# Patient Record
Sex: Female | Born: 1945 | Race: White | Hispanic: No | Marital: Married | Smoking: Former smoker
Health system: Southern US, Community
[De-identification: ages and names within clinical notes are randomized; demographics above are authoritative.]

## PROBLEM LIST (undated history)

## (undated) DIAGNOSIS — R7309 Other abnormal glucose: Secondary | ICD-10-CM

## (undated) DIAGNOSIS — R7303 Prediabetes: Secondary | ICD-10-CM

## (undated) DIAGNOSIS — E785 Hyperlipidemia, unspecified: Secondary | ICD-10-CM

## (undated) DIAGNOSIS — K219 Gastro-esophageal reflux disease without esophagitis: Secondary | ICD-10-CM

## (undated) DIAGNOSIS — I10 Essential (primary) hypertension: Secondary | ICD-10-CM

## (undated) DIAGNOSIS — E559 Vitamin D deficiency, unspecified: Secondary | ICD-10-CM

## (undated) DIAGNOSIS — N289 Disorder of kidney and ureter, unspecified: Secondary | ICD-10-CM

## (undated) DIAGNOSIS — E039 Hypothyroidism, unspecified: Secondary | ICD-10-CM

## (undated) HISTORY — PX: EYE SURGERY: SHX253

## (undated) HISTORY — DX: Hypothyroidism, unspecified: E03.9

## (undated) HISTORY — DX: Disorder of kidney and ureter, unspecified: N28.9

## (undated) HISTORY — DX: Gastro-esophageal reflux disease without esophagitis: K21.9

## (undated) HISTORY — PX: ABDOMINAL HYSTERECTOMY: SHX81

## (undated) HISTORY — DX: Hyperlipidemia, unspecified: E78.5

## (undated) HISTORY — DX: Essential (primary) hypertension: I10

## (undated) HISTORY — DX: Prediabetes: R73.03

## (undated) HISTORY — PX: NASAL SEPTUM SURGERY: SHX37

## (undated) HISTORY — DX: Other abnormal glucose: R73.09

## (undated) HISTORY — PX: TUBAL LIGATION: SHX77

## (undated) HISTORY — DX: Vitamin D deficiency, unspecified: E55.9

---

## 1997-03-26 ENCOUNTER — Ambulatory Visit (HOSPITAL_COMMUNITY): Admission: RE | Admit: 1997-03-26 | Discharge: 1997-03-26 | Payer: Self-pay | Admitting: Internal Medicine

## 1998-02-18 ENCOUNTER — Other Ambulatory Visit: Admission: RE | Admit: 1998-02-18 | Discharge: 1998-02-18 | Payer: Self-pay | Admitting: Obstetrics & Gynecology

## 1998-07-16 ENCOUNTER — Ambulatory Visit (HOSPITAL_COMMUNITY): Admission: RE | Admit: 1998-07-16 | Discharge: 1998-07-16 | Payer: Self-pay | Admitting: Internal Medicine

## 1998-07-24 ENCOUNTER — Encounter: Payer: Self-pay | Admitting: Internal Medicine

## 1998-07-24 ENCOUNTER — Ambulatory Visit (HOSPITAL_COMMUNITY): Admission: RE | Admit: 1998-07-24 | Discharge: 1998-07-24 | Payer: Self-pay | Admitting: Internal Medicine

## 1999-01-06 HISTORY — PX: CARPAL TUNNEL RELEASE: SHX101

## 1999-01-10 ENCOUNTER — Ambulatory Visit (HOSPITAL_BASED_OUTPATIENT_CLINIC_OR_DEPARTMENT_OTHER): Admission: RE | Admit: 1999-01-10 | Discharge: 1999-01-10 | Payer: Self-pay | Admitting: Orthopedic Surgery

## 2000-06-28 ENCOUNTER — Encounter: Payer: Self-pay | Admitting: Internal Medicine

## 2000-06-28 ENCOUNTER — Ambulatory Visit (HOSPITAL_COMMUNITY): Admission: RE | Admit: 2000-06-28 | Discharge: 2000-06-28 | Payer: Self-pay | Admitting: Internal Medicine

## 2003-10-25 ENCOUNTER — Ambulatory Visit (HOSPITAL_COMMUNITY): Admission: RE | Admit: 2003-10-25 | Discharge: 2003-10-25 | Payer: Self-pay | Admitting: Internal Medicine

## 2005-03-18 ENCOUNTER — Ambulatory Visit (HOSPITAL_COMMUNITY): Admission: RE | Admit: 2005-03-18 | Discharge: 2005-03-18 | Payer: Self-pay | Admitting: Internal Medicine

## 2006-06-10 ENCOUNTER — Ambulatory Visit: Payer: Self-pay | Admitting: Internal Medicine

## 2006-06-24 ENCOUNTER — Ambulatory Visit: Payer: Self-pay | Admitting: Internal Medicine

## 2006-06-24 LAB — HM COLONOSCOPY

## 2008-03-08 ENCOUNTER — Ambulatory Visit (HOSPITAL_COMMUNITY): Admission: RE | Admit: 2008-03-08 | Discharge: 2008-03-08 | Payer: Self-pay | Admitting: Internal Medicine

## 2008-03-20 ENCOUNTER — Ambulatory Visit (HOSPITAL_COMMUNITY): Admission: RE | Admit: 2008-03-20 | Discharge: 2008-03-20 | Payer: Self-pay | Admitting: Internal Medicine

## 2010-05-23 NOTE — Op Note (Signed)
. [facility]  Patient:    Stacy Wallace                        MRN: [id number] Proc. Date: 01/10/99 Adm. Date:  [id number] Attending:  Ronne Binning CC:         Nicki Reaper, M.D. (2)                           Operative Report  PREOPERATIVE DIAGNOSIS:  Carpal tunnel syndrome, right hand.  POSTOPERATIVE DIAGNOSIS:  Carpal tunnel syndrome, right hand.  OPERATION:  Decompression right median nerve.  SURGEON:  Nicki Reaper, M.D.  ASSISTANT:  RN  ANESTHESIA:  Forearm based IV regional.  ANESTHESIOLOGIST:  Katrinka Blazing.  HISTORY:  The patient is a 65 year old female with a history of a carpal tunnel  syndrome.   EMG nerve conduction is positive which have not responded to conservative treatment.  PROCEDURE:  The patient was brought to the operating room where a forearm based IV regional anesthetic was carried out without difficulty.  She was prepped and draped using Betadine scrub and solution with the right arm free.  A longitudinal incision was made in the palm and carried down through subcutaneous tissue.  Bleeders were electrocauterized.  Palmar fascia was split.  Superficial palmar arch identified. The flexor tendon of the ring and little finger identified.  To the ulnar side of the median nerve the carpal retinaculum was incised with sharp dissection.  A right angle and Sellor retractor were placed between skin and forearm fascia.  The fascia was released for approximately 3 cm proximal to the  wrist crease under direct vision.  No further lesions were identified.  The wound was irrigated and closed with interrupted 5-0 nylon sutures.  A sterile compressive dressing and splint was applied.  The patient tolerated the procedure well and as taken to the recovery room for observation in satisfactory condition.  She is discharged home to return to the hand center in [place] in 1 week on Vicodin and Keflex. DD:  01/10/99 TD:   01/10/99 Job: [id number]

## 2011-03-25 ENCOUNTER — Other Ambulatory Visit (HOSPITAL_COMMUNITY): Payer: Self-pay | Admitting: Internal Medicine

## 2011-03-25 DIAGNOSIS — Z780 Asymptomatic menopausal state: Secondary | ICD-10-CM

## 2011-03-25 DIAGNOSIS — Z1231 Encounter for screening mammogram for malignant neoplasm of breast: Secondary | ICD-10-CM

## 2011-04-06 LAB — HM DEXA SCAN

## 2011-04-20 ENCOUNTER — Ambulatory Visit (HOSPITAL_COMMUNITY)
Admission: RE | Admit: 2011-04-20 | Discharge: 2011-04-20 | Disposition: A | Discharge status: Home / Self Care | Payer: Medicare Other | Source: Ambulatory Visit | Attending: Internal Medicine | Admitting: Internal Medicine

## 2011-04-20 ENCOUNTER — Other Ambulatory Visit (HOSPITAL_COMMUNITY): Payer: Self-pay | Admitting: Internal Medicine

## 2011-04-20 DIAGNOSIS — Z1231 Encounter for screening mammogram for malignant neoplasm of breast: Secondary | ICD-10-CM

## 2011-04-20 DIAGNOSIS — Z780 Asymptomatic menopausal state: Secondary | ICD-10-CM

## 2011-04-20 DIAGNOSIS — I10 Essential (primary) hypertension: Secondary | ICD-10-CM

## 2011-04-20 DIAGNOSIS — R062 Wheezing: Secondary | ICD-10-CM | POA: Insufficient documentation

## 2012-05-02 LAB — HM DIABETES FOOT EXAM

## 2012-06-02 ENCOUNTER — Other Ambulatory Visit (HOSPITAL_COMMUNITY): Payer: Self-pay | Admitting: Internal Medicine

## 2012-06-02 DIAGNOSIS — Z1231 Encounter for screening mammogram for malignant neoplasm of breast: Secondary | ICD-10-CM

## 2012-06-03 ENCOUNTER — Ambulatory Visit (HOSPITAL_COMMUNITY)
Admission: RE | Admit: 2012-06-03 | Discharge: 2012-06-03 | Disposition: A | Discharge status: Home / Self Care | Payer: Medicare Other | Source: Ambulatory Visit | Attending: Internal Medicine | Admitting: Internal Medicine

## 2012-06-03 DIAGNOSIS — Z1231 Encounter for screening mammogram for malignant neoplasm of breast: Secondary | ICD-10-CM | POA: Insufficient documentation

## 2012-06-06 ENCOUNTER — Other Ambulatory Visit: Payer: Self-pay | Admitting: Internal Medicine

## 2012-06-06 DIAGNOSIS — R928 Other abnormal and inconclusive findings on diagnostic imaging of breast: Secondary | ICD-10-CM

## 2012-06-17 ENCOUNTER — Ambulatory Visit
Admission: RE | Admit: 2012-06-17 | Discharge: 2012-06-17 | Disposition: A | Discharge status: Home / Self Care | Payer: Medicare Other | Source: Ambulatory Visit | Attending: Internal Medicine | Admitting: Internal Medicine

## 2012-06-17 DIAGNOSIS — R928 Other abnormal and inconclusive findings on diagnostic imaging of breast: Secondary | ICD-10-CM

## 2012-06-17 LAB — HM MAMMOGRAPHY

## 2013-03-19 ENCOUNTER — Encounter: Payer: Self-pay | Admitting: Emergency Medicine

## 2013-03-20 ENCOUNTER — Encounter: Payer: Self-pay | Admitting: Emergency Medicine

## 2013-03-20 ENCOUNTER — Ambulatory Visit (INDEPENDENT_AMBULATORY_CARE_PROVIDER_SITE_OTHER): Payer: Commercial Managed Care - HMO | Admitting: Emergency Medicine

## 2013-03-20 ENCOUNTER — Ambulatory Visit
Admission: RE | Admit: 2013-03-20 | Discharge: 2013-03-20 | Disposition: A | Discharge status: Home / Self Care | Payer: Commercial Managed Care - HMO | Source: Ambulatory Visit | Attending: Emergency Medicine | Admitting: Emergency Medicine

## 2013-03-20 VITALS — BP 136/84 | HR 62 | Temp 98.0°F | Resp 18 | Ht 61.0 in | Wt 127.0 lb

## 2013-03-20 DIAGNOSIS — R7309 Other abnormal glucose: Secondary | ICD-10-CM

## 2013-03-20 DIAGNOSIS — I10 Essential (primary) hypertension: Secondary | ICD-10-CM

## 2013-03-20 DIAGNOSIS — K219 Gastro-esophageal reflux disease without esophagitis: Secondary | ICD-10-CM

## 2013-03-20 DIAGNOSIS — M25569 Pain in unspecified knee: Secondary | ICD-10-CM

## 2013-03-20 DIAGNOSIS — E785 Hyperlipidemia, unspecified: Secondary | ICD-10-CM

## 2013-03-20 DIAGNOSIS — E039 Hypothyroidism, unspecified: Secondary | ICD-10-CM

## 2013-03-20 DIAGNOSIS — E559 Vitamin D deficiency, unspecified: Secondary | ICD-10-CM

## 2013-03-20 DIAGNOSIS — E782 Mixed hyperlipidemia: Secondary | ICD-10-CM

## 2013-03-20 LAB — CBC WITH DIFFERENTIAL/PLATELET
BASOS ABS: 0 10*3/uL (ref 0.0–0.1)
BASOS PCT: 0 % (ref 0–1)
EOS ABS: 0.2 10*3/uL (ref 0.0–0.7)
Eosinophils Relative: 3 % (ref 0–5)
HCT: 40.2 % (ref 36.0–46.0)
Hemoglobin: 13.6 g/dL (ref 12.0–15.0)
Lymphocytes Relative: 46 % (ref 12–46)
Lymphs Abs: 2.4 10*3/uL (ref 0.7–4.0)
MCH: 30.1 pg (ref 26.0–34.0)
MCHC: 33.8 g/dL (ref 30.0–36.0)
MCV: 88.9 fL (ref 78.0–100.0)
Monocytes Absolute: 0.3 10*3/uL (ref 0.1–1.0)
Monocytes Relative: 6 % (ref 3–12)
NEUTROS ABS: 2.3 10*3/uL (ref 1.7–7.7)
NEUTROS PCT: 45 % (ref 43–77)
PLATELETS: 193 10*3/uL (ref 150–400)
RBC: 4.52 MIL/uL (ref 3.87–5.11)
RDW: 13.4 % (ref 11.5–15.5)
WBC: 5.2 10*3/uL (ref 4.0–10.5)

## 2013-03-20 LAB — HEMOGLOBIN A1C
HEMOGLOBIN A1C: 6 % — AB (ref <5.7)
MEAN PLASMA GLUCOSE: 126 mg/dL — AB (ref <117)

## 2013-03-20 NOTE — Progress Notes (Signed)
Subjective:    Patient ID: Stacy Wallace, female    DOB: Feb 16, 1945, 68 y.o.   MRN: [id number]  HPI Comments: 68 yo female presents for overdue 3 month F/U for HTN, Cholesterol, Pre-Dm, D. Deficient. She has not had f/u AD. She has been off samples of Crestor x 3 months. She was taking 20 mg qod. She has been eating healthy. She has been exercising more. She has been checking BP and it has been good. LAST LABS BS 115 T 281 TG 324 L 159 A1C 5.6  D 89  She has been off thyroid Rx X 6 month. She notes she has been more tired. She said she did not think about getting it refill. She denies any symptoms off meds except may fatigue or clouded thinking.  She has Hx of fall on to left side bout 15 years ago and has noted recently had more pain/ swelling/ burning x 6- 8 months in left leg. She notes the hip has felt out of place x 15 years. She notes pain is relieved with rest and lying down.    Current Outpatient Prescriptions on File Prior to Visit  Medication Sig Dispense Refill  . aspirin 81 MG tablet Take 81 mg by mouth daily.      Marland Kitchen atenolol (TENORMIN) 100 MG tablet Take 100 mg by mouth daily.      . benazepril (LOTENSIN) 20 MG tablet Take 20 mg by mouth daily.      . Cholecalciferol (D 5000) 5000 UNITS capsule Take 5,000 Units by mouth daily.      . furosemide (LASIX) 80 MG tablet Take 80 mg by mouth.      Marland Kitchen omeprazole (PRILOSEC) 20 MG capsule Take 20 mg by mouth daily.      Marland Kitchen levothyroxine (SYNTHROID, LEVOTHROID) 50 MCG tablet Take 50 mcg by mouth daily before breakfast.      . rosuvastatin (CRESTOR) 10 MG tablet Take 10 mg by mouth daily.       No current facility-administered medications on file prior to visit.   Allergies  Allergen Reactions  . Latex   . Lopid [Gemfibrozil]     headache  . Morphine And Related   . Nickel    Past Medical History  Diagnosis Date  . Hyperlipidemia   . Hypertension   . Unspecified hypothyroidism   . Other abnormal glucose   . Unspecified vitamin  D deficiency   . GERD (gastroesophageal reflux disease)      Review of Systems  Constitutional: Positive for fatigue.  Musculoskeletal: Positive for arthralgias, gait problem, joint swelling and myalgias.  Psychiatric/Behavioral: Positive for confusion.  All other systems reviewed and are negative.   BP 136/84  Pulse 62  Temp(Src) 98 F (36.7 C) (Temporal)  Resp 18  Ht 5\' 1"  (1.549 m)  Wt 127 lb (57.607 kg)  BMI 24.01 kg/m2     Objective:   Physical Exam  Nursing note and vitals reviewed. Constitutional: She is oriented to person, place, and time. She appears well-developed and well-nourished. No distress.  HENT:  Head: Normocephalic and atraumatic.  Right Ear: External ear normal.  Left Ear: External ear normal.  Nose: Nose normal.  Mouth/Throat: Oropharynx is clear and moist.  Eyes: Conjunctivae and EOM are normal.  Neck: Normal range of motion. Neck supple. No JVD present. No thyromegaly present.  Cardiovascular: Normal rate, regular rhythm, normal heart sounds and intact distal pulses.   Pulmonary/Chest: Effort normal and breath sounds normal.  Abdominal: Soft.  Bowel sounds are normal. She exhibits no distension and no mass. There is no tenderness. There is no rebound and no guarding.  Musculoskeletal: Normal range of motion. She exhibits no edema and no tenderness.  Mild discomfort with ROM of left hip, more present when rises from seated position and initiates gait  Lymphadenopathy:    She has no cervical adenopathy.  Neurological: She is alert and oriented to person, place, and time. No cranial nerve deficit.  Skin: Skin is warm and dry. No rash noted. No erythema. No pallor.  Psychiatric: She has a normal mood and affect. Her behavior is normal. Judgment normal.  She seems mildly confused with her story telling of leg HX.           Assessment & Plan:  1.  3 month F/U for HTN, Cholesterol, Pre-Dm, D. Deficient. Needs healthy diet, cardio QD and obtain  healthy weight. Check Labs, Check BP if >130/80 call office. NONcompliance due to ? Confusion vs scheduling. SX Crestor 20 mg #42 given Take AD  2. ? Left leg pain chronic with ? Edema HX not present today- check labs/ xrays, very difficult to follow discussion with patient.  3. Hypothyroid off RX-? Confusion vs early mental status changes vs IQ level vs mood changes- Continue to monitor may need further testing, no obvious decline but noticeable difficulty with history and excessive talking

## 2013-03-20 NOTE — Patient Instructions (Signed)
Hypertension Hypertension is another name for high blood pressure. High blood pressure may mean that your heart needs to work harder to pump blood. Blood pressure consists of two numbers, which includes a higher number over a lower number (example: 110/72). HOME CARE   Make lifestyle changes as told by your doctor. This may include weight loss and exercise.  Take your blood pressure medicine every day.  Limit how much salt you use.  Stop smoking if you smoke.  Do not use drugs.  Talk to your doctor if you are using decongestants or birth control pills. These medicines might make blood pressure higher.  Females should not drink more than 1 alcoholic drink per day. Males should not drink more than 2 alcoholic drinks per day.  See your doctor as told. GET HELP RIGHT AWAY IF:   You have a blood pressure reading with a top number of 180 or higher.  You get a very bad headache.  You get blurred or changing vision.  You feel confused.  You feel weak, numb, or faint.  You get chest or belly (abdominal) pain.  You throw up (vomit).  You cannot breathe very well. MAKE SURE YOU:   Understand these instructions.  Will watch your condition.  Will get help right away if you are not doing well or get worse. Document Released: 06/10/2007 Document Revised: 03/16/2011 Document Reviewed: 06/10/2007 [facility] Patient Information 2014 [place], [place]. Sciatica with Rehab The sciatic nerve runs from the back down the leg and is responsible for sensation and control of the muscles in the back (posterior) side of the thigh, lower leg, and foot. Sciatica is a condition that is characterized by inflammation of this nerve.  SYMPTOMS   Signs of nerve damage, including numbness and/or weakness along the posterior side of the lower extremity.  Pain in the back of the thigh that may also travel down the leg.  Pain that worsens when sitting for long periods of time.  Occasionally, pain in the  back or buttock. CAUSES  Inflammation of the sciatic nerve is the cause of sciatica. The inflammation is due to something irritating the nerve. Common sources of irritation include:  Sitting for long periods of time.  Direct trauma to the nerve.  Arthritis of the spine.  Herniated or ruptured disk.  Slipping of the vertebrae (spondylolithesis)  Pressure from soft tissues, such as muscles or ligament-like tissue (fascia). RISK INCREASES WITH:  Sports that place pressure or stress on the spine (football or weightlifting).  Poor strength and flexibility.  Failure to warm-up properly before activity.  Family history of low back pain or disk disorders.  Previous back injury or surgery.  Poor body mechanics, especially when lifting, or poor posture. PREVENTION   Warm up and stretch properly before activity.  Maintain physical fitness:  Strength, flexibility, and endurance.  Cardiovascular fitness.  Learn and use proper technique, especially with posture and lifting. When possible, have coach correct improper technique.  Avoid activities that place stress on the spine. PROGNOSIS If treated properly, then sciatica usually resolves within 6 weeks. However, occasionally surgery is necessary.  RELATED COMPLICATIONS   Permanent nerve damage, including pain, numbness, tingle, or weakness.  Chronic back pain.  Risks of surgery: infection, bleeding, nerve damage, or damage to surrounding tissues. TREATMENT Treatment initially involves resting from any activities that aggravate your symptoms. The use of ice and medication may help reduce pain and inflammation. The use of strengthening and stretching exercises may help reduce pain with activity.  These exercises may be performed at home or with referral to a therapist. A therapist may recommend further treatments, such as transcutaneous electronic nerve stimulation (TENS) or ultrasound. Your caregiver may recommend corticosteroid  injections to help reduce inflammation of the sciatic nerve. If symptoms persist despite non-surgical (conservative) treatment, then surgery may be recommended. MEDICATION  If pain medication is necessary, then nonsteroidal anti-inflammatory medications, such as aspirin and ibuprofen, or other minor pain relievers, such as acetaminophen, are often recommended.  Do not take pain medication for 7 days before surgery.  Prescription pain relievers may be given if deemed necessary by your caregiver. Use only as directed and only as much as you need.  Ointments applied to the skin may be helpful.  Corticosteroid injections may be given by your caregiver. These injections should be reserved for the most serious cases, because they may only be given a certain number of times. HEAT AND COLD  Cold treatment (icing) relieves pain and reduces inflammation. Cold treatment should be applied for 10 to 15 minutes every 2 to 3 hours for inflammation and pain and immediately after any activity that aggravates your symptoms. Use ice packs or massage the area with a piece of ice (ice massage).  Heat treatment may be used prior to performing the stretching and strengthening activities prescribed by your caregiver, physical therapist, or athletic trainer. Use a heat pack or soak the injury in warm water. SEEK MEDICAL CARE IF:  Treatment seems to offer no benefit, or the condition worsens.  Any medications produce adverse side effects. EXERCISES  RANGE OF MOTION (ROM) AND STRETCHING EXERCISES - Sciatica Most people with sciatic will find that their symptoms worsen with either excessive bending forward (flexion) or arching at the low back (extension). The exercises which will help resolve your symptoms will focus on the opposite motion. Your physician, physical therapist or athletic trainer will help you determine which exercises will be most helpful to resolve your low back pain. Do not complete any exercises  without first consulting with your clinician. Discontinue any exercises which worsen your symptoms until you speak to your clinician. If you have pain, numbness or tingling which travels down into your buttocks, leg or foot, the goal of the therapy is for these symptoms to move closer to your back and eventually resolve. Occasionally, these leg symptoms will get better, but your low back pain may worsen; this is typically an indication of progress in your rehabilitation. Be certain to be very alert to any changes in your symptoms and the activities in which you participated in the 24 hours prior to the change. Sharing this information with your clinician will allow him/her to most efficiently treat your condition. These exercises may help you when beginning to rehabilitate your injury. Your symptoms may resolve with or without further involvement from your physician, physical therapist or athletic trainer. While completing these exercises, remember:   Restoring tissue flexibility helps normal motion to return to the joints. This allows healthier, less painful movement and activity.  An effective stretch should be held for at least 30 seconds.  A stretch should never be painful. You should only feel a gentle lengthening or release in the stretched tissue. FLEXION RANGE OF MOTION AND STRETCHING EXERCISES: STRETCH  Flexion, Single Knee to Chest   Lie on a firm bed or floor with both legs extended in front of you.  Keeping one leg in contact with the floor, bring your opposite knee to your chest. Hold your leg in place  by either grabbing behind your thigh or at your knee.  Pull until you feel a gentle stretch in your low back. Hold __________ seconds.  Slowly release your grasp and repeat the exercise with the opposite side. Repeat __________ times. Complete this exercise __________ times per day.  STRETCH  Flexion, Double Knee to Chest  Lie on a firm bed or floor with both legs extended in front of  you.  Keeping one leg in contact with the floor, bring your opposite knee to your chest.  Tense your stomach muscles to support your back and then lift your other knee to your chest. Hold your legs in place by either grabbing behind your thighs or at your knees.  Pull both knees toward your chest until you feel a gentle stretch in your low back. Hold __________ seconds.  Tense your stomach muscles and slowly return one leg at a time to the floor. Repeat __________ times. Complete this exercise __________ times per day.  STRETCH  Low Trunk Rotation   Lie on a firm bed or floor. Keeping your legs in front of you, bend your knees so they are both pointed toward the ceiling and your feet are flat on the floor.  Extend your arms out to the side. This will stabilize your upper body by keeping your shoulders in contact with the floor.  Gently and slowly drop both knees together to one side until you feel a gentle stretch in your low back. Hold for __________ seconds.  Tense your stomach muscles to support your low back as you bring your knees back to the starting position. Repeat the exercise to the other side. Repeat __________ times. Complete this exercise __________ times per day  EXTENSION RANGE OF MOTION AND FLEXIBILITY EXERCISES: STRETCH  Extension, Prone on Elbows  Lie on your stomach on the floor, a bed will be too soft. Place your palms about shoulder width apart and at the height of your head.  Place your elbows under your shoulders. If this is too painful, stack pillows under your chest.  Allow your body to relax so that your hips drop lower and make contact more completely with the floor.  Hold this position for __________ seconds.  Slowly return to lying flat on the floor. Repeat __________ times. Complete this exercise __________ times per day.  RANGE OF MOTION  Extension, Prone Press Ups  Lie on your stomach on the floor, a bed will be too soft. Place your palms about  shoulder width apart and at the height of your head.  Keeping your back as relaxed as possible, slowly straighten your elbows while keeping your hips on the floor. You may adjust the placement of your hands to maximize your comfort. As you gain motion, your hands will come more underneath your shoulders.  Hold this position __________ seconds.  Slowly return to lying flat on the floor. Repeat __________ times. Complete this exercise __________ times per day.  STRENGTHENING EXERCISES - Sciatica  These exercises may help you when beginning to rehabilitate your injury. These exercises should be done near your "sweet spot." This is the neutral, low-back arch, somewhere between fully rounded and fully arched, that is your least painful position. When performed in this safe range of motion, these exercises can be used for people who have either a flexion or extension based injury. These exercises may resolve your symptoms with or without further involvement from your physician, physical therapist or athletic trainer. While completing these exercises, remember:  Muscles can gain both the endurance and the strength needed for everyday activities through controlled exercises.  Complete these exercises as instructed by your physician, physical therapist or athletic trainer. Progress with the resistance and repetition exercises only as your caregiver advises.  You may experience muscle soreness or fatigue, but the pain or discomfort you are trying to eliminate should never worsen during these exercises. If this pain does worsen, stop and make certain you are following the directions exactly. If the pain is still present after adjustments, discontinue the exercise until you can discuss the trouble with your clinician. STRENGTHENING Deep Abdominals, Pelvic Tilt   Lie on a firm bed or floor. Keeping your legs in front of you, bend your knees so they are both pointed toward the ceiling and your feet are flat on  the floor.  Tense your lower abdominal muscles to press your low back into the floor. This motion will rotate your pelvis so that your tail bone is scooping upwards rather than pointing at your feet or into the floor.  With a gentle tension and even breathing, hold this position for __________ seconds. Repeat __________ times. Complete this exercise __________ times per day.  STRENGTHENING  Abdominals, Crunches   Lie on a firm bed or floor. Keeping your legs in front of you, bend your knees so they are both pointed toward the ceiling and your feet are flat on the floor. Cross your arms over your chest.  Slightly tip your chin down without bending your neck.  Tense your abdominals and slowly lift your trunk high enough to just clear your shoulder blades. Lifting higher can put excessive stress on the low back and does not further strengthen your abdominal muscles.  Control your return to the starting position. Repeat __________ times. Complete this exercise __________ times per day.  STRENGTHENING  Quadruped, Opposite UE/LE Lift  Assume a hands and knees position on a firm surface. Keep your hands under your shoulders and your knees under your hips. You may place padding under your knees for comfort.  Find your neutral spine and gently tense your abdominal muscles so that you can maintain this position. Your shoulders and hips should form a rectangle that is parallel with the floor and is not twisted.  Keeping your trunk steady, lift your right hand no higher than your shoulder and then your left leg no higher than your hip. Make sure you are not holding your breath. Hold this position __________ seconds.  Continuing to keep your abdominal muscles tense and your back steady, slowly return to your starting position. Repeat with the opposite arm and leg. Repeat __________ times. Complete this exercise __________ times per day.  STRENGTHENING  Abdominals and Quadriceps, Straight Leg Raise    Lie on a firm bed or floor with both legs extended in front of you.  Keeping one leg in contact with the floor, bend the other knee so that your foot can rest flat on the floor.  Find your neutral spine, and tense your abdominal muscles to maintain your spinal position throughout the exercise.  Slowly lift your straight leg off the floor about 6 inches for a count of 15, making sure to not hold your breath.  Still keeping your neutral spine, slowly lower your leg all the way to the floor. Repeat this exercise with each leg __________ times. Complete this exercise __________ times per day. POSTURE AND BODY MECHANICS CONSIDERATIONS - Sciatica Keeping correct posture when sitting, standing or completing your activities  will reduce the stress put on different body tissues, allowing injured tissues a chance to heal and limiting painful experiences. The following are general guidelines for improved posture. Your physician or physical therapist will provide you with any instructions specific to your needs. While reading these guidelines, remember:  The exercises prescribed by your provider will help you have the flexibility and strength to maintain correct postures.  The correct posture provides the optimal environment for your joints to work. All of your joints have less wear and tear when properly supported by a spine with good posture. This means you will experience a healthier, less painful body.  Correct posture must be practiced with all of your activities, especially prolonged sitting and standing. Correct posture is as important when doing repetitive low-stress activities (typing) as it is when doing a single heavy-load activity (lifting). RESTING POSITIONS Consider which positions are most painful for you when choosing a resting position. If you have pain with flexion-based activities (sitting, bending, stooping, squatting), choose a position that allows you to rest in a less flexed posture.  You would want to avoid curling into a fetal position on your side. If your pain worsens with extension-based activities (prolonged standing, working overhead), avoid resting in an extended position such as sleeping on your stomach. Most people will find more comfort when they rest with their spine in a more neutral position, neither too rounded nor too arched. Lying on a non-sagging bed on your side with a pillow between your knees, or on your back with a pillow under your knees will often provide some relief. Keep in mind, being in any one position for a prolonged period of time, no matter how correct your posture, can still lead to stiffness. PROPER SITTING POSTURE In order to minimize stress and discomfort on your spine, you must sit with correct posture Sitting with good posture should be effortless for a healthy body. Returning to good posture is a gradual process. Many people can work toward this most comfortably by using various supports until they have the flexibility and strength to maintain this posture on their own. When sitting with proper posture, your ears will fall over your shoulders and your shoulders will fall over your hips. You should use the back of the chair to support your upper back. Your low back will be in a neutral position, just slightly arched. You may place a small pillow or folded towel at the base of your low back for support.  When working at a desk, create an environment that supports good, upright posture. Without extra support, muscles fatigue and lead to excessive strain on joints and other tissues. Keep these recommendations in mind: CHAIR:   A chair should be able to slide under your desk when your back makes contact with the back of the chair. This allows you to work closely.  The chair's height should allow your eyes to be level with the upper part of your monitor and your hands to be slightly lower than your elbows. BODY POSITION  Your feet should make contact  with the floor. If this is not possible, use a foot rest.  Keep your ears over your shoulders. This will reduce stress on your neck and low back. INCORRECT SITTING POSTURES   If you are feeling tired and unable to assume a healthy sitting posture, do not slouch or slump. This puts excessive strain on your back tissues, causing more damage and pain. Healthier options include:  Using more support, like a  lumbar pillow.  Switching tasks to something that requires you to be upright or walking.  Talking a brief walk.  Lying down to rest in a neutral-spine position. PROLONGED STANDING WHILE SLIGHTLY LEANING FORWARD  When completing a task that requires you to lean forward while standing in one place for a long time, place either foot up on a stationary 2-4 inch high object to help maintain the best posture. When both feet are on the ground, the low back tends to lose its slight inward curve. If this curve flattens (or becomes too large), then the back and your other joints will experience too much stress, fatigue more quickly and can cause pain.  CORRECT STANDING POSTURES Proper standing posture should be assumed with all daily activities, even if they only take a few moments, like when brushing your teeth. As in sitting, your ears should fall over your shoulders and your shoulders should fall over your hips. You should keep a slight tension in your abdominal muscles to brace your spine. Your tailbone should point down to the ground, not behind your body, resulting in an over-extended swayback posture.  INCORRECT STANDING POSTURES  Common incorrect standing postures include a forward head, locked knees and/or an excessive swayback. WALKING Walk with an upright posture. Your ears, shoulders and hips should all line-up. PROLONGED ACTIVITY IN A FLEXED POSITION When completing a task that requires you to bend forward at your waist or lean over a low surface, try to find a way to stabilize 3 of 4 of your  limbs. You can place a hand or elbow on your thigh or rest a knee on the surface you are reaching across. This will provide you more stability so that your muscles do not fatigue as quickly. By keeping your knees relaxed, or slightly bent, you will also reduce stress across your low back. CORRECT LIFTING TECHNIQUES DO :   Assume a wide stance. This will provide you more stability and the opportunity to get as close as possible to the object which you are lifting.  Tense your abdominals to brace your spine; then bend at the knees and hips. Keeping your back locked in a neutral-spine position, lift using your leg muscles. Lift with your legs, keeping your back straight.  Test the weight of unknown objects before attempting to lift them.  Try to keep your elbows locked down at your sides in order get the best strength from your shoulders when carrying an object.  Always ask for help when lifting heavy or awkward objects. INCORRECT LIFTING TECHNIQUES DO NOT:   Lock your knees when lifting, even if it is a small object.  Bend and twist. Pivot at your feet or move your feet when needing to change directions.  Assume that you cannot safely pick up a paperclip without proper posture. Document Released: 12/22/2004 Document Revised: 03/16/2011 Document Reviewed: 04/05/2008 [facility] Patient Information 2014 [place], [place].

## 2013-03-21 ENCOUNTER — Other Ambulatory Visit: Payer: Self-pay | Admitting: Emergency Medicine

## 2013-03-21 ENCOUNTER — Other Ambulatory Visit: Payer: Self-pay | Admitting: *Deleted

## 2013-03-21 DIAGNOSIS — R7309 Other abnormal glucose: Secondary | ICD-10-CM | POA: Insufficient documentation

## 2013-03-21 DIAGNOSIS — E559 Vitamin D deficiency, unspecified: Secondary | ICD-10-CM | POA: Insufficient documentation

## 2013-03-21 DIAGNOSIS — I10 Essential (primary) hypertension: Secondary | ICD-10-CM | POA: Insufficient documentation

## 2013-03-21 DIAGNOSIS — E782 Mixed hyperlipidemia: Secondary | ICD-10-CM | POA: Insufficient documentation

## 2013-03-21 DIAGNOSIS — K219 Gastro-esophageal reflux disease without esophagitis: Secondary | ICD-10-CM | POA: Insufficient documentation

## 2013-03-21 DIAGNOSIS — E785 Hyperlipidemia, unspecified: Secondary | ICD-10-CM

## 2013-03-21 DIAGNOSIS — E039 Hypothyroidism, unspecified: Secondary | ICD-10-CM | POA: Insufficient documentation

## 2013-03-21 LAB — BASIC METABOLIC PANEL WITH GFR
BUN: 19 mg/dL (ref 6–23)
CO2: 32 mEq/L (ref 19–32)
Calcium: 10.1 mg/dL (ref 8.4–10.5)
Chloride: 100 mEq/L (ref 96–112)
Creat: 0.96 mg/dL (ref 0.50–1.10)
GFR, EST AFRICAN AMERICAN: 69 mL/min
GFR, EST NON AFRICAN AMERICAN: 60 mL/min
GLUCOSE: 102 mg/dL — AB (ref 70–99)
POTASSIUM: 4.4 meq/L (ref 3.5–5.3)
SODIUM: 140 meq/L (ref 135–145)

## 2013-03-21 LAB — HEPATIC FUNCTION PANEL
ALBUMIN: 4.3 g/dL (ref 3.5–5.2)
ALK PHOS: 55 U/L (ref 39–117)
ALT: 15 U/L (ref 0–35)
AST: 24 U/L (ref 0–37)
BILIRUBIN INDIRECT: 0.5 mg/dL (ref 0.2–1.2)
BILIRUBIN TOTAL: 0.6 mg/dL (ref 0.2–1.2)
Bilirubin, Direct: 0.1 mg/dL (ref 0.0–0.3)
Total Protein: 6.8 g/dL (ref 6.0–8.3)

## 2013-03-21 LAB — LIPID PANEL
CHOL/HDL RATIO: 4 ratio
Cholesterol: 249 mg/dL — ABNORMAL HIGH (ref 0–200)
HDL: 62 mg/dL (ref >39)
LDL CALC: 157 mg/dL — AB (ref 0–99)
Triglycerides: 152 mg/dL — ABNORMAL HIGH (ref <150)
VLDL: 30 mg/dL (ref 0–40)

## 2013-03-21 LAB — INSULIN, FASTING: INSULIN FASTING, SERUM: 12 u[IU]/mL (ref 3–28)

## 2013-03-21 LAB — TSH: TSH: 3.876 u[IU]/mL (ref 0.350–4.500)

## 2013-03-21 MED ORDER — OMEPRAZOLE 20 MG PO CPDR: 20.0000 mg | DELAYED_RELEASE_CAPSULE | Freq: Every day | ORAL | Status: DC

## 2013-03-21 MED ORDER — LEVOTHYROXINE SODIUM 50 MCG PO TABS: 50.0000 ug | ORAL_TABLET | Freq: Every day | ORAL | Status: DC

## 2013-03-21 MED ORDER — ATENOLOL 100 MG PO TABS: 100.0000 mg | ORAL_TABLET | Freq: Every day | ORAL | Status: DC

## 2013-03-21 MED ORDER — ROSUVASTATIN CALCIUM 10 MG PO TABS: 10.0000 mg | ORAL_TABLET | Freq: Every day | ORAL | Status: DC

## 2013-03-21 MED ORDER — FUROSEMIDE 80 MG PO TABS: 80.0000 mg | ORAL_TABLET | Freq: Every day | ORAL | Status: DC

## 2013-03-21 MED ORDER — BENAZEPRIL HCL 20 MG PO TABS: 20.0000 mg | ORAL_TABLET | Freq: Every day | ORAL | Status: DC

## 2013-03-21 MED ORDER — ALPRAZOLAM 1 MG PO TABS: 1.0000 mg | ORAL_TABLET | Freq: Every day | ORAL | Status: DC

## 2013-03-21 MED ORDER — MELOXICAM 15 MG PO TABS: ORAL_TABLET | ORAL | Status: DC

## 2013-05-08 ENCOUNTER — Encounter: Payer: Self-pay | Admitting: Internal Medicine

## 2013-05-08 ENCOUNTER — Ambulatory Visit (INDEPENDENT_AMBULATORY_CARE_PROVIDER_SITE_OTHER): Payer: Commercial Managed Care - HMO | Admitting: Internal Medicine

## 2013-05-08 VITALS — BP 134/78 | HR 68 | Temp 97.7°F | Resp 16 | Ht 60.75 in | Wt 123.2 lb

## 2013-05-08 DIAGNOSIS — E559 Vitamin D deficiency, unspecified: Secondary | ICD-10-CM

## 2013-05-08 DIAGNOSIS — I10 Essential (primary) hypertension: Secondary | ICD-10-CM

## 2013-05-08 DIAGNOSIS — Z0000 Encounter for general adult medical examination without abnormal findings: Secondary | ICD-10-CM

## 2013-05-08 DIAGNOSIS — Z1212 Encounter for screening for malignant neoplasm of rectum: Secondary | ICD-10-CM

## 2013-05-08 DIAGNOSIS — E785 Hyperlipidemia, unspecified: Secondary | ICD-10-CM

## 2013-05-08 DIAGNOSIS — Z79899 Other long term (current) drug therapy: Secondary | ICD-10-CM | POA: Insufficient documentation

## 2013-05-08 DIAGNOSIS — Z789 Other specified health status: Secondary | ICD-10-CM

## 2013-05-08 DIAGNOSIS — M109 Gout, unspecified: Secondary | ICD-10-CM

## 2013-05-08 DIAGNOSIS — G4700 Insomnia, unspecified: Secondary | ICD-10-CM

## 2013-05-08 DIAGNOSIS — Z1331 Encounter for screening for depression: Secondary | ICD-10-CM

## 2013-05-08 DIAGNOSIS — R7309 Other abnormal glucose: Secondary | ICD-10-CM

## 2013-05-08 LAB — HEMOGLOBIN A1C
HEMOGLOBIN A1C: 5.6 % (ref <5.7)
MEAN PLASMA GLUCOSE: 114 mg/dL (ref <117)

## 2013-05-08 LAB — CBC WITH DIFFERENTIAL/PLATELET
BASOS ABS: 0 10*3/uL (ref 0.0–0.1)
Basophils Relative: 0 % (ref 0–1)
EOS ABS: 0.1 10*3/uL (ref 0.0–0.7)
Eosinophils Relative: 2 % (ref 0–5)
HCT: 37.3 % (ref 36.0–46.0)
Hemoglobin: 13.1 g/dL (ref 12.0–15.0)
Lymphocytes Relative: 43 % (ref 12–46)
Lymphs Abs: 2.5 10*3/uL (ref 0.7–4.0)
MCH: 31.2 pg (ref 26.0–34.0)
MCHC: 35.1 g/dL (ref 30.0–36.0)
MCV: 88.8 fL (ref 78.0–100.0)
MONOS PCT: 7 % (ref 3–12)
Monocytes Absolute: 0.4 10*3/uL (ref 0.1–1.0)
NEUTROS PCT: 48 % (ref 43–77)
Neutro Abs: 2.8 10*3/uL (ref 1.7–7.7)
Platelets: 176 10*3/uL (ref 150–400)
RBC: 4.2 MIL/uL (ref 3.87–5.11)
RDW: 13.6 % (ref 11.5–15.5)
WBC: 5.9 10*3/uL (ref 4.0–10.5)

## 2013-05-08 MED ORDER — TRAZODONE HCL 150 MG PO TABS: ORAL_TABLET | ORAL | Status: DC

## 2013-05-08 MED ORDER — ALPRAZOLAM 1 MG PO TABS: 1.0000 mg | ORAL_TABLET | Freq: Every day | ORAL | Status: DC

## 2013-05-08 MED ORDER — ROSUVASTATIN CALCIUM 40 MG PO TABS: ORAL_TABLET | ORAL | Status: DC

## 2013-05-08 NOTE — Progress Notes (Signed)
Patient ID: Stacy Wallace, female   DOB: [birth year], 68 y.o.   MRN: [id number]   Annual Screening Comprehensive Examination  This very nice 68 y.o. MWF presents for complete physical.  Patient has been followed for HTN, Prediabetes, Hyperlipidemia, and Vitamin D Deficiency.    HTN predates since 1999. Patient's BP has been controlled and today's BP: 134/78 mmHg. Patient denies any cardiac symptoms as chest pain, palpitations, shortness of breath, dizziness or ankle swelling.   Patient's hyperlipidemia is not controlled with diet and medications. Patient denies myalgias or other medication SE's. Last Lipid panel is as below.  Lab Results  Component Value Date   CHOL 249* 03/20/2013   HDL 62 03/20/2013   LDLCALC 157* 03/20/2013   TRIG 152* 03/20/2013   CHOLHDL 4.0 03/20/2013    Patient has prediabetes with A1c 5.8% in 2011 and last A1c was 6.0% in Mar 2015. Patient denies reactive hypoglycemic symptoms, visual blurring, diabetic polys, or paresthesias.    Finally, patient has history of Vitamin D Deficiency with last vitamin D 89 in Oct 2014.  Allergies  Allergen Reactions  . Latex   . Lopid [Gemfibrozil]     headache  . Morphine And Related   . Nickel    Past Medical History  Diagnosis Date  . Hyperlipidemia   . Hypertension   . Unspecified hypothyroidism   . Other abnormal glucose   . Unspecified vitamin D deficiency   . GERD (gastroesophageal reflux disease)    Past Surgical History  Procedure Laterality Date  . Tubal ligation    . Eye surgery    . Abdominal hysterectomy    . Carpal tunnel release Bilateral 2001  . Nasal septum surgery     Family History  Problem Relation Age of Onset  . Hypertension Mother   . Hyperlipidemia Mother   . Heart disease Father   . Alcohol abuse Father   . Hyperlipidemia Father   . Hypertension Father   . Heart disease Sister   . Stroke Sister   . Depression Sister   . Heart disease Brother    History  Substance Use Topics  .  Smoking status: Former Smoker    Quit date: 03/20/1988  . Smokeless tobacco: Not on file  . Alcohol Use: Yes     Comment: rare glass of wine    ROS Constitutional: Denies fever, chills, weight loss/gain, headaches, insomnia, fatigue, night sweats, and change in appetite. Eyes: Denies redness, blurred vision, diplopia, discharge, itchy, watery eyes.  ENT: Denies discharge, congestion, post nasal drip, epistaxis, sore throat, earache, hearing loss, dental pain, Tinnitus, Vertigo, Sinus pain, snoring.  Cardio: Denies chest pain, palpitations, irregular heartbeat, syncope, dyspnea, diaphoresis, orthopnea, PND, claudication, edema Respiratory: denies cough, dyspnea, DOE, pleurisy, hoarseness, laryngitis, wheezing.  Gastrointestinal: Denies dysphagia, heartburn, reflux, water brash, pain, cramps, nausea, vomiting, bloating, diarrhea, constipation, hematemesis, melena, hematochezia, jaundice, hemorrhoids Genitourinary: Denies dysuria, frequency, urgency, nocturia, hesitancy, discharge, hematuria, flank pain Breast:Breast lumps, nipple discharge, bleeding.  Musculoskeletal: Denies arthralgia, myalgia, stiffness, Jt. Swelling, pain, limp, and strain/sprain. Skin: Denies puritis, rash, hives, warts, acne, eczema, changing in skin lesion Neuro: No weakness, tremor, incoordination, spasms, paresthesia, pain Psychiatric: Denies confusion, memory loss, sensory loss. c/o more difficulty with insomnia an desires to increase Xanax dose. Endocrine: Denies change in weight, skin, hair change, nocturia, and paresthesia, diabetic polys, visual blurring, hyper / hypo glycemic episodes.  Heme/Lymph: No excessive bleeding, bruising, enlarged lymph nodes.  Physical Exam  BP 134/78  Pulse 68  Temp(Src) 97.7  F (36.5 C) (Temporal)  Resp 16  Ht 5' 0.75" (1.543 m)  Wt 123 lb 3.2 oz (55.883 kg)  BMI 23.47 kg/m2  General Appearance: Well nourished, in no apparent distress. Eyes: PERRLA, EOMs, conjunctiva no  swelling or erythema, normal fundi and vessels. Sinuses: No frontal/maxillary tenderness ENT/Mouth: EACs patent / TMs  nl. Nares clear without erythema, swelling, mucoid exudates. Oral hygiene is good. No erythema, swelling, or exudate. Tongue normal, non-obstructing. Tonsils not swollen or erythematous. Hearing normal.  Neck: Supple, thyroid normal. No bruits, nodes or JVD. Respiratory: Respiratory effort normal.  BS equal and clear bilateral without rales, rhonci, wheezing or stridor. Cardio: Heart sounds are normal with regular rate and rhythm and no murmurs, rubs or gallops. Peripheral pulses are normal and equal bilaterally without edema. No aortic or femoral bruits. Chest: symmetric with normal excursions and percussion. Breasts: Symmetric, without lumps, nipple discharge, retractions, or fibrocystic changes.  Abdomen: Flat, soft, with bowl sounds. Nontender, no guarding, rebound, hernias, masses, or organomegaly.  Lymphatics: Non tender without lymphadenopathy.  Genitourinary:  Musculoskeletal: Full ROM all peripheral extremities, joint stability, 5/5 strength, and normal gait. Skin: Warm and dry without rashes, lesions, cyanosis, clubbing or  ecchymosis.  Neuro: Cranial nerves intact, reflexes equal bilaterally. Normal muscle tone, no cerebellar symptoms. Sensation intact.  Pysch: Awake and oriented X 3, normal affect, Insight and Judgment appropriate.  Assessment and Plan  1. Annual Screening Examination 2. Hypertension  3. Hyperlipidemia 4. Pre Diabetes 5. Vitamin D Deficiency   Continue prudent diet as discussed, weight control, BP monitoring, regular exercise, and medications. Discussed med's effects and SE's. Screening labs and tests as requested with regular follow-up as recommended.   Recommended periodic "Drug Holiday" from Xanax and given Rx Trazodone to use alternate days.

## 2013-05-08 NOTE — Patient Instructions (Signed)

## 2013-05-09 LAB — HEPATIC FUNCTION PANEL
ALT: 13 U/L (ref 0–35)
AST: 20 U/L (ref 0–37)
Albumin: 4.3 g/dL (ref 3.5–5.2)
Alkaline Phosphatase: 55 U/L (ref 39–117)
BILIRUBIN DIRECT: 0.1 mg/dL (ref 0.0–0.3)
Indirect Bilirubin: 0.5 mg/dL (ref 0.2–1.2)
TOTAL PROTEIN: 6.6 g/dL (ref 6.0–8.3)
Total Bilirubin: 0.6 mg/dL (ref 0.2–1.2)

## 2013-05-09 LAB — URINALYSIS, MICROSCOPIC ONLY
Casts: NONE SEEN
Crystals: NONE SEEN

## 2013-05-09 LAB — LIPID PANEL
CHOL/HDL RATIO: 3.3 ratio
CHOLESTEROL: 166 mg/dL (ref 0–200)
HDL: 51 mg/dL (ref >39)
LDL Cholesterol: 87 mg/dL (ref 0–99)
Triglycerides: 141 mg/dL (ref <150)
VLDL: 28 mg/dL (ref 0–40)

## 2013-05-09 LAB — MICROALBUMIN / CREATININE URINE RATIO
CREATININE, URINE: 61.2 mg/dL
MICROALB UR: 0.5 mg/dL (ref 0.00–1.89)
Microalb Creat Ratio: 8.2 mg/g (ref 0.0–30.0)

## 2013-05-09 LAB — URIC ACID: URIC ACID, SERUM: 7.3 mg/dL — AB (ref 2.4–7.0)

## 2013-05-09 LAB — BASIC METABOLIC PANEL WITH GFR
BUN: 16 mg/dL (ref 6–23)
CALCIUM: 9.4 mg/dL (ref 8.4–10.5)
CO2: 27 mEq/L (ref 19–32)
Chloride: 100 mEq/L (ref 96–112)
Creat: 1.1 mg/dL (ref 0.50–1.10)
GFR, EST AFRICAN AMERICAN: 59 mL/min — AB
GFR, Est Non African American: 51 mL/min — ABNORMAL LOW
GLUCOSE: 94 mg/dL (ref 70–99)
Potassium: 4.3 mEq/L (ref 3.5–5.3)
SODIUM: 139 meq/L (ref 135–145)

## 2013-05-09 LAB — INSULIN, FASTING: Insulin fasting, serum: 13 u[IU]/mL (ref 3–28)

## 2013-05-09 LAB — VITAMIN D 25 HYDROXY (VIT D DEFICIENCY, FRACTURES): Vit D, 25-Hydroxy: 86 ng/mL (ref 30–89)

## 2013-05-09 LAB — MAGNESIUM: Magnesium: 1.9 mg/dL (ref 1.5–2.5)

## 2013-05-09 LAB — TSH: TSH: 0.921 u[IU]/mL (ref 0.350–4.500)

## 2013-06-01 ENCOUNTER — Other Ambulatory Visit (INDEPENDENT_AMBULATORY_CARE_PROVIDER_SITE_OTHER): Payer: Commercial Managed Care - HMO

## 2013-06-01 DIAGNOSIS — Z1212 Encounter for screening for malignant neoplasm of rectum: Secondary | ICD-10-CM

## 2013-06-01 LAB — POC HEMOCCULT BLD/STL (HOME/3-CARD/SCREEN)
Card #3 Fecal Occult Blood, POC: NEGATIVE
FECAL OCCULT BLD: NEGATIVE
FECAL OCCULT BLD: NEGATIVE

## 2013-07-19 ENCOUNTER — Other Ambulatory Visit: Payer: Self-pay | Admitting: Emergency Medicine

## 2013-07-19 MED ORDER — LEVOTHYROXINE SODIUM 50 MCG PO TABS: 50.0000 ug | ORAL_TABLET | Freq: Every day | ORAL | Status: DC

## 2013-07-19 MED ORDER — BENAZEPRIL HCL 20 MG PO TABS: 20.0000 mg | ORAL_TABLET | Freq: Every day | ORAL | Status: DC

## 2013-07-19 MED ORDER — MELOXICAM 15 MG PO TABS: ORAL_TABLET | ORAL | Status: DC

## 2013-07-19 MED ORDER — ATENOLOL 100 MG PO TABS: 100.0000 mg | ORAL_TABLET | Freq: Every day | ORAL | Status: DC

## 2013-08-08 ENCOUNTER — Encounter: Payer: Self-pay | Admitting: Physician Assistant

## 2013-08-08 ENCOUNTER — Ambulatory Visit (INDEPENDENT_AMBULATORY_CARE_PROVIDER_SITE_OTHER): Payer: Commercial Managed Care - HMO | Admitting: Physician Assistant

## 2013-08-08 VITALS — BP 128/70 | HR 68 | Temp 98.3°F | Resp 16 | Ht 61.0 in | Wt 126.0 lb

## 2013-08-08 DIAGNOSIS — R7309 Other abnormal glucose: Secondary | ICD-10-CM

## 2013-08-08 DIAGNOSIS — Z789 Other specified health status: Secondary | ICD-10-CM

## 2013-08-08 DIAGNOSIS — E785 Hyperlipidemia, unspecified: Secondary | ICD-10-CM

## 2013-08-08 DIAGNOSIS — E039 Hypothyroidism, unspecified: Secondary | ICD-10-CM

## 2013-08-08 DIAGNOSIS — E2839 Other primary ovarian failure: Secondary | ICD-10-CM

## 2013-08-08 DIAGNOSIS — Z79899 Other long term (current) drug therapy: Secondary | ICD-10-CM

## 2013-08-08 DIAGNOSIS — Z0000 Encounter for general adult medical examination without abnormal findings: Secondary | ICD-10-CM

## 2013-08-08 DIAGNOSIS — Z1331 Encounter for screening for depression: Secondary | ICD-10-CM

## 2013-08-08 DIAGNOSIS — I10 Essential (primary) hypertension: Secondary | ICD-10-CM

## 2013-08-08 DIAGNOSIS — E559 Vitamin D deficiency, unspecified: Secondary | ICD-10-CM

## 2013-08-08 LAB — LIPID PANEL
CHOL/HDL RATIO: 2.7 ratio
Cholesterol: 154 mg/dL (ref 0–200)
HDL: 58 mg/dL (ref >39)
LDL CALC: 77 mg/dL (ref 0–99)
Triglycerides: 97 mg/dL (ref <150)
VLDL: 19 mg/dL (ref 0–40)

## 2013-08-08 LAB — CBC WITH DIFFERENTIAL/PLATELET
Basophils Absolute: 0 10*3/uL (ref 0.0–0.1)
Basophils Relative: 0 % (ref 0–1)
Eosinophils Absolute: 0.2 10*3/uL (ref 0.0–0.7)
Eosinophils Relative: 4 % (ref 0–5)
HEMATOCRIT: 36.4 % (ref 36.0–46.0)
Hemoglobin: 12.4 g/dL (ref 12.0–15.0)
Lymphocytes Relative: 51 % — ABNORMAL HIGH (ref 12–46)
Lymphs Abs: 2.6 10*3/uL (ref 0.7–4.0)
MCH: 29.5 pg (ref 26.0–34.0)
MCHC: 34.1 g/dL (ref 30.0–36.0)
MCV: 86.7 fL (ref 78.0–100.0)
MONO ABS: 0.4 10*3/uL (ref 0.1–1.0)
Monocytes Relative: 8 % (ref 3–12)
NEUTROS PCT: 37 % — AB (ref 43–77)
Neutro Abs: 1.9 10*3/uL (ref 1.7–7.7)
Platelets: 142 10*3/uL — ABNORMAL LOW (ref 150–400)
RBC: 4.2 MIL/uL (ref 3.87–5.11)
RDW: 13.6 % (ref 11.5–15.5)
WBC: 5.1 10*3/uL (ref 4.0–10.5)

## 2013-08-08 LAB — HEPATIC FUNCTION PANEL
ALBUMIN: 4.3 g/dL (ref 3.5–5.2)
ALT: 18 U/L (ref 0–35)
AST: 29 U/L (ref 0–37)
Alkaline Phosphatase: 49 U/L (ref 39–117)
BILIRUBIN INDIRECT: 0.6 mg/dL (ref 0.2–1.2)
Bilirubin, Direct: 0.1 mg/dL (ref 0.0–0.3)
TOTAL PROTEIN: 6.6 g/dL (ref 6.0–8.3)
Total Bilirubin: 0.7 mg/dL (ref 0.2–1.2)

## 2013-08-08 LAB — HEMOGLOBIN A1C
HEMOGLOBIN A1C: 6.1 % — AB (ref <5.7)
Mean Plasma Glucose: 128 mg/dL — ABNORMAL HIGH (ref <117)

## 2013-08-08 LAB — MAGNESIUM: MAGNESIUM: 2.1 mg/dL (ref 1.5–2.5)

## 2013-08-08 LAB — BASIC METABOLIC PANEL WITH GFR
BUN: 19 mg/dL (ref 6–23)
CO2: 31 mEq/L (ref 19–32)
CREATININE: 1.2 mg/dL — AB (ref 0.50–1.10)
Calcium: 9.2 mg/dL (ref 8.4–10.5)
Chloride: 102 mEq/L (ref 96–112)
GFR, EST AFRICAN AMERICAN: 53 mL/min — AB
GFR, Est Non African American: 46 mL/min — ABNORMAL LOW
Glucose, Bld: 91 mg/dL (ref 70–99)
POTASSIUM: 3.8 meq/L (ref 3.5–5.3)
Sodium: 139 mEq/L (ref 135–145)

## 2013-08-08 LAB — TSH: TSH: 0.719 u[IU]/mL (ref 0.350–4.500)

## 2013-08-08 NOTE — Progress Notes (Signed)
MEDICARE ANNUAL WELLNESS VISIT AND FOLLOW UP  Assessment:   1. Essential hypertension - CBC with Differential - BASIC METABOLIC PANEL WITH GFR - Hepatic function panel  2. Hypothyroidism - TSH  3. Hyperlipidemia - Lipid panel  4. PreDiabetes Discussed general issues about diabetes pathophysiology and management., Educational material distributed., Suggested low cholesterol diet., Encouraged aerobic exercise., Discussed foot care., Reminded to get yearly retinal exam. - Hemoglobin A1c - HM DIABETES FOOT EXAM  5. Vitamin D Deficiency - Vit D  25 hydroxy (rtn osteoporosis monitoring)  6. Encounter for long-term (current) use of other medications - Magnesium  7. Estrogen deficiency - DG Bone Density; Future   Plan:   During the course of the visit the patient was educated and counseled about appropriate screening and preventive services including:    Pneumococcal vaccine   Influenza vaccine  Td vaccine  Screening electrocardiogram  Screening mammography  Bone densitometry screening  Colorectal cancer screening  Diabetes screening  Glaucoma screening  Nutrition counseling   Advanced directives: given info/requested  Screening recommendations, referrals:  Vaccinations: Tdap vaccine not indicated Influenza vaccine not indicated Pneumococcal vaccine not indicated Shingles vaccine will check price Hep B vaccine not indicated  Nutrition assessed and recommended  Colonoscopy up to date Mammogram uptodate Pap smear not indicated Pelvic exam not indicated Recommended yearly ophthalmology/optometry visit for glaucoma screening and checkup Recommended yearly dental visit for hygiene and checkup Advanced directives - requested  Conditions/risks identified: BMI: Discussed weight loss, diet, and increase physical activity.  Increase physical activity: AHA recommends 150 minutes of physical activity a week.  Medications reviewed DEXA- ordered Diabetes is  at goal, not on ACE, only PREDM Urinary Incontinence is not an issue: discussed non pharmacology and pharmacology options.  Fall risk: low- discussed PT, home fall assessment, medications.    Subjective:   Stacy Wallace is a 68 y.o. female who presents for Medicare Annual Wellness Visit and 3 month follow up on hypertension, prediabetes, hyperlipidemia, vitamin D def.  Date of last medicare wellness visit is unknown.   Her blood pressure has been controlled at home, today their BP is BP: 128/70 mmHg She does workout. She denies chest pain, shortness of breath, dizziness.  She is on cholesterol medication and denies myalgias. Her cholesterol is at goal. The cholesterol last visit was:   Lab Results  Component Value Date   CHOL 166 05/08/2013   HDL 51 05/08/2013   LDLCALC 87 05/08/2013   TRIG 141 05/08/2013   CHOLHDL 3.3 05/08/2013   She has been working on diet and exercise for prediabetes, and denies paresthesia of the feet, polydipsia and polyuria. Last A1C in the office was:  Lab Results  Component Value Date   HGBA1C 5.6 05/08/2013   Patient is on Vitamin D supplement. Lab Results  Component Value Date   VD25OH 21 05/08/2013     She is on thyroid medication. Her medication was not changed last visit. Patient denies nervousness, palpitations and weight changes.  Lab Results  Component Value Date   TSH 0.921 05/08/2013  .    Names of Other Physician/Practitioners you currently use: 1. [facility]- here for primary care 2. Dr. Katy Fitch eye doctor, last visit every year 3. Dr. Burgess Estelle, dentist, last visit q 6 months 4. Dr. Carlean Purl, GI Patient Care Team: Unk Pinto, MD as PCP - General (Internal Medicine)  Medication Review Current Outpatient Prescriptions on File Prior to Visit  Medication Sig Dispense Refill  . ALPRAZolam Duanne Moron)  1 MG tablet Take 1 tablet (1 mg total) by mouth at bedtime.  90 tablet  1  . aspirin 81 MG tablet Take 81 mg by  mouth daily.      Marland Kitchen atenolol (TENORMIN) 100 MG tablet Take 1 tablet (100 mg total) by mouth daily.  90 tablet  1  . benazepril (LOTENSIN) 20 MG tablet Take 1 tablet (20 mg total) by mouth daily.  90 tablet  1  . Cholecalciferol (D 5000) 5000 UNITS capsule Take 5,000 Units by mouth daily.      . furosemide (LASIX) 80 MG tablet Take 1 tablet (80 mg total) by mouth daily.  90 tablet  1  . levothyroxine (SYNTHROID, LEVOTHROID) 50 MCG tablet Take 1 tablet (50 mcg total) by mouth daily before breakfast.  90 tablet  1  . Magnesium 500 MG TABS Take 500 mg by mouth daily.      . meloxicam (MOBIC) 15 MG tablet 1 po QD/ PRN pain  90 tablet  1  . omeprazole (PRILOSEC) 20 MG capsule Take 1 capsule (20 mg total) by mouth daily.  90 capsule  1  . rosuvastatin (CRESTOR) 40 MG tablet Take 1/2 to 1 tablet daily for cholesterol as directed  90 tablet  1  . traZODone (DESYREL) 150 MG tablet Take 1/3 to 1/2 to 1 whole pill 1 hour before bedtime  90 tablet  99   No current facility-administered medications on file prior to visit.    Current Problems (verified) Patient Active Problem List   Diagnosis Date Noted  . Encounter for long-term (current) use of other medications 05/08/2013  . Hyperlipidemia   . Hypertension   . Hypothyroidism   . PreDiabetes   . Vitamin D Deficiency   . GERD (gastroesophageal reflux disease)     Screening Tests Health Maintenance  Topic Date Due  . Zostavax  03/04/2003  . Influenza Vaccine  08/05/2013  . Mammogram  06/18/2014  . Colonoscopy  06/23/2016  . Tetanus/tdap  11/08/2016  . Pneumococcal Polysaccharide Vaccine Age 62 And Over  Completed     Immunization History  Administered Date(s) Administered  . Influenza-Unspecified 09/15/2012  . Pneumococcal-Unspecified 03/18/2010  . Td 11/09/2006    Preventative care: Last colonoscopy: 2008 due 2018 Last mammogram: 06/2013 Last pap smear/pelvic exam: declines  DEXA: 2013 due 2015  Prior vaccinations: TD or Tdap:  2008  Influenza: 2014 Pneumococcal: 2012 Shingles/Zostavax: DUE  History reviewed: allergies, current medications, past family history, past medical history, past social history, past surgical history and problem list  Risk Factors: Osteoporosis: postmenopausal estrogen deficiency and dietary calcium and/or vitamin D deficiency History of fracture in the past year: no  Tobacco History  Substance Use Topics  . Smoking status: Former Smoker    Quit date: 03/20/1988  . Smokeless tobacco: Not on file  . Alcohol Use: Yes     Comment: rare glass of wine   She does not smoke.  Patient is a former smoker. Are there smokers in your home (other than you)?  No  Alcohol Current alcohol use: social drinker  Caffeine Current caffeine use: coffee 1 /day  Exercise Current exercise: none  Nutrition/Diet Current diet: in general, a "healthy" diet    Cardiac risk factors: advanced age (older than 42 for men, 30 for women), dyslipidemia, hypertension and sedentary lifestyle.  Depression Screen (Note: if answer to either of the following is "Yes", a more complete depression screening is indicated)   Q1: Over the past two weeks, have you  felt down, depressed or hopeless? No  Q2: Over the past two weeks, have you felt little interest or pleasure in doing things? No  Have you lost interest or pleasure in daily life? No  Do you often feel hopeless? No  Do you cry easily over simple problems? No  Activities of Daily Living In your present state of health, do you have any difficulty performing the following activities?:  Driving? No Managing money?  No Feeding yourself? No Getting from bed to chair? No Climbing a flight of stairs? No Preparing food and eating?: No Bathing or showering? No Getting dressed: No Getting to the toilet? No Using the toilet:No Moving around from place to place: No In the past year have you fallen or had a near fall?:No   Are you sexually active?  No  Do  you have more than one partner?  No  Vision Difficulties: No  Hearing Difficulties: No Do you often ask people to speak up or repeat themselves? No Do you experience ringing or noises in your ears? No Do you have difficulty understanding soft or whispered voices? No  Cognition  Do you feel that you have a problem with memory?No  Do you often misplace items? No  Do you feel safe at home?  Yes  Advanced directives Does patient have a [place]? Yes Does patient have a Living Will? Yes   Objective:   Blood pressure 128/70, pulse 68, temperature 98.3 F (36.8 C), resp. rate 16, height 5\' 1"  (1.549 m), weight 126 lb (57.153 kg). Body mass index is 23.82 kg/(m^2).  General appearance: alert, no distress, WD/WN,  female Cognitive Testing  Alert? Yes  Normal Appearance?Yes  Oriented to person? Yes  Place? Yes   Time? Yes  Recall of three objects?  Yes  Can perform simple calculations? Yes  Displays appropriate judgment?Yes  Can read the correct time from a watch face?Yes  HEENT: normocephalic, sclerae anicteric, TMs pearly, nares patent, no discharge or erythema, pharynx normal Oral cavity: MMM, no lesions Neck: supple, no lymphadenopathy, no thyromegaly, no masses Heart: RRR, normal S1, S2, no murmurs Lungs: CTA bilaterally, no wheezes, rhonchi, or rales Abdomen: +bs, soft, non tender, non distended, no masses, no hepatomegaly, no splenomegaly Musculoskeletal: nontender, no swelling, no obvious deformity Extremities: no edema, no cyanosis, no clubbing Pulses: 2+ symmetric, upper and lower extremities, normal cap refill Neurological: alert, oriented x 3, CN2-12 intact, strength normal upper extremities and lower extremities, sensation normal throughout, DTRs 2+ throughout, no cerebellar signs, gait normal Psychiatric: normal affect, behavior normal, pleasant  Breast: defer Gyn: defer Rectal: defer  Medicare Attestation I have personally reviewed: The  patient's medical and social history Their use of alcohol, tobacco or illicit drugs Their current medications and supplements The patient's functional ability including ADLs,fall risks, home safety risks, cognitive, and hearing and visual impairment Diet and physical activities Evidence for depression or mood disorders  The patient's weight, height, BMI, and visual acuity have been recorded in the chart.  I have made referrals, counseling, and provided education to the patient based on review of the above and I have provided the patient with a written personalized care plan for preventive services.     Vicie Mutters, PA-C   08/08/2013

## 2013-08-08 NOTE — Patient Instructions (Addendum)
Benefiber is good for constipation/diarrhea/irritable bowel syndrome, it helps with weight loss and can help lower your bad cholesterol. Please do 1-2 TBSP in the morning in water, coffee, or tea. It can take up to a month before you can see a difference with your bowel movements. It is cheapest from [employer], [employer], [employer].   Increase fluids, continue walking. I will check your magnesium and magnesium can help with constipation.   Preventative Care for Adults - Female      MAINTAIN REGULAR HEALTH EXAMS:  A routine yearly physical is a good way to check in with your primary care provider about your health and preventive screening. It is also an opportunity to share updates about your health and any concerns you have, and receive a thorough all-over exam.   Most health insurance companies pay for at least some preventative services.  Check with your health plan for specific coverages.  WHAT PREVENTATIVE SERVICES DO WOMEN NEED?  Adult women should have their weight and blood pressure checked regularly.   Women age 36 and older should have their cholesterol levels checked regularly.  Women should be screened for cervical cancer with a Pap smear and pelvic exam beginning at either age 11, or 3 years after they become sexually activity.    Breast cancer screening generally begins at age 15 with a mammogram and breast exam by your primary care provider.    Beginning at age 33 and continuing to age 45, women should be screened for colorectal cancer.  Certain people may need continued testing until age 12.  Updating vaccinations is part of preventative care.  Vaccinations help protect against diseases such as the flu.  Osteoporosis is a disease in which the bones lose minerals and strength as we age. Women ages 100 and over should discuss this with their caregivers, as should women after menopause who have other risk factors.  Lab tests are generally done as part of preventative care to screen for  anemia and blood disorders, to screen for problems with the kidneys and liver, to screen for bladder problems, to check blood sugar, and to check your cholesterol level.  Preventative services generally include counseling about diet, exercise, avoiding tobacco, drugs, excessive alcohol consumption, and sexually transmitted infections.    GENERAL RECOMMENDATIONS FOR GOOD HEALTH:  Healthy diet:  Eat a variety of foods, including fruit, vegetables, animal or vegetable protein, such as meat, fish, chicken, and eggs, or beans, lentils, tofu, and grains, such as rice.  Drink plenty of water daily.  Decrease saturated fat in the diet, avoid lots of red meat, processed foods, sweets, fast foods, and fried foods.  Exercise:  Aerobic exercise helps maintain good heart health. At least 30-40 minutes of moderate-intensity exercise is recommended. For example, a brisk walk that increases your heart rate and breathing. This should be done on most days of the week.   Find a type of exercise or a variety of exercises that you enjoy so that it becomes a part of your daily life.  Examples are running, walking, swimming, water aerobics, and biking.  For motivation and support, explore group exercise such as aerobic class, spin class, Zumba, Yoga,or  martial arts, etc.    Set exercise goals for yourself, such as a certain weight goal, walk or run in a race such as a 5k walk/run.  Speak to your primary care provider about exercise goals.  Disease prevention:  If you smoke or chew tobacco, find out from your caregiver how to quit.  It can literally save your life, no matter how long you have been a tobacco user. If you do not use tobacco, never begin.   Maintain a healthy diet and normal weight. Increased weight leads to problems with blood pressure and diabetes.   The Body Mass Index or BMI is a way of measuring how much of your body is fat. Having a BMI above 27 increases the risk of heart disease, diabetes,  hypertension, stroke and other problems related to obesity. Your caregiver can help determine your BMI and based on it develop an exercise and dietary program to help you achieve or maintain this important measurement at a healthful level.  High blood pressure causes heart and blood vessel problems.  Persistent high blood pressure should be treated with medicine if weight loss and exercise do not work.   Fat and cholesterol leaves deposits in your arteries that can block them. This causes heart disease and vessel disease elsewhere in your body.  If your cholesterol is found to be high, or if you have heart disease or certain other medical conditions, then you may need to have your cholesterol monitored frequently and be treated with medication.   Ask if you should have a cardiac stress test if your history suggests this. A stress test is a test done on a treadmill that looks for heart disease. This test can find disease prior to there being a problem.  Menopause can be associated with physical symptoms and risks. Hormone replacement therapy is available to decrease these. You should talk to your caregiver about whether starting or continuing to take hormones is right for you.   Osteoporosis is a disease in which the bones lose minerals and strength as we age. This can result in serious bone fractures. Risk of osteoporosis can be identified using a bone density scan. Women ages 66 and over should discuss this with their caregivers, as should women after menopause who have other risk factors. Ask your caregiver whether you should be taking a calcium supplement and Vitamin D, to reduce the rate of osteoporosis.   Avoid drinking alcohol in excess (more than two drinks per day).  Avoid use of street drugs. Do not share needles with anyone. Ask for professional help if you need assistance or instructions on stopping the use of alcohol, cigarettes, and/or drugs.  Brush your teeth twice a day with fluoride  toothpaste, and floss once a day. Good oral hygiene prevents tooth decay and gum disease. The problems can be painful, unattractive, and can cause other health problems. Visit your dentist for a routine oral and dental check up and preventive care every 6-12 months.   Look at your skin regularly.  Use a mirror to look at your back. Notify your caregivers of changes in moles, especially if there are changes in shapes, colors, a size larger than a pencil eraser, an irregular border, or development of new moles.  Safety:  Use seatbelts 100% of the time, whether driving or as a passenger.  Use safety devices such as hearing protection if you work in environments with loud noise or significant background noise.  Use safety glasses when doing any work that could send debris in to the eyes.  Use a helmet if you ride a bike or motorcycle.  Use appropriate safety gear for contact sports.  Talk to your caregiver about gun safety.  Use sunscreen with a SPF (or skin protection factor) of 15 or greater.  Lighter skinned people are at a greater  risk of skin cancer. Don't forget to also wear sunglasses in order to protect your eyes from too much damaging sunlight. Damaging sunlight can accelerate cataract formation.   Practice safe sex. Use condoms. Condoms are used for birth control and to help reduce the spread of sexually transmitted infections (or STIs).  Some of the STIs are gonorrhea (the clap), chlamydia, syphilis, trichomonas, herpes, HPV (human papilloma virus) and HIV (human immunodeficiency virus) which causes AIDS. The herpes, HIV and HPV are viral illnesses that have no cure. These can result in disability, cancer and death.   Keep carbon monoxide and smoke detectors in your home functioning at all times. Change the batteries every 6 months or use a model that plugs into the wall.   Vaccinations:  Stay up to date with your tetanus shots and other required immunizations. You should have a booster for  tetanus every 10 years. Be sure to get your flu shot every year, since 5%-20% of the [place] population comes down with the flu. The flu vaccine changes each year, so being vaccinated once is not enough. Get your shot in the fall, before the flu season peaks.   Other vaccines to consider:  Human Papilloma Virus or HPV causes cancer of the cervix, and other infections that can be transmitted from person to person. There is a vaccine for HPV, and females should get immunized between the ages of 73 and 59. It requires a series of 3 shots.   Pneumococcal vaccine to protect against certain types of pneumonia.  This is normally recommended for adults age 66 or older.  However, adults younger than 71 years old with certain underlying conditions such as diabetes, heart or lung disease should also receive the vaccine.  Shingles vaccine to protect against Varicella Zoster if you are older than age 12, or younger than 70 years old with certain underlying illness.  Hepatitis A vaccine to protect against a form of infection of the liver by a virus acquired from food.  Hepatitis B vaccine to protect against a form of infection of the liver by a virus acquired from blood or body fluids, particularly if you work in health care.  If you plan to travel internationally, check with your local health department for specific vaccination recommendations.  Cancer Screening:  Breast cancer screening is essential to preventive care for women. All women age 21 and older should perform a breast self-exam every month. At age 76 and older, women should have their caregiver complete a breast exam each year. Women at ages 54 and older should have a mammogram (x-ray film) of the breasts. Your caregiver can discuss how often you need mammograms.    Cervical cancer screening includes taking a Pap smear (sample of cells examined under a microscope) from the cervix (end of the uterus). It also includes testing for HPV (Human Papilloma  Virus, which can cause cervical cancer). Screening and a pelvic exam should begin at age 56, or 3 years after a woman becomes sexually active. Screening should occur every year, with a Pap smear but no HPV testing, up to age 61. After age 6, you should have a Pap smear every 3 years with HPV testing, if no HPV was found previously.   Most routine colon cancer screening begins at the age of 2. On a yearly basis, doctors may provide special easy to use take-home tests to check for hidden blood in the stool. Sigmoidoscopy or colonoscopy can detect the earliest forms of colon cancer and is  life saving. These tests use a small camera at the end of a tube to directly examine the colon. Speak to your caregiver about this at age 46, when routine screening begins (and is repeated every 5 years unless early forms of pre-cancerous polyps or small growths are found).

## 2013-08-09 LAB — VITAMIN D 25 HYDROXY (VIT D DEFICIENCY, FRACTURES): Vit D, 25-Hydroxy: 83 ng/mL (ref 30–89)

## 2013-08-18 ENCOUNTER — Telehealth: Payer: Self-pay | Admitting: *Deleted

## 2013-08-18 ENCOUNTER — Other Ambulatory Visit: Payer: Self-pay | Admitting: Physician Assistant

## 2013-08-18 MED ORDER — PREDNISONE 20 MG PO TABS: ORAL_TABLET | ORAL | Status: DC

## 2013-08-18 NOTE — Telephone Encounter (Signed)
Patient called the front staff and left a message stating concerns.  The front staff has yet to comply with EMR requirements of sending patient messages thru EP[employer].  I received a written message from the front stating patient's concerns about trouble with swollen glands, causing pain in her neck and behind ear.  States her symptoms have been occuring times 1 week.  Per Vicie Mutters, PA-C orders, Rx for Prednisone sent into [facility].  Called patient and advised of Amanda's orders.  Advised her to start Prednisone Rx AD and add Zyrtec OTC at night.  Advised her per Amanda's orders to go to an Urgent Care or ED over the weekend if symptoms of SOB or difficulty swallowing occurs.

## 2013-09-13 ENCOUNTER — Other Ambulatory Visit: Payer: Self-pay | Admitting: Internal Medicine

## 2013-09-19 ENCOUNTER — Ambulatory Visit (INDEPENDENT_AMBULATORY_CARE_PROVIDER_SITE_OTHER): Payer: Commercial Managed Care - HMO | Admitting: *Deleted

## 2013-09-19 DIAGNOSIS — Z23 Encounter for immunization: Secondary | ICD-10-CM

## 2013-11-08 ENCOUNTER — Encounter: Payer: Self-pay | Admitting: Internal Medicine

## 2013-11-08 ENCOUNTER — Ambulatory Visit (INDEPENDENT_AMBULATORY_CARE_PROVIDER_SITE_OTHER): Payer: Commercial Managed Care - HMO | Admitting: Internal Medicine

## 2013-11-08 VITALS — BP 130/80 | HR 64 | Temp 98.1°F | Resp 16 | Ht 61.0 in | Wt 128.0 lb

## 2013-11-08 DIAGNOSIS — R7309 Other abnormal glucose: Secondary | ICD-10-CM

## 2013-11-08 DIAGNOSIS — Z79899 Other long term (current) drug therapy: Secondary | ICD-10-CM

## 2013-11-08 DIAGNOSIS — M25552 Pain in left hip: Secondary | ICD-10-CM

## 2013-11-08 DIAGNOSIS — R7303 Prediabetes: Secondary | ICD-10-CM

## 2013-11-08 DIAGNOSIS — E559 Vitamin D deficiency, unspecified: Secondary | ICD-10-CM

## 2013-11-08 DIAGNOSIS — I10 Essential (primary) hypertension: Secondary | ICD-10-CM

## 2013-11-08 DIAGNOSIS — E785 Hyperlipidemia, unspecified: Secondary | ICD-10-CM

## 2013-11-08 LAB — CBC WITH DIFFERENTIAL/PLATELET
BASOS ABS: 0 10*3/uL (ref 0.0–0.1)
Basophils Relative: 0 % (ref 0–1)
EOS ABS: 0.2 10*3/uL (ref 0.0–0.7)
EOS PCT: 3 % (ref 0–5)
HCT: 36.3 % (ref 36.0–46.0)
Hemoglobin: 12.1 g/dL (ref 12.0–15.0)
Lymphocytes Relative: 40 % (ref 12–46)
Lymphs Abs: 2.1 10*3/uL (ref 0.7–4.0)
MCH: 29.2 pg (ref 26.0–34.0)
MCHC: 33.3 g/dL (ref 30.0–36.0)
MCV: 87.7 fL (ref 78.0–100.0)
Monocytes Absolute: 0.4 10*3/uL (ref 0.1–1.0)
Monocytes Relative: 8 % (ref 3–12)
Neutro Abs: 2.6 10*3/uL (ref 1.7–7.7)
Neutrophils Relative %: 49 % (ref 43–77)
Platelets: 152 10*3/uL (ref 150–400)
RBC: 4.14 MIL/uL (ref 3.87–5.11)
RDW: 14 % (ref 11.5–15.5)
WBC: 5.3 10*3/uL (ref 4.0–10.5)

## 2013-11-08 NOTE — Progress Notes (Signed)
Patient ID: Stacy Wallace, female   DOB: [birth year], 68 y.o.   MRN: [id number]   This very nice 68 y.o.MWF presents for 3 month follow up with Hypertension, Hyperlipidemia, Pre-Diabetes, Hypothyroidism  and Vitamin D Deficiency.  Today she also reports a several month hx/o of progressively worsening left hip pains   Patient is treated for HTN since 1999 & BP has been controlled at home. Today's BP: 130/80 mmHg. Patient has had no complaints of any cardiac type chest pain, palpitations, dyspnea/orthopnea/PND, dizziness, claudication, or dependent edema.   Hyperlipidemia is controlled with diet & meds. Patient denies myalgias or other med SE's. Last Lipids were at goal - Total Chol  154; HDL  58; LDL 77; Trig 97 on 08/08/2013.   Also, the patient has history ofPreDiabetes and has had no symptoms of reactive hypoglycemia, diabetic polys, paresthesias or visual blurring.  Last A1c was  6.1% on  08/08/2013.  Patient's thyroid monitoring has been in normal range.   Further, the patient also has history of Vitamin D Deficiency and supplements vitamin D without any suspected side-effects. Last vitamin D was  83 on  08/08/2013.    Medication List   aspirin 81 MG tablet  Take 81 mg by mouth daily.     atenolol 100 MG tablet  Commonly known as:  TENORMIN  Take 1 tablet (100 mg total) by mouth daily.     benazepril 20 MG tablet  Commonly known as:  LOTENSIN  Take 1 tablet (20 mg total) by mouth daily.     CRESTOR 40 MG tablet  Generic drug:  rosuvastatin  TAKE 1/2 TO 1 TABLET DAILY FOR CHOLESTEROL AS DIRECTED     D 5000 5000 UNITS capsule  Generic drug:  Cholecalciferol  Take 5,000 Units by mouth daily.     furosemide 80 MG tablet  Commonly known as:  LASIX  Take 1 tablet (80 mg total) by mouth daily.     levothyroxine 50 MCG tablet  Commonly known as:  SYNTHROID, LEVOTHROID  Take 1 tablet (50 mcg total) by mouth daily before breakfast.     Magnesium 500 MG Tabs  Take 500 mg by mouth daily.      meloxicam 15 MG tablet  Commonly known as:  MOBIC  1 po QD/ PRN pain     omeprazole 20 MG capsule  Commonly known as:  PRILOSEC  Take 1 capsule (20 mg total) by mouth daily.     traZODone 150 MG tablet  Commonly known as:  DESYREL  Take 1/3 to 1/2 to 1 whole pill 1 hour before bedtime     Allergies  Allergen Reactions  . Latex   . Lopid [Gemfibrozil]     headache  . Morphine And Related   . Nickel    PMHx:   Past Medical History  Diagnosis Date  . Hyperlipidemia   . Hypertension   . Unspecified hypothyroidism   . Other abnormal glucose   . Unspecified vitamin D deficiency   . GERD (gastroesophageal reflux disease)    Immunization History  Administered Date(s) Administered  . Influenza, High Dose Seasonal PF 09/19/2013  . Influenza-Unspecified 09/15/2012  . Pneumococcal-Unspecified 03/18/2010  . Td 11/09/2006   Past Surgical History  Procedure Laterality Date  . Tubal ligation    . Eye surgery    . Abdominal hysterectomy    . Carpal tunnel release Bilateral 2001  . Nasal septum surgery     FHx:    Reviewed / unchanged  SHx:  Reviewed / unchanged  Systems Review:  Constitutional: Denies fever, chills, wt changes, headaches, insomnia, fatigue, night sweats, change in appetite. Eyes: Denies redness, blurred vision, diplopia, discharge, itchy, watery eyes.  ENT: Denies discharge, congestion, post nasal drip, epistaxis, sore throat, earache, hearing loss, dental pain, tinnitus, vertigo, sinus pain, snoring.  CV: Denies chest pain, palpitations, irregular heartbeat, syncope, dyspnea, diaphoresis, orthopnea, PND, claudication or edema. Respiratory: denies cough, dyspnea, DOE, pleurisy, hoarseness, laryngitis, wheezing.  Gastrointestinal: Denies dysphagia, odynophagia, heartburn, reflux, water brash, abdominal pain or cramps, nausea, vomiting, bloating, diarrhea, constipation, hematemesis, melena, hematochezia  or hemorrhoids. Genitourinary: Denies dysuria,  frequency, urgency, nocturia, hesitancy, discharge, hematuria or flank pain. Musculoskeletal: c/o Lt hip pains. Skin: Denies pruritus, rash, hives, warts, acne, eczema or change in skin lesion(s). Neuro: No weakness, tremor, incoordination, spasms, paresthesia or pain. Psychiatric: Denies confusion, memory loss or sensory loss. Endo: Denies change in weight, skin or hair change.  Heme/Lymph: No excessive bleeding, bruising or enlarged lymph nodes.  Exam:  BP 130/80   Pulse 64  Temp 98.1 F   Resp 16  Ht 5\' 1"    Wt 128 lb  BMI 24.20   Appears well nourished and in no distress. Eyes: PERRLA, EOMs, conjunctiva no swelling or erythema. Sinuses: No frontal/maxillary tenderness ENT/Mouth: EAC's clear, TM's nl w/o erythema, bulging. Nares clear w/o erythema, swelling, exudates. Oropharynx clear without erythema or exudates. Oral hygiene is good. Tongue normal, non obstructing. Hearing intact.  Neck: Supple. Thyroid nl. Car 2+/2+ without bruits, nodes or JVD. Chest: Respirations nl with BS clear & equal w/o rales, rhonchi, wheezing or stridor.  Cor: Heart sounds normal w/ regular rate and rhythm without sig. murmurs, gallops, clicks, or rubs. Peripheral pulses normal and equal  without edema.  Abdomen: Soft & bowel sounds normal. Non-tender w/o guarding, rebound, hernias, masses, or organomegaly.  Lymphatics: Unremarkable.  Musculoskeletal: Full ROM all peripheral extremities, joint stability, 5/5 strength, and normal gait.  Skin: Warm, dry without exposed rashes, lesions or ecchymosis apparent.  Neuro: Cranial nerves intact, reflexes equal bilaterally. Sensory-motor testing grossly intact. Tendon reflexes grossly intact.  Pysch: Alert & oriented x 3.  Insight and judgement nl & appropriate. No ideations.  Assessment and Plan:  1. Hypertension - Continue monitor blood pressure at home. Continue diet/meds same.  2. Hyperlipidemia - Continue diet/meds, exercise,& lifestyle modifications.  Continue monitor periodic cholesterol/liver & renal functions   3. Pre-Diabetes - Continue diet, exercise, lifestyle modifications. Monitor appropriate labs.  4. Vitamin D Deficiency - Continue supplementation.  5. Left Hip Pains - Refer Ortho   Recommended regular exercise, BP monitoring, weight control, and discussed med and SE's. Recommended labs to assess and monitor clinical status. Further disposition pending results of labs.

## 2013-11-08 NOTE — Patient Instructions (Signed)

## 2013-11-09 LAB — HEPATIC FUNCTION PANEL
ALK PHOS: 49 U/L (ref 39–117)
ALT: 10 U/L (ref 0–35)
AST: 23 U/L (ref 0–37)
Albumin: 4.5 g/dL (ref 3.5–5.2)
BILIRUBIN INDIRECT: 0.5 mg/dL (ref 0.2–1.2)
Bilirubin, Direct: 0.1 mg/dL (ref 0.0–0.3)
Total Bilirubin: 0.6 mg/dL (ref 0.2–1.2)
Total Protein: 6.5 g/dL (ref 6.0–8.3)

## 2013-11-09 LAB — HEMOGLOBIN A1C
Hgb A1c MFr Bld: 6 % — ABNORMAL HIGH (ref <5.7)
Mean Plasma Glucose: 126 mg/dL — ABNORMAL HIGH (ref <117)

## 2013-11-09 LAB — BASIC METABOLIC PANEL WITH GFR
BUN: 19 mg/dL (ref 6–23)
CHLORIDE: 102 meq/L (ref 96–112)
CO2: 31 meq/L (ref 19–32)
CREATININE: 1.25 mg/dL — AB (ref 0.50–1.10)
Calcium: 9.6 mg/dL (ref 8.4–10.5)
GFR, EST NON AFRICAN AMERICAN: 44 mL/min — AB
GFR, Est African American: 50 mL/min — ABNORMAL LOW
Glucose, Bld: 92 mg/dL (ref 70–99)
POTASSIUM: 4.1 meq/L (ref 3.5–5.3)
Sodium: 140 mEq/L (ref 135–145)

## 2013-11-09 LAB — LIPID PANEL
Cholesterol: 144 mg/dL (ref 0–200)
HDL: 52 mg/dL (ref >39)
LDL CALC: 74 mg/dL (ref 0–99)
TRIGLYCERIDES: 90 mg/dL (ref <150)
Total CHOL/HDL Ratio: 2.8 Ratio
VLDL: 18 mg/dL (ref 0–40)

## 2013-11-09 LAB — INSULIN, FASTING: Insulin fasting, serum: 6.3 u[IU]/mL (ref 2.0–19.6)

## 2013-11-09 LAB — VITAMIN D 25 HYDROXY (VIT D DEFICIENCY, FRACTURES): Vit D, 25-Hydroxy: 101 ng/mL — ABNORMAL HIGH (ref 30–89)

## 2013-11-09 LAB — TSH: TSH: 0.943 u[IU]/mL (ref 0.350–4.500)

## 2013-11-09 LAB — MAGNESIUM: Magnesium: 2.2 mg/dL (ref 1.5–2.5)

## 2013-11-14 ENCOUNTER — Other Ambulatory Visit: Payer: Self-pay | Admitting: *Deleted

## 2013-11-14 MED ORDER — ALPRAZOLAM 1 MG PO TABS: 1.0000 mg | ORAL_TABLET | Freq: Every day | ORAL | Status: DC

## 2013-11-14 MED ORDER — MELOXICAM 15 MG PO TABS: ORAL_TABLET | ORAL | Status: DC

## 2013-11-14 MED ORDER — LEVOTHYROXINE SODIUM 50 MCG PO TABS: 50.0000 ug | ORAL_TABLET | Freq: Every day | ORAL | Status: DC

## 2013-11-14 MED ORDER — ALPRAZOLAM 1 MG PO TBDP: 1.0000 mg | ORAL_TABLET | Freq: Every evening | ORAL | Status: DC | PRN

## 2013-11-14 MED ORDER — ATENOLOL 100 MG PO TABS: 100.0000 mg | ORAL_TABLET | Freq: Every day | ORAL | Status: DC

## 2013-11-14 MED ORDER — BENAZEPRIL HCL 20 MG PO TABS: 20.0000 mg | ORAL_TABLET | Freq: Every day | ORAL | Status: DC

## 2013-11-14 MED ORDER — FUROSEMIDE 80 MG PO TABS: 80.0000 mg | ORAL_TABLET | Freq: Every day | ORAL | Status: DC

## 2013-11-29 ENCOUNTER — Other Ambulatory Visit: Payer: Self-pay

## 2013-11-29 MED ORDER — ESTRADIOL 1 MG PO TABS: 1.0000 mg | ORAL_TABLET | Freq: Every day | ORAL | Status: DC

## 2013-11-29 NOTE — Telephone Encounter (Signed)
Patient called and needed a short supply of Estradiol sent to [facility] local and [facility] mail order

## 2013-12-03 ENCOUNTER — Other Ambulatory Visit: Payer: Self-pay | Admitting: Physician Assistant

## 2013-12-04 ENCOUNTER — Other Ambulatory Visit: Payer: Self-pay | Admitting: *Deleted

## 2013-12-04 ENCOUNTER — Other Ambulatory Visit: Payer: Self-pay | Admitting: Internal Medicine

## 2013-12-04 DIAGNOSIS — Z780 Asymptomatic menopausal state: Secondary | ICD-10-CM

## 2013-12-04 MED ORDER — ESTRADIOL 1 MG PO TABS: 1.0000 mg | ORAL_TABLET | Freq: Every day | ORAL | Status: DC

## 2014-01-25 DIAGNOSIS — M545 Low back pain: Secondary | ICD-10-CM | POA: Diagnosis not present

## 2014-02-08 ENCOUNTER — Ambulatory Visit (INDEPENDENT_AMBULATORY_CARE_PROVIDER_SITE_OTHER): Payer: Commercial Managed Care - HMO | Admitting: Physician Assistant

## 2014-02-08 ENCOUNTER — Ambulatory Visit: Payer: Self-pay | Admitting: Physician Assistant

## 2014-02-08 VITALS — BP 128/72 | HR 56 | Temp 98.1°F | Resp 16 | Ht 61.0 in | Wt 120.0 lb

## 2014-02-08 DIAGNOSIS — I10 Essential (primary) hypertension: Secondary | ICD-10-CM | POA: Diagnosis not present

## 2014-02-08 DIAGNOSIS — F32A Depression, unspecified: Secondary | ICD-10-CM

## 2014-02-08 DIAGNOSIS — Z79899 Other long term (current) drug therapy: Secondary | ICD-10-CM

## 2014-02-08 DIAGNOSIS — G4700 Insomnia, unspecified: Secondary | ICD-10-CM

## 2014-02-08 DIAGNOSIS — E559 Vitamin D deficiency, unspecified: Secondary | ICD-10-CM

## 2014-02-08 DIAGNOSIS — F329 Major depressive disorder, single episode, unspecified: Secondary | ICD-10-CM

## 2014-02-08 DIAGNOSIS — R5382 Chronic fatigue, unspecified: Secondary | ICD-10-CM | POA: Diagnosis not present

## 2014-02-08 DIAGNOSIS — E785 Hyperlipidemia, unspecified: Secondary | ICD-10-CM | POA: Diagnosis not present

## 2014-02-08 DIAGNOSIS — R7309 Other abnormal glucose: Secondary | ICD-10-CM | POA: Diagnosis not present

## 2014-02-08 DIAGNOSIS — R7303 Prediabetes: Secondary | ICD-10-CM

## 2014-02-08 DIAGNOSIS — R350 Frequency of micturition: Secondary | ICD-10-CM

## 2014-02-08 DIAGNOSIS — E039 Hypothyroidism, unspecified: Secondary | ICD-10-CM | POA: Diagnosis not present

## 2014-02-08 LAB — HEPATIC FUNCTION PANEL
ALBUMIN: 4.5 g/dL (ref 3.5–5.2)
ALK PHOS: 54 U/L (ref 39–117)
ALT: 11 U/L (ref 0–35)
AST: 23 U/L (ref 0–37)
Bilirubin, Direct: 0.1 mg/dL (ref 0.0–0.3)
Indirect Bilirubin: 0.4 mg/dL (ref 0.2–1.2)
Total Bilirubin: 0.5 mg/dL (ref 0.2–1.2)
Total Protein: 6.8 g/dL (ref 6.0–8.3)

## 2014-02-08 LAB — CK: Total CK: 69 U/L (ref 7–177)

## 2014-02-08 LAB — CBC WITH DIFFERENTIAL/PLATELET
BASOS ABS: 0 10*3/uL (ref 0.0–0.1)
Basophils Relative: 0 % (ref 0–1)
Eosinophils Absolute: 0.1 10*3/uL (ref 0.0–0.7)
Eosinophils Relative: 2 % (ref 0–5)
HCT: 39.7 % (ref 36.0–46.0)
Hemoglobin: 13.1 g/dL (ref 12.0–15.0)
LYMPHS ABS: 2.7 10*3/uL (ref 0.7–4.0)
Lymphocytes Relative: 39 % (ref 12–46)
MCH: 29.5 pg (ref 26.0–34.0)
MCHC: 33 g/dL (ref 30.0–36.0)
MCV: 89.4 fL (ref 78.0–100.0)
MPV: 10.5 fL (ref 8.6–12.4)
Monocytes Absolute: 0.5 10*3/uL (ref 0.1–1.0)
Monocytes Relative: 7 % (ref 3–12)
NEUTROS ABS: 3.5 10*3/uL (ref 1.7–7.7)
NEUTROS PCT: 52 % (ref 43–77)
PLATELETS: 176 10*3/uL (ref 150–400)
RBC: 4.44 MIL/uL (ref 3.87–5.11)
RDW: 13.5 % (ref 11.5–15.5)
WBC: 6.8 10*3/uL (ref 4.0–10.5)

## 2014-02-08 LAB — LIPID PANEL
Cholesterol: 158 mg/dL (ref 0–200)
HDL: 61 mg/dL (ref >39)
LDL CALC: 69 mg/dL (ref 0–99)
TRIGLYCERIDES: 138 mg/dL (ref <150)
Total CHOL/HDL Ratio: 2.6 Ratio
VLDL: 28 mg/dL (ref 0–40)

## 2014-02-08 LAB — BASIC METABOLIC PANEL WITH GFR
BUN: 28 mg/dL — AB (ref 6–23)
CO2: 32 mEq/L (ref 19–32)
Calcium: 9.6 mg/dL (ref 8.4–10.5)
Chloride: 100 mEq/L (ref 96–112)
Creat: 1.39 mg/dL — ABNORMAL HIGH (ref 0.50–1.10)
GFR, EST AFRICAN AMERICAN: 44 mL/min — AB
GFR, Est Non African American: 38 mL/min — ABNORMAL LOW
Glucose, Bld: 98 mg/dL (ref 70–99)
POTASSIUM: 4.2 meq/L (ref 3.5–5.3)
Sodium: 140 mEq/L (ref 135–145)

## 2014-02-08 LAB — HEMOGLOBIN A1C
HEMOGLOBIN A1C: 5.9 % — AB (ref <5.7)
Mean Plasma Glucose: 123 mg/dL — ABNORMAL HIGH (ref <117)

## 2014-02-08 LAB — MAGNESIUM: Magnesium: 2.3 mg/dL (ref 1.5–2.5)

## 2014-02-08 MED ORDER — CITALOPRAM HYDROBROMIDE 20 MG PO TABS: 20.0000 mg | ORAL_TABLET | Freq: Every day | ORAL | Status: DC

## 2014-02-08 NOTE — Progress Notes (Signed)
Assessment and Plan:  Hypertension: Continue medication, monitor blood pressure at home. Continue DASH diet.  Reminder to go to the ER if any CP, SOB, nausea, dizziness, severe HA, changes vision/speech, left arm numbness and tingling, and jaw pain. Cholesterol: Continue diet and exercise. Check cholesterol.  Pre-diabetes-Continue diet and exercise. Check A1C Vitamin D Def- check level and continue medications.  Hypothyroidism-check TSH level, continue medications the same, reminded to take on an empty stomach 30-58mins before food.  Fatigue/shaking-? Too much thyroid medication/depression-  check for dehydration/electrolytes, TSH, ESR, ? Need colonoscopy sooner, willing to get on medication. Cut thyroid med in 1/2 3 days a week, and do lasix every other day. Try celexa 20mg  can do 1/2 a pill for 4 weeks and can go up to 1 whole pill.   Need 1 month OV follow up Continue diet and meds as discussed. Further disposition pending results of labs.  HPI 69 y.o. female  presents for 3 month follow up with hypertension, hyperlipidemia, prediabetes and vitamin D.  Her blood pressure has been controlled at home, today their BP is BP: 128/72 mmHg  She does workout. She denies chest pain, shortness of breath, dizziness.  She is on cholesterol medication, crestor 40mg  daily and denies myalgias. Her cholesterol is at goal. The cholesterol last visit was:   Lab Results  Component Value Date   CHOL 144 11/08/2013   HDL 52 11/08/2013   LDLCALC 74 11/08/2013   TRIG 90 11/08/2013   CHOLHDL 2.8 11/08/2013    She has been working on diet and exercise for prediabetes, and denies paresthesia of the feet, polydipsia, polyuria and visual disturbances. Last A1C in the office was:  Lab Results  Component Value Date   HGBA1C 6.0* 11/08/2013   Patient is on Vitamin D supplement.   Lab Results  Component Value Date   VD25OH 101* 11/08/2013     She is on thyroid medication, 51mcg daily. Her medication was not  changed last visit.   Lab Results  Component Value Date   TSH 0.943 11/08/2013  .  Last 6-8 months she has not been feeling good, she will have "the shakes" she has lost weight without trying, decreased appetite, sleeping okay, she likes to read, admits to crying recently more than usual. . She wonders if it is depression, she states she has been struggling with relationships with her daughter and husband.  Wt Readings from Last 3 Encounters:  02/08/14 120 lb (54.432 kg)  11/08/13 128 lb (58.06 kg)  08/08/13 126 lb (57.153 kg)    Current Medications:  Current Outpatient Prescriptions on File Prior to Visit  Medication Sig Dispense Refill  . ALPRAZolam (XANAX) 1 MG tablet Take 1 tablet (1 mg total) by mouth at bedtime. 90 tablet 1  . aspirin 81 MG tablet Take 81 mg by mouth daily.    Marland Kitchen atenolol (TENORMIN) 100 MG tablet Take 1 tablet (100 mg total) by mouth daily. 90 tablet 4  . benazepril (LOTENSIN) 20 MG tablet Take 1 tablet (20 mg total) by mouth daily. 90 tablet 4  . Cholecalciferol (D 5000) 5000 UNITS capsule Take 5,000 Units by mouth daily.    . CRESTOR 40 MG tablet TAKE 1/2 TO 1 TABLET DAILY FOR CHOLESTEROL AS DIRECTED 90 tablet 1  . estradiol (ESTRACE) 1 MG tablet Take 1 tablet (1 mg total) by mouth daily. 90 tablet 99  . furosemide (LASIX) 80 MG tablet Take 1 tablet (80 mg total) by mouth daily. 90 tablet 4  .  levothyroxine (SYNTHROID, LEVOTHROID) 50 MCG tablet Take 1 tablet (50 mcg total) by mouth daily before breakfast. 90 tablet 1  . Magnesium 500 MG TABS Take 500 mg by mouth daily.    . meloxicam (MOBIC) 15 MG tablet 1 po QD/ PRN pain 90 tablet 4  . omeprazole (PRILOSEC) 20 MG capsule Take 1 capsule (20 mg total) by mouth daily. 90 capsule 1   No current facility-administered medications on file prior to visit.   Medical History:  Past Medical History  Diagnosis Date  . Hyperlipidemia   . Hypertension   . Unspecified hypothyroidism   . Other abnormal glucose   .  Unspecified vitamin D deficiency   . GERD (gastroesophageal reflux disease)    Allergies:  Allergies  Allergen Reactions  . Latex   . Lopid [Gemfibrozil]     headache  . Morphine And Related   . Nickel      Review of Systems:  Review of Systems  Constitutional: Positive for malaise/fatigue. Negative for fever, chills, weight loss and diaphoresis.  HENT: Negative.   Eyes: Negative.   Respiratory: Negative.  Negative for shortness of breath.   Cardiovascular: Negative.  Negative for chest pain, claudication and leg swelling.  Gastrointestinal: Positive for diarrhea (2 x a day for 2-3 months, last colonoscopy 2008) and blood in stool (has hemorrhoids). Negative for heartburn, nausea, vomiting, abdominal pain, constipation and melena.  Genitourinary: Negative.   Musculoskeletal: Positive for back pain and joint pain (left hip pain, seeing ortho). Negative for myalgias and falls.  Skin: Negative.   Neurological: Positive for dizziness. Negative for tingling, tremors, sensory change, speech change, focal weakness, seizures, loss of consciousness and weakness.  Psychiatric/Behavioral: Positive for depression. Negative for suicidal ideas, hallucinations, memory loss and substance abuse. The patient is nervous/anxious. The patient does not have insomnia.     Family history- Review and unchanged Social history- Review and unchanged Physical Exam: BP 128/72 mmHg  Pulse 56  Temp(Src) 98.1 F (36.7 C)  Resp 16  Ht 5' 1"  (1.549 m)  Wt 120 lb (54.432 kg)  BMI 22.69 kg/m2 Wt Readings from Last 3 Encounters:  02/08/14 120 lb (54.432 kg)  11/08/13 128 lb (58.06 kg)  08/08/13 126 lb (57.153 kg)   General Appearance: Well nourished, in no apparent distress. Eyes: PERRLA, EOMs, conjunctiva no swelling or erythema Sinuses: No Frontal/maxillary tenderness ENT/Mouth: Ext aud canals clear, TMs without erythema, bulging. No erythema, swelling, or exudate on post pharynx.  Tonsils not swollen or  erythematous. Hearing normal.  Neck: Supple, thyroid normal.  Respiratory: Respiratory effort normal, BS equal bilaterally without rales, rhonchi, wheezing or stridor.  Cardio: RRR with no MRGs. Brisk peripheral pulses without edema.  Abdomen: Soft, + BS,  Non tender, no guarding, rebound, hernias, masses. Lymphatics: Non tender without lymphadenopathy.  Musculoskeletal: Full ROM, 5/5 strength, Normal gait Skin: Warm, dry without rashes, lesions, ecchymosis.  Neuro: Cranial nerves intact. Normal muscle tone, no cerebellar symptoms. Psych: Awake and oriented X 3, normal affect, Insight and Judgment appropriate.    Vicie Mutters, PA-C 1:33 PM [facility]

## 2014-02-08 NOTE — Patient Instructions (Addendum)
Cut thyroid med in 1/2 3 days a week, and 1 pill 4 days a week  do lasix every other day.  Start celexa 1/2 pill daily for 1-2 weeks and can go up to whole pill if tolerating.      Bad carbs also include fruit juice, alcohol, and sweet tea. These are empty calories that do not signal to your brain that you are full.   Please remember the good carbs are still carbs which convert into sugar. So please measure them out no more than 1/2-1 cup of rice, oatmeal, pasta, and beans  Veggies are however free foods! Pile them on.   Not all fruit is created equal. Please see the list below, the fruit at the bottom is higher in sugars than the fruit at the top. Please avoid all dried fruits.     Before you even begin to attack a weight-loss plan, it pays to remember this: You are not fat. You have fat. Losing weight isn't about blame or shame; it's simply another achievement to accomplish. Dieting is like any other skill-you have to buckle down and work at it. As long as you act in a smart, reasonable way, you'll ultimately get where you want to be. Here are some weight loss pearls for you.  1. It's Not a Diet. It's a Lifestyle Thinking of a diet as something you're on and suffering through only for the short term doesn't work. To shed weight and keep it off, you need to make permanent changes to the way you eat. It's OK to indulge occasionally, of course, but if you cut calories temporarily and then revert to your old way of eating, you'll gain back the weight quicker than you can say yo-yo. Use it to lose it. Research shows that one of the best predictors of long-term weight loss is how many pounds you drop in the first month. For that reason, nutritionists often suggest being stricter for the first two weeks of your new eating strategy to build momentum. Cut out added sugar and alcohol and avoid unrefined carbs. After that, figure out how you can reincorporate them in a way that's healthy and maintainable.   2. There's a Right Way to Exercise Working out burns calories and fat and boosts your metabolism by building muscle. But those trying to lose weight are notorious for overestimating the number of calories they burn and underestimating the amount they take in. Unfortunately, your system is biologically programmed to hold on to extra pounds and that means when you start exercising, your body senses the deficit and ramps up its hunger signals. If you're not diligent, you'll eat everything you burn and then some. Use it to lose it. Cardio gets all the exercise glory, but strength and interval training are the real heroes. They help you build lean muscle, which in turn increases your metabolism and calorie-burning ability 3. Don't Overreact to Mild Hunger Some people have a hard time losing weight because of hunger anxiety. To them, being hungry is bad-something to be avoided at all costs-so they carry snacks with them and eat when they don't need to. Others eat because they're stressed out or bored. While you never want to get to the point of being ravenous (that's when bingeing is likely to happen), a hunger pang, a craving, or the fact that it's 3:00 p.m. should not send you racing for the vending machine or obsessing about the energy bar in your purse. Ideally, you should put off eating until  your stomach is growling and it's difficult to concentrate.  Use it to lose it. When you feel the urge to eat, use the HALT method. Ask yourself, Am I really hungry? Or am I angry or anxious, lonely or bored, or tired? If you're still not certain, try the apple test. If you're truly hungry, an apple should seem delicious; if it doesn't, something else is going on. Or you can try drinking water and making yourself busy, if you are still hungry try a healthy snack.  4. Not All Calories Are Created Equal The mechanics of weight loss are pretty simple: Take in fewer calories than you use for energy. But the kind of food you  eat makes all the difference. Processed food that's high in saturated fat and refined starch or sugar can cause inflammation that disrupts the hormone signals that tell your brain you're full. The result: You eat a lot more.  Use it to lose it. Clean up your diet. Swap in whole, unprocessed foods, including vegetables, lean protein, and healthy fats that will fill you up and give you the biggest nutritional bang for your calorie buck. In a few weeks, as your brain starts receiving regular hunger and fullness signals once again, you'll notice that you feel less hungry overall and naturally start cutting back on the amount you eat.  5. Protein, Produce, and Plant-Based Fats Are Your Weight-Loss Trinity Here's why eating the three Ps regularly will help you drop pounds. Protein fills you up. You need it to build lean muscle, which keeps your metabolism humming so that you can torch more fat. People in a weight-loss program who ate double the recommended daily allowance for protein (about 110 grams for a 150-pound woman) lost 70 percent of their weight from fat, while people who ate the RDA lost only about 40 percent, one study found. Produce is packed with filling fiber. "It's very difficult to consume too many calories if you're eating a lot of vegetables. Example: Three cups of broccoli is a lot of food, yet only 93 calories. (Fruit is another story. It can be easy to overeat and can contain a lot of calories from sugar, so be sure to monitor your intake.) Plant-based fats like olive oil and those in avocados and nuts are healthy and extra satiating.  Use it to lose it. Aim to incorporate each of the three Ps into every meal and snack. People who eat protein throughout the day are able to keep weight off, according to a study in the [place] of Clinical Nutrition. In addition to meat, poultry and seafood, good sources are beans, lentils, eggs, tofu, and yogurt. As for fat, keep portion sizes in check  by measuring out salad dressing, oil, and nut butters (shoot for one to two tablespoons). Finally, eat veggies or a little fruit at every meal. People who did that consumed 308 fewer calories but didn't feel any hungrier than when they didn't eat more produce.  7. How You Eat Is As Important As What You Eat In order for your brain to register that you're full, you need to focus on what you're eating. Sit down whenever you eat, preferably at a table. Turn off the TV or computer, put down your phone, and look at your food. Smell it. Chew slowly, and don't put another bite on your fork until you swallow. When women ate lunch this attentively, they consumed 30 percent less when snacking later than those who listened to an [place] at  lunchtime, according to a study in the [place] of Nutrition. 8. Weighing Yourself Really Works The scale provides the best evidence about whether your efforts are paying off. Seeing the numbers tick up or down or stagnate is motivation to keep going-or to rethink your approach. A 2015 study at [facility] found that daily weigh-ins helped people lose more weight, keep it off, and maintain that loss, even after two years. Use it to lose it. Step on the scale at the same time every day for the best results. If your weight shoots up several pounds from one weigh-in to the next, don't freak out. Eating a lot of salt the night before or having your period is the likely culprit. The number should return to normal in a day or two. It's a steady climb that you need to do something about. 9. Too Much Stress and Too Little Sleep Are Your Enemies When you're tired and frazzled, your body cranks up the production of cortisol, the stress hormone that can cause carb cravings. Not getting enough sleep also boosts your levels of ghrelin, a hormone associated with hunger, while suppressing leptin, a hormone that signals fullness and satiety. People on a diet who slept only five and a  half hours a night for two weeks lost 55 percent less fat and were hungrier than those who slept eight and a half hours, according to a study in the [place]. Use it to lose it. Prioritize sleep, aiming for seven hours or more a night, which research shows helps lower stress. And make sure you're getting quality zzz's. If a snoring spouse or a fidgety cat wakes you up frequently throughout the night, you may end up getting the equivalent of just four hours of sleep, according to a study from [facility]. Keep pets out of the bedroom, and use a white-noise app to drown out snoring. 10. You Will Hit a plateau-And You Can Bust Through It As you slim down, your body releases much less leptin, the fullness hormone.  If you're not strength training, start right now. Building muscle can raise your metabolism to help you overcome a plateau. To keep your body challenged and burning calories, incorporate new moves and more intense intervals into your workouts or add another sweat session to your weekly routine. Alternatively, cut an extra 100 calories or so a day from your diet. Now that you've lost weight, your body simply doesn't need as much fuel.

## 2014-02-09 LAB — URINALYSIS, ROUTINE W REFLEX MICROSCOPIC
Bilirubin Urine: NEGATIVE
Glucose, UA: NEGATIVE mg/dL
Ketones, ur: NEGATIVE mg/dL
LEUKOCYTES UA: NEGATIVE
Nitrite: NEGATIVE
Protein, ur: 30 mg/dL — AB
Specific Gravity, Urine: 1.012 (ref 1.005–1.030)
UROBILINOGEN UA: 0.2 mg/dL (ref 0.0–1.0)
pH: 6.5 (ref 5.0–8.0)

## 2014-02-09 LAB — URINALYSIS, MICROSCOPIC ONLY
CASTS: NONE SEEN
CRYSTALS: NONE SEEN

## 2014-02-09 LAB — TSH: TSH: 1.273 u[IU]/mL (ref 0.350–4.500)

## 2014-02-09 LAB — VITAMIN D 25 HYDROXY (VIT D DEFICIENCY, FRACTURES): Vit D, 25-Hydroxy: 85 ng/mL (ref 30–100)

## 2014-02-10 LAB — URINE CULTURE
Colony Count: NO GROWTH
ORGANISM ID, BACTERIA: NO GROWTH

## 2014-02-12 ENCOUNTER — Other Ambulatory Visit: Payer: Self-pay | Admitting: Internal Medicine

## 2014-02-12 DIAGNOSIS — M545 Low back pain: Secondary | ICD-10-CM

## 2014-02-12 DIAGNOSIS — M5137 Other intervertebral disc degeneration, lumbosacral region: Secondary | ICD-10-CM

## 2014-02-12 DIAGNOSIS — M5442 Lumbago with sciatica, left side: Secondary | ICD-10-CM | POA: Diagnosis not present

## 2014-03-05 ENCOUNTER — Telehealth: Payer: Self-pay | Admitting: Internal Medicine

## 2014-03-05 DIAGNOSIS — M5416 Radiculopathy, lumbar region: Secondary | ICD-10-CM | POA: Diagnosis not present

## 2014-03-05 NOTE — Telephone Encounter (Signed)
Mendel Ryder,  [facility] has questions about referral request.   Thank you, [facility] [phone] Fax [phone]

## 2014-03-08 NOTE — Telephone Encounter (Signed)
Spoke with [facility] about patient's referral. There were 2 Dr.Wang's in system and I entered wrong physician. Referral was corrected and was approved for patient to see Dr.Wang at [place]. Patient aware.

## 2014-03-12 ENCOUNTER — Encounter: Payer: Self-pay | Admitting: Physician Assistant

## 2014-03-12 ENCOUNTER — Ambulatory Visit (INDEPENDENT_AMBULATORY_CARE_PROVIDER_SITE_OTHER): Payer: Commercial Managed Care - HMO | Admitting: Physician Assistant

## 2014-03-12 VITALS — BP 128/72 | HR 60 | Temp 97.7°F | Resp 16 | Ht 61.0 in | Wt 117.0 lb

## 2014-03-12 DIAGNOSIS — E039 Hypothyroidism, unspecified: Secondary | ICD-10-CM | POA: Diagnosis not present

## 2014-03-12 DIAGNOSIS — M5136 Other intervertebral disc degeneration, lumbar region: Secondary | ICD-10-CM

## 2014-03-12 DIAGNOSIS — R319 Hematuria, unspecified: Secondary | ICD-10-CM | POA: Diagnosis not present

## 2014-03-12 DIAGNOSIS — M51369 Other intervertebral disc degeneration, lumbar region without mention of lumbar back pain or lower extremity pain: Secondary | ICD-10-CM

## 2014-03-12 DIAGNOSIS — R609 Edema, unspecified: Secondary | ICD-10-CM | POA: Diagnosis not present

## 2014-03-12 LAB — BASIC METABOLIC PANEL WITH GFR
BUN: 25 mg/dL — ABNORMAL HIGH (ref 6–23)
CHLORIDE: 98 meq/L (ref 96–112)
CO2: 33 meq/L — AB (ref 19–32)
Calcium: 9.7 mg/dL (ref 8.4–10.5)
Creat: 1.3 mg/dL — ABNORMAL HIGH (ref 0.50–1.10)
GFR, EST AFRICAN AMERICAN: 48 mL/min — AB
GFR, EST NON AFRICAN AMERICAN: 41 mL/min — AB
Glucose, Bld: 96 mg/dL (ref 70–99)
Potassium: 4.1 mEq/L (ref 3.5–5.3)
SODIUM: 138 meq/L (ref 135–145)

## 2014-03-12 LAB — TSH: TSH: 2.785 u[IU]/mL (ref 0.350–4.500)

## 2014-03-12 NOTE — Progress Notes (Signed)
Assessment and Plan: Hypothyroidism- has stopped completely, will recheck, likely will need 1/2 daily.  Edema- try lasix every other day, discussed how edema works, get compression stockings, increase walking, and elevate legs.  Proteinuria/hematuria- will recheck urine, history of smoking, if still with hematuria will refer to urology to rule out uterine cancer Left hip/back pain- continue follow up Dr. Mina Marble  HPI 69 y.o.female presents for 1 month follow up. She was seen for her 3 month OV on 02/08/2014 and was complaining of shakiness, fatigue. She stopped her TSH on her own, her TSH was 1.273. She was dehydrated last visit, she is on lasix 80 mg 1/2 pill daily. She states she is constantly thirsty, she is on fluid pill for swelling but has no weight gain, SOB, PND, orthopnea with it. Also had hematuria and proteinuria last visit, will recheck. History of smoking for 60 pack year smoking history, quit in 1990.  Past Medical History  Diagnosis Date  . Hyperlipidemia   . Hypertension   . Unspecified hypothyroidism   . Other abnormal glucose   . Unspecified vitamin D deficiency   . GERD (gastroesophageal reflux disease)      Allergies  Allergen Reactions  . Latex   . Lopid [Gemfibrozil]     headache  . Morphine And Related   . Nickel       Current Outpatient Prescriptions on File Prior to Visit  Medication Sig Dispense Refill  . ALPRAZolam (XANAX) 1 MG tablet Take 1 tablet (1 mg total) by mouth at bedtime. 90 tablet 1  . aspirin 81 MG tablet Take 81 mg by mouth daily.    Marland Kitchen atenolol (TENORMIN) 100 MG tablet Take 1 tablet (100 mg total) by mouth daily. 90 tablet 4  . benazepril (LOTENSIN) 20 MG tablet Take 1 tablet (20 mg total) by mouth daily. 90 tablet 4  . Cholecalciferol (D 5000) 5000 UNITS capsule Take 5,000 Units by mouth daily.    . citalopram (CELEXA) 20 MG tablet Take 1 tablet (20 mg total) by mouth daily. 30 tablet 2  . CRESTOR 40 MG tablet TAKE 1/2 TO 1 TABLET DAILY FOR  CHOLESTEROL AS DIRECTED 90 tablet 1  . estradiol (ESTRACE) 1 MG tablet Take 1 tablet (1 mg total) by mouth daily. 90 tablet 99  . furosemide (LASIX) 80 MG tablet Take 1 tablet (80 mg total) by mouth daily. 90 tablet 4  . levothyroxine (SYNTHROID, LEVOTHROID) 50 MCG tablet Take 1 tablet (50 mcg total) by mouth daily before breakfast. 90 tablet 1  . Magnesium 500 MG TABS Take 500 mg by mouth daily.    . meloxicam (MOBIC) 15 MG tablet 1 po QD/ PRN pain 90 tablet 4  . omeprazole (PRILOSEC) 20 MG capsule Take 1 capsule (20 mg total) by mouth daily. 90 capsule 1   No current facility-administered medications on file prior to visit.    ROS: all negative except above.   Physical Exam: Filed Weights   03/12/14 1338  Weight: 117 lb (53.071 kg)   BP 128/72 mmHg  Pulse 60  Temp(Src) 97.7 F (36.5 C)  Resp 16  Ht 5\' 1"  (1.549 m)  Wt 117 lb (53.071 kg)  BMI 22.12 kg/m2 General Appearance: Well nourished, in no apparent distress. Eyes: PERRLA, EOMs, conjunctiva no swelling or erythema Sinuses: No Frontal/maxillary tenderness ENT/Mouth: Ext aud canals clear, TMs without erythema, bulging. No erythema, swelling, or exudate on post pharynx.  Tonsils not swollen or erythematous. Hearing normal.  Neck: Supple, thyroid normal.  Respiratory: Respiratory effort normal, BS equal bilaterally without rales, rhonchi, wheezing or stridor.  Cardio: RRR with no MRGs. Brisk peripheral pulses without edema.  Abdomen: Soft, + BS.  Non tender, no guarding, rebound, hernias, masses. Lymphatics: Non tender without lymphadenopathy.  Musculoskeletal: Full ROM, 5/5 strength, normal gait.  Skin: Warm, dry without rashes, lesions, ecchymosis.  Neuro: Cranial nerves intact. Normal muscle tone, no cerebellar symptoms. Sensation intact.  Psych: Awake and oriented X 3, normal affect, Insight and Judgment appropriate.     Vicie Mutters, PA-C 1:48 PM [facility]

## 2014-03-12 NOTE — Patient Instructions (Addendum)
Try to do the lasix 1/2 pill every other day.  Do the thyroid 1/2 pill daily.   Varicose Veins Varicose veins are veins that have become enlarged and twisted. CAUSES This condition is the result of valves in the veins not working properly. Valves in the veins help return blood from the leg to the heart. When your calf muscles squeeze, the blood moves up your leg then the valves close and this continues until the blood gets back to your heart.  If these valves are damaged, blood flows backwards and backs up into the veins in the leg near the skin OR if your are sitting/standing for a long time without using your calf muscles the blood will back up into the veins in your legs. This causes the veins to become larger. People who are on their feet a lot, sit a lot without walking (like on a plane, at a desk, or in a car), who are pregnant, or who are overweight are more likely to develop varicose veins. SYMPTOMS   Bulging, twisted-appearing, bluish veins, most commonly found on the legs.  Leg pain or a feeling of heaviness. These symptoms may be worse at the end of the day.  Leg swelling.  Skin color changes. DIAGNOSIS  Varicose veins can usually be diagnosed with an exam of your legs by your caregiver. He or she may recommend an ultrasound of your leg veins. TREATMENT  Most varicose veins can be treated at home.However, other treatments are available for people who have persistent symptoms or who want to treat the cosmetic appearance of the varicose veins. But this is only cosmetic and they will return if not properly treated. These include:  Laser treatment of very small varicose veins.  Medicine that is shot (injected) into the vein. This medicine hardens the walls of the vein and closes off the vein. This treatment is called sclerotherapy. Afterwards, you may need to wear clothing or bandages that apply pressure.  Surgery. HOME CARE INSTRUCTIONS   Do not stand or sit in one position for  long periods of time. Do not sit with your legs crossed. Rest with your legs raised during the day.  Your legs have to be higher than your heart so that gravity will force the valves to open, so please really elevate your legs.   Wear elastic stockings or support hose. Do not wear other tight, encircling garments around the legs, pelvis, or waist.  [employer]  has a wide variety of well priced compression stockings. [place], [place] [address] [phone]  Walk as much as possible to increase blood flow.  Raise the foot of your bed at night with 2-inch blocks.  If you get a cut in the skin over the vein and the vein bleeds, lie down with your leg raised and press on it with a clean cloth until the bleeding stops. Then place a bandage (dressing) on the cut. See your caregiver if it continues to bleed or needs stitches. SEEK MEDICAL CARE IF:   The skin around your ankle starts to break down.  You have pain, redness, tenderness, or hard swelling developing in your leg over a vein.  You are uncomfortable due to leg pain. Document Released: 10/01/2004 Document Revised: 03/16/2011 Document Reviewed: 02/17/2010 [facility] Patient Information 2014 [place].  Edema Edema is an abnormal buildup of fluids in your bodytissues. Edema is somewhatdependent on gravity to pull the fluid to the lowest place in your body. That makes  the condition more common in the legs and thighs (lower extremities). Painless swelling of the feet and ankles is common and becomes more likely as you get older. It is also common in looser tissues, like around your eyes.  When the affected area is squeezed, the fluid may move out of that spot and leave a dent for a few moments. This dent is called pitting.  CAUSES  There are many possible causes of edema. Eating too much salt and being on your feet or sitting for a long time can cause edema in your legs and ankles. Hot weather may make edema worse.  Common medical causes of edema include:  Heart failure.  Liver disease.  Kidney disease.  Weak blood vessels in your legs.  Cancer.  An injury.  Pregnancy.  Some medications.  Obesity. SYMPTOMS  Edema is usually painless.Your skin may look swollen or shiny.  DIAGNOSIS  Your health care provider may be able to diagnose edema by asking about your medical history and doing a physical exam. You may need to have tests such as X-rays, an electrocardiogram, or blood tests to check for medical conditions that may cause edema.  TREATMENT  Edema treatment depends on the cause. If you have heart, liver, or kidney disease, you need the treatment appropriate for these conditions. General treatment may include:  Elevation of the affected body part above the level of your heart.  Compression of the affected body part. Pressure from elastic bandages or support stockings squeezes the tissues and forces fluid back into the blood vessels. This keeps fluid from entering the tissues.  Restriction of fluid and salt intake.  Use of a water pill (diuretic). These medications are appropriate only for some types of edema. They pull fluid out of your body and make you urinate more often. This gets rid of fluid and reduces swelling, but diuretics can have side effects. Only use diuretics as directed by your health care provider. HOME CARE INSTRUCTIONS   Keep the affected body part above the level of your heart when you are lying down.   Do not sit still or stand for prolonged periods.   Do not put anything directly under your knees when lying down.  Do not wear constricting clothing or garters on your upper legs.   Exercise your legs to work the fluid back into your blood vessels. This may help the swelling go down.   Wear elastic bandages or support stockings to reduce ankle swelling as directed by your health care provider.   Eat a low-salt diet to reduce fluid if your health care  provider recommends it.   Only take medicines as directed by your health care provider. SEEK MEDICAL CARE IF:   Your edema is not responding to treatment.  You have heart, liver, or kidney disease and notice symptoms of edema.  You have edema in your legs that does not improve after elevating them.   You have sudden and unexplained weight gain. SEEK IMMEDIATE MEDICAL CARE IF:   You develop shortness of breath or chest pain.   You cannot breathe when you lie down.  You develop pain, redness, or warmth in the swollen areas.   You have heart, liver, or kidney disease and suddenly get edema.  You have a fever and your symptoms suddenly get worse. MAKE SURE YOU:   Understand these instructions.  Will watch your condition.  Will get help right away if you are not doing well or get worse. Document Released: 12/22/2004  Document Revised: 05/08/2013 Document Reviewed: 10/14/2012 [facility] Patient Information 2015 [place], [place]. This information is not intended to replace advice given to you by your health care provider. Make sure you discuss any questions you have with your health care provider.

## 2014-03-13 LAB — URINALYSIS, ROUTINE W REFLEX MICROSCOPIC
Bilirubin Urine: NEGATIVE
GLUCOSE, UA: NEGATIVE mg/dL
Hgb urine dipstick: NEGATIVE
Ketones, ur: NEGATIVE mg/dL
LEUKOCYTES UA: NEGATIVE
Nitrite: NEGATIVE
PH: 7 (ref 5.0–8.0)
Protein, ur: NEGATIVE mg/dL
Specific Gravity, Urine: 1.007 (ref 1.005–1.030)
Urobilinogen, UA: 0.2 mg/dL (ref 0.0–1.0)

## 2014-03-14 LAB — URINE CULTURE
COLONY COUNT: NO GROWTH
Organism ID, Bacteria: NO GROWTH

## 2014-03-27 DIAGNOSIS — M5416 Radiculopathy, lumbar region: Secondary | ICD-10-CM | POA: Diagnosis not present

## 2014-03-30 ENCOUNTER — Other Ambulatory Visit: Payer: Self-pay | Admitting: Emergency Medicine

## 2014-04-13 DIAGNOSIS — M549 Dorsalgia, unspecified: Secondary | ICD-10-CM | POA: Diagnosis not present

## 2014-04-17 ENCOUNTER — Other Ambulatory Visit: Payer: Self-pay | Admitting: Internal Medicine

## 2014-05-01 DIAGNOSIS — M7072 Other bursitis of hip, left hip: Secondary | ICD-10-CM | POA: Diagnosis not present

## 2014-05-10 ENCOUNTER — Encounter: Payer: Self-pay | Admitting: Internal Medicine

## 2014-05-29 ENCOUNTER — Other Ambulatory Visit: Payer: Self-pay | Admitting: Internal Medicine

## 2014-06-07 ENCOUNTER — Ambulatory Visit (INDEPENDENT_AMBULATORY_CARE_PROVIDER_SITE_OTHER): Payer: Commercial Managed Care - HMO | Admitting: Internal Medicine

## 2014-06-07 ENCOUNTER — Other Ambulatory Visit: Payer: Self-pay | Admitting: *Deleted

## 2014-06-07 ENCOUNTER — Encounter: Payer: Self-pay | Admitting: Internal Medicine

## 2014-06-07 VITALS — BP 114/80 | HR 64 | Temp 97.2°F | Resp 16 | Ht 60.25 in | Wt 113.8 lb

## 2014-06-07 DIAGNOSIS — I10 Essential (primary) hypertension: Secondary | ICD-10-CM

## 2014-06-07 DIAGNOSIS — K219 Gastro-esophageal reflux disease without esophagitis: Secondary | ICD-10-CM

## 2014-06-07 DIAGNOSIS — G894 Chronic pain syndrome: Secondary | ICD-10-CM

## 2014-06-07 DIAGNOSIS — M5136 Other intervertebral disc degeneration, lumbar region: Secondary | ICD-10-CM

## 2014-06-07 DIAGNOSIS — M51369 Other intervertebral disc degeneration, lumbar region without mention of lumbar back pain or lower extremity pain: Secondary | ICD-10-CM

## 2014-06-07 DIAGNOSIS — Z79899 Other long term (current) drug therapy: Secondary | ICD-10-CM | POA: Diagnosis not present

## 2014-06-07 DIAGNOSIS — E039 Hypothyroidism, unspecified: Secondary | ICD-10-CM

## 2014-06-07 DIAGNOSIS — E559 Vitamin D deficiency, unspecified: Secondary | ICD-10-CM | POA: Diagnosis not present

## 2014-06-07 DIAGNOSIS — E785 Hyperlipidemia, unspecified: Secondary | ICD-10-CM | POA: Diagnosis not present

## 2014-06-07 DIAGNOSIS — R7303 Prediabetes: Secondary | ICD-10-CM

## 2014-06-07 DIAGNOSIS — M7072 Other bursitis of hip, left hip: Secondary | ICD-10-CM

## 2014-06-07 DIAGNOSIS — R7309 Other abnormal glucose: Secondary | ICD-10-CM | POA: Diagnosis not present

## 2014-06-07 DIAGNOSIS — Z1212 Encounter for screening for malignant neoplasm of rectum: Secondary | ICD-10-CM

## 2014-06-07 DIAGNOSIS — Z9181 History of falling: Secondary | ICD-10-CM

## 2014-06-07 DIAGNOSIS — Z1331 Encounter for screening for depression: Secondary | ICD-10-CM

## 2014-06-07 LAB — CBC WITH DIFFERENTIAL/PLATELET
Basophils Absolute: 0 10*3/uL (ref 0.0–0.1)
Basophils Relative: 0 % (ref 0–1)
EOS ABS: 0.1 10*3/uL (ref 0.0–0.7)
Eosinophils Relative: 1 % (ref 0–5)
HEMATOCRIT: 37 % (ref 36.0–46.0)
Hemoglobin: 12.3 g/dL (ref 12.0–15.0)
LYMPHS PCT: 41 % (ref 12–46)
Lymphs Abs: 2.5 10*3/uL (ref 0.7–4.0)
MCH: 30.1 pg (ref 26.0–34.0)
MCHC: 33.2 g/dL (ref 30.0–36.0)
MCV: 90.5 fL (ref 78.0–100.0)
MPV: 9.6 fL (ref 8.6–12.4)
Monocytes Absolute: 0.4 10*3/uL (ref 0.1–1.0)
Monocytes Relative: 7 % (ref 3–12)
Neutro Abs: 3.2 10*3/uL (ref 1.7–7.7)
Neutrophils Relative %: 51 % (ref 43–77)
Platelets: 160 10*3/uL (ref 150–400)
RBC: 4.09 MIL/uL (ref 3.87–5.11)
RDW: 13.9 % (ref 11.5–15.5)
WBC: 6.2 10*3/uL (ref 4.0–10.5)

## 2014-06-07 MED ORDER — PREDNISONE 20 MG PO TABS: ORAL_TABLET | ORAL | Status: DC

## 2014-06-07 MED ORDER — GABAPENTIN 100 MG PO CAPS: ORAL_CAPSULE | ORAL | Status: DC

## 2014-06-07 NOTE — Patient Instructions (Signed)
Recommend Adult Low dose Aspirin or baby Aspirin 81 mg daily   To reduce risk of Colon Cancer 20 %,   Skin Cancer 26 % ,   Melanoma 46%   and   Pancreatic cancer 60%  ++++++++++++++++++  Vitamin D goal is between 70-100.   Please make sure that you are taking your Vitamin D as directed.   It is very important as a natural anti-inflammatory   helping hair, skin, and nails, as well as reducing stroke and heart attack risk.   It helps your bones and helps with mood.  It also decreases numerous cancer risks so please take it as directed.   Low Vit D is associated with a 200-300% higher risk for CANCER   and 200-300% higher risk for HEART   ATTACK  &  STROKE.    ......................................  It is also associated with higher death rate at younger ages,   autoimmune diseases like Rheumatoid arthritis, Lupus, Multiple Sclerosis.     Also many other serious conditions, like depression, Alzheimer's  Dementia, infertility, muscle aches, fatigue, fibromyalgia - just to name a few.  +++++++++++++++++++    Recommend the book "The END of DIETING" by Dr Joel Fuhrman   & the book "The END of DIABETES " by Dr Joel Fuhrman  At [employer] - get book & Audio CD's     Being diabetic has a  300% increased risk for heart attack, stroke, cancer, and alzheimer- type vascular dementia. It is very important that you work harder with diet by avoiding all foods that are white. Avoid white rice (brown & wild rice is OK), white potatoes (sweetpotatoes in moderation is OK), White bread or wheat bread or anything made out of white flour like bagels, donuts, rolls, buns, biscuits, cakes, pastries, cookies, pizza crust, and pasta (made from white flour & egg whites) - vegetarian pasta or spinach or wheat pasta is OK. Multigrain breads like Arnold's or [employer], or multigrain sandwich thins or flatbreads.  Diet, exercise and weight loss can reverse and cure diabetes in the early  stages.  Diet, exercise and weight loss is very important in the control and prevention of complications of diabetes which affects every system in your body, ie. Brain - dementia/stroke, eyes - glaucoma/blindness, heart - heart attack/heart failure, kidneys - dialysis, stomach - gastric paralysis, intestines - malabsorption, nerves - severe painful neuritis, circulation - gangrene & loss of a leg(s), and finally cancer and Alzheimers.    I recommend avoid fried & greasy foods,  sweets/candy, white rice (brown or wild rice or Quinoa is OK), white potatoes (sweet potatoes are OK) - anything made from white flour - bagels, doughnuts, rolls, buns, biscuits,white and wheat breads, pizza crust and traditional pasta made of white flour & egg white(vegetarian pasta or spinach or wheat pasta is OK).  Multi-grain bread is OK - like multi-grain flat bread or sandwich thins. Avoid alcohol in excess. Exercise is also important.    Eat all the vegetables you want - avoid meat, especially red meat and dairy - especially cheese.  Cheese is the most concentrated form of trans-fats which is the worst thing to clog up our arteries. Veggie cheese is OK which can be found in the fresh produce section at [employer] or [employer] or [employer]  ++++++++++++++++++++++++++  Preventive Care for Adults  A healthy lifestyle and preventive care can promote health and wellness. Preventive health guidelines for women include the following key practices.  A routine yearly physical is   a good way to check with your health care provider about your health and preventive screening. It is a chance to share any concerns and updates on your health and to receive a thorough exam.  Visit your dentist for a routine exam and preventive care every 6 months. Brush your teeth twice a day and floss once a day. Good oral hygiene prevents tooth decay and gum disease.  The frequency of eye exams is based on your age, health, family medical  history, use of contact lenses, and other factors. Follow your health care provider's recommendations for frequency of eye exams.  Eat a healthy diet. Foods like vegetables, fruits, whole grains, low-fat dairy products, and lean protein foods contain the nutrients you need without too many calories. Decrease your intake of foods high in solid fats, added sugars, and salt. Eat the right amount of calories for you.Get information about a proper diet from your health care provider, if necessary.  Regular physical exercise is one of the most important things you can do for your health. Most adults should get at least 150 minutes of moderate-intensity exercise (any activity that increases your heart rate and causes you to sweat) each week. In addition, most adults need muscle-strengthening exercises on 2 or more days a week.  Maintain a healthy weight. The body mass index (BMI) is a screening tool to identify possible weight problems. It provides an estimate of body fat based on height and weight. Your health care provider can find your BMI and can help you achieve or maintain a healthy weight.For adults 20 years and older:  A BMI below 18.5 is considered underweight.  A BMI of 18.5 to 24.9 is normal.  A BMI of 25 to 29.9 is considered overweight.  A BMI of 30 and above is considered obese.  Maintain normal blood lipids and cholesterol levels by exercising and minimizing your intake of saturated fat. Eat a balanced diet with plenty of fruit and vegetables. If your lipid or cholesterol levels are high, you are over 50, or you are at high risk for heart disease, you may need your cholesterol levels checked more frequently.Ongoing high lipid and cholesterol levels should be treated with medicines if diet and exercise are not working.  If you smoke, find out from your health care provider how to quit. If you do not use tobacco, do not start.  Lung cancer screening is recommended for adults aged 55-80  years who are at high risk for developing lung cancer because of a history of smoking. A yearly low-dose CT scan of the lungs is recommended for people who have at least a 30-pack-year history of smoking and are a current smoker or have quit within the past 15 years. A pack year of smoking is smoking an average of 1 pack of cigarettes a day for 1 year (for example: 1 pack a day for 30 years or 2 packs a day for 15 years). Yearly screening should continue until the smoker has stopped smoking for at least 15 years. Yearly screening should be stopped for people who develop a health problem that would prevent them from having lung cancer treatment.  Avoid use of street drugs. Do not share needles with anyone. Ask for help if you need support or instructions about stopping the use of drugs.  High blood pressure causes heart disease and increases the risk of stroke.  Ongoing high blood pressure should be treated with medicines if weight loss and exercise do not work.  If   you are 55-79 years old, ask your health care provider if you should take aspirin to prevent strokes.  Diabetes screening involves taking a blood sample to check your fasting blood sugar level. This should be done once every 3 years, after age 45, if you are within normal weight and without risk factors for diabetes. Testing should be considered at a younger age or be carried out more frequently if you are overweight and have at least 1 risk factor for diabetes.  Breast cancer screening is essential preventive care for women. You should practice "breast self-awareness." This means understanding the normal appearance and feel of your breasts and may include breast self-examination. Any changes detected, no matter how small, should be reported to a health care provider. Women in their 20s and 30s should have a clinical breast exam (CBE) by a health care provider as part of a regular health exam every 1 to 3 years. After age 40, women should have a  CBE every year. Starting at age 40, women should consider having a mammogram (breast X-ray test) every year. Women who have a family history of breast cancer should talk to their health care provider about genetic screening. Women at a high risk of breast cancer should talk to their health care providers about having an MRI and a mammogram every year.  Breast cancer gene (BRCA)-related cancer risk assessment is recommended for women who have family members with BRCA-related cancers. BRCA-related cancers include breast, ovarian, tubal, and peritoneal cancers. Having family members with these cancers may be associated with an increased risk for harmful changes (mutations) in the breast cancer genes BRCA1 and BRCA2. Results of the assessment will determine the need for genetic counseling and BRCA1 and BRCA2 testing.  Routine pelvic exams to screen for cancer are no longer recommended for nonpregnant women who are considered low risk for cancer of the pelvic organs (ovaries, uterus, and vagina) and who do not have symptoms. Ask your health care provider if a screening pelvic exam is right for you.  If you have had past treatment for cervical cancer or a condition that could lead to cancer, you need Pap tests and screening for cancer for at least 20 years after your treatment. If Pap tests have been discontinued, your risk factors (such as having a new sexual partner) need to be reassessed to determine if screening should be resumed. Some women have medical problems that increase the chance of getting cervical cancer. In these cases, your health care provider may recommend more frequent screening and Pap tests.    Colorectal cancer can be detected and often prevented. Most routine colorectal cancer screening begins at the age of 50 years and continues through age 75 years. However, your health care provider may recommend screening at an earlier age if you have risk factors for colon cancer. On a yearly basis,  your health care provider may provide home test kits to check for hidden blood in the stool. Use of a small camera at the end of a tube, to directly examine the colon (sigmoidoscopy or colonoscopy), can detect the earliest forms of colorectal cancer. Talk to your health care provider about this at age 50, when routine screening begins. Direct exam of the colon should be repeated every 5-10 years through age 75 years, unless early forms of pre-cancerous polyps or small growths are found.  Osteoporosis is a disease in which the bones lose minerals and strength with aging. This can result in serious bone fractures or breaks. The   risk of osteoporosis can be identified using a bone density scan. Women ages 65 years and over and women at risk for fractures or osteoporosis should discuss screening with their health care providers. Ask your health care provider whether you should take a calcium supplement or vitamin D to reduce the rate of osteoporosis.  Menopause can be associated with physical symptoms and risks. Hormone replacement therapy is available to decrease symptoms and risks. You should talk to your health care provider about whether hormone replacement therapy is right for you.  Use sunscreen. Apply sunscreen liberally and repeatedly throughout the day. You should seek shade when your shadow is shorter than you. Protect yourself by wearing long sleeves, pants, a wide-brimmed hat, and sunglasses year round, whenever you are outdoors.  Once a month, do a whole body skin exam, using a mirror to look at the skin on your back. Tell your health care provider of new moles, moles that have irregular borders, moles that are larger than a pencil eraser, or moles that have changed in shape or color.  Stay current with required vaccines (immunizations).  Influenza vaccine. All adults should be immunized every year.  Tetanus, diphtheria, and acellular pertussis (Td, Tdap) vaccine. Pregnant women should receive  1 dose of Tdap vaccine during each pregnancy. The dose should be obtained regardless of the length of time since the last dose. Immunization is preferred during the 27th-36th week of gestation. An adult who has not previously received Tdap or who does not know her vaccine status should receive 1 dose of Tdap. This initial dose should be followed by tetanus and diphtheria toxoids (Td) booster doses every 10 years. Adults with an unknown or incomplete history of completing a 3-dose immunization series with Td-containing vaccines should begin or complete a primary immunization series including a Tdap dose. Adults should receive a Td booster every 10 years.    Zoster vaccine. One dose is recommended for adults aged 60 years or older unless certain conditions are present.    Pneumococcal 13-valent conjugate (PCV13) vaccine. When indicated, a person who is uncertain of her immunization history and has no record of immunization should receive the PCV13 vaccine. An adult aged 19 years or older who has certain medical conditions and has not been previously immunized should receive 1 dose of PCV13 vaccine. This PCV13 should be followed with a dose of pneumococcal polysaccharide (PPSV23) vaccine. The PPSV23 vaccine dose should be obtained at least 8 weeks after the dose of PCV13 vaccine. An adult aged 19 years or older who has certain medical conditions and previously received 1 or more doses of PPSV23 vaccine should receive 1 dose of PCV13. The PCV13 vaccine dose should be obtained 1 or more years after the last PPSV23 vaccine dose.    Pneumococcal polysaccharide (PPSV23) vaccine. When PCV13 is also indicated, PCV13 should be obtained first. All adults aged 65 years and older should be immunized. An adult younger than age 65 years who has certain medical conditions should be immunized. Any person who resides in a nursing home or long-term care facility should be immunized. An adult smoker should be immunized.  People with an immunocompromised condition and certain other conditions should receive both PCV13 and PPSV23 vaccines. People with human immunodeficiency virus (HIV) infection should be immunized as soon as possible after diagnosis. Immunization during chemotherapy or radiation therapy should be avoided. Routine use of PPSV23 vaccine is not recommended for American Indians, [place] Natives, or people younger than 65 years unless there are medical   conditions that require PPSV23 vaccine. When indicated, people who have unknown immunization and have no record of immunization should receive PPSV23 vaccine. One-time revaccination 5 years after the first dose of PPSV23 is recommended for people aged 19-64 years who have chronic kidney failure, nephrotic syndrome, asplenia, or immunocompromised conditions. People who received 1-2 doses of PPSV23 before age 9 years should receive another dose of PPSV23 vaccine at age 62 years or later if at least 5 years have passed since the previous dose. Doses of PPSV23 are not needed for people immunized with PPSV23 at or after age 53 years.   Preventive Services / Frequency  Ages 32 years and over  Blood pressure check.  Lipid and cholesterol check.  Lung cancer screening. / Every year if you are aged 71-80 years and have a 30-pack-year history of smoking and currently smoke or have quit within the past 15 years. Yearly screening is stopped once you have quit smoking for at least 15 years or develop a health problem that would prevent you from having lung cancer treatment.  Clinical breast exam.** / Every year after age 65 years.  BRCA-related cancer risk assessment.** / For women who have family members with a BRCA-related cancer (breast, ovarian, tubal, or peritoneal cancers).  Mammogram.** / Every year beginning at age 46 years and continuing for as long as you are in good health. Consult with your health care provider.  Pap test.** / Every 3 years starting at  age 71 years through age 47 or 50 years with 3 consecutive normal Pap tests. Testing can be stopped between 65 and 70 years with 3 consecutive normal Pap tests and no abnormal Pap or HPV tests in the past 10 years.  Fecal occult blood test (FOBT) of stool. / Every year beginning at age 44 years and continuing until age 73 years. You may not need to do this test if you get a colonoscopy every 10 years.  Flexible sigmoidoscopy or colonoscopy.** / Every 5 years for a flexible sigmoidoscopy or every 10 years for a colonoscopy beginning at age 37 years and continuing until age 2 years.  Hepatitis C blood test.** / For all people born from [birth year] through 1967 and any individual with known risks for hepatitis C.  Osteoporosis screening.** / A one-time screening for women ages 9 years and over and women at risk for fractures or osteoporosis.  Skin self-exam. / Monthly.  Influenza vaccine. / Every year.  Tetanus, diphtheria, and acellular pertussis (Tdap/Td) vaccine.** / 1 dose of Td every 10 years.  Zoster vaccine.** / 1 dose for adults aged 31 years or older.  Pneumococcal 13-valent conjugate (PCV13) vaccine.** / Consult your health care provider.  Pneumococcal polysaccharide (PPSV23) vaccine.** / 1 dose for all adults aged 32 years and older. Screening for abdominal aortic aneurysm (AAA)  by ultrasound is recommended for people who have history of high blood pressure or who are current or former smokers.

## 2014-06-07 NOTE — Progress Notes (Signed)
Patient ID: Stacy Wallace, female   DOB: 1945-04-13, 69 y.o.   MRN: [id number]  Annual Comprehensive Examination  This very nice 69 y.o. MWF presents for complete physical.  Patient has been followed for HTN, Prediabetes, Hyperlipidemia, Hypothyroidism and Vitamin D Deficiency.  Patient has DDD and has had EDSI x 3  by Dr Mina Marble w/o benefit. She continues to c/o pain about the left hip & ischial area. Mood sees depressed today due to her continued pain.     HTN predates since 1999. Patient's BP has been controlled at home and patient denies any cardiac symptoms as chest pain, palpitations, shortness of breath, dizziness or ankle swelling. Today's BP: 114/80 mmHg    Patient's hyperlipidemia is controlled with diet and medications. Patient denies myalgias or other medication SE's. Last lipids were at goal -  Total  Chol 158; HDL 61; LDL  69; Triglycerides 138 on 02/08/2014.   Patient has prediabetes predating since June 2011 with A1c 5.8% and patient denies reactive hypoglycemic symptoms, visual blurring, diabetic polys, or paresthesias. Last A1c was 5.9% 02/08/2014.      Patienthas been on thyroid replacement since 2014 with TSH's normalized and w/o sn's/sx's of excess or deficiency. Finally, patient has history of Vitamin D Deficiency and last Vitamin D was  85 on 02/08/2014.      Medication Sig  . ALPRAZolam  1 MG Take 1 tablet (1 mg total) by mouth at bedtime.  Marland Kitchen aspirin 81 MG Take 81 mg by mouth daily.  Marland Kitchen atenolol 100 MG  Take 1 tablet (100 mg total) by mouth daily.  . benazepril  20 MG  Take 1 tablet (20 mg total) by mouth daily.  . Vit D 5000 Take 5,000 Units by mouth daily.  . citalopram 20 MG  Take 1 tablet (20 mg total) by mouth daily.  Marland Kitchen estradiol 1 MG  Take 1 tablet (1 mg total) by mouth daily.  . furosemide  80 MG  Take 1 tablet (80 mg total) by mouth daily.  Marland Kitchen levothyroxine  50 MCG  TAKE 1 TABLET EVERY DAY BEFORE BREAKFAST  . Magnesium 500 MG  Take 500 mg by mouth daily.  . meloxicam   15 MG  1 po QD/ PRN pain  . omeprazole  20 MG capsule TAKE 1 CAPSULE EVERY DAY  . rosuvastatin  40 MG tablet TAKE 1/2 TO 1 TABLET DAILY FOR CHOLESTEROL AS DIRECTED   Allergies  Allergen Reactions  . Latex   . Lopid [Gemfibrozil]     headache  . Morphine And Related   . Nickel    Past Medical History  Diagnosis Date  . Hyperlipidemia   . Hypertension   . Unspecified hypothyroidism   . Other abnormal glucose   . Unspecified vitamin D deficiency   . GERD (gastroesophageal reflux disease)    Health Maintenance  Topic Date Due  . ZOSTAVAX  03/04/2003  . PNA vac Low Risk Adult (2 of 2 - PCV13) 03/18/2011  . MAMMOGRAM  06/18/2014  . INFLUENZA VACCINE  08/06/2014  . COLONOSCOPY  06/23/2016  . TETANUS/TDAP  11/08/2016  . DEXA SCAN  Completed   Immunization History  Administered Date(s) Administered  . Influenza, High Dose Seasonal PF 09/19/2013  . Influenza-Unspecified 09/15/2012  . Pneumococcal-Unspecified 03/18/2010  . Td 11/09/2006   Past Surgical History  Procedure Laterality Date  . Tubal ligation    . Eye surgery    . Abdominal hysterectomy    . Carpal tunnel release Bilateral 2001  .  Nasal septum surgery     Family History  Problem Relation Age of Onset  . Hypertension Mother   . Hyperlipidemia Mother   . Heart disease Father   . Alcohol abuse Father   . Hyperlipidemia Father   . Hypertension Father   . Heart disease Sister   . Stroke Sister   . Depression Sister   . Heart disease Brother    History  Substance Use Topics  . Smoking status: Former Smoker    Quit date: 03/20/1988  . Smokeless tobacco: Not on file  . Alcohol Use: Yes     Comment: rare glass of wine    ROS Constitutional: Denies fever, chills, weight loss/gain, headaches, insomnia,  night sweats, and change in appetite. Does c/o fatigue. Eyes: Denies redness, blurred vision, diplopia, discharge, itchy, watery eyes.  ENT: Denies discharge, congestion, post nasal drip, epistaxis, sore  throat, earache, hearing loss, dental pain, Tinnitus, Vertigo, Sinus pain, snoring.  Cardio: Denies chest pain, palpitations, irregular heartbeat, syncope, dyspnea, diaphoresis, orthopnea, PND, claudication, edema Respiratory: denies cough, dyspnea, DOE, pleurisy, hoarseness, laryngitis, wheezing.  Gastrointestinal: Denies dysphagia, heartburn, reflux, water brash, pain, cramps, nausea, vomiting, bloating, diarrhea, constipation, hematemesis, melena, hematochezia, jaundice, hemorrhoids Genitourinary: Denies dysuria, frequency, urgency, nocturia, hesitancy, discharge, hematuria, flank pain Breast: Breast lumps, nipple discharge, bleeding.  Musculoskeletal: Denies arthralgia, myalgia, stiffness, Jt. Swelling, pain, limp, and strain/sprain. Denies falls. Skin: Denies puritis, rash, hives, warts, acne, eczema, changing in skin lesion Neuro: No weakness, tremor, incoordination, spasms, paresthesia, pain Psychiatric: Denies confusion, memory loss, sensory loss. Denies Depression. Endocrine: Denies change in weight, skin, hair change, nocturia, and paresthesia, diabetic polys, visual blurring, hyper / hypo glycemic episodes.  Heme/Lymph: No excessive bleeding, bruising, enlarged lymph nodes.  Physical Exam  BP 114/80   Pulse 64  Temp 97.2 F Resp 16  Ht 5' 0.25"   Wt 113 lb 12.8 oz     BMI 22.05   General Appearance: Well nourished and in no apparent distress. Eyes: PERRLA, EOMs, conjunctiva no swelling or erythema, normal fundi and vessels. Sinuses: No frontal/maxillary tenderness ENT/Mouth: EACs patent / TMs  nl. Nares clear without erythema, swelling, mucoid exudates. Oral hygiene is good. No erythema, swelling, or exudate. Tongue normal, non-obstructing. Tonsils not swollen or erythematous. Hearing normal.  Neck: Supple, thyroid normal. No bruits, nodes or JVD. Respiratory: Respiratory effort normal.  BS equal and clear bilateral without rales, rhonci, wheezing or stridor. Cardio: Heart  sounds are normal with regular rate and rhythm and no murmurs, rubs or gallops. Peripheral pulses are normal and equal bilaterally without edema. No aortic or femoral bruits. Chest: symmetric with normal excursions and percussion. Breasts: Symmetric, without lumps, nipple discharge, retractions, or fibrocystic changes.  Abdomen: Flat, soft, with bowl sounds. Nontender, no guarding, rebound, hernias, masses, or organomegaly.  Lymphatics: Non tender without lymphadenopathy.  Genitourinary:  Musculoskeletal: Full ROM all peripheral extremities, joint stability, 5/5 strength, and normal gait.Tender Lt>Rt SI jt areas and Lt Ischial tuberosity area. Skin: Warm and dry without rashes, lesions, cyanosis, clubbing or  ecchymosis.  Neuro: Cranial nerves intact, reflexes equal bilaterally. Normal muscle tone, no cerebellar symptoms. Sensation intact.  Pysch: Awake and oriented X 3, normal affect, Insight and Judgment appropriate.   Assessment and Plan  1. Essential hypertension  - Microalbumin / creatinine urine ratio - EKG 12-Lead - [place], RETROPERITNL ABD,  LTD  2. Hyperlipidemia  - Lipid panel  3. Prediabetes  - Hemoglobin A1c - Insulin, random  4. Vitamin D deficiency  - Vit  D  25 hydroxy   5. Hypothyroidism  - TSH  6. Gastroesophageal reflux disease   7. Screening for rectal cancer  - POC Hemoccult Bld/Stl   8. Depression screen  - Screen Negative  9. At low risk for fall   10. Medication management  - Urine Microscopic - CBC with Differential/Platelet - BASIC METABOLIC PANEL WITH GFR - Hepatic function panel - Magnesium  11. DDD (degenerative disc disease), lumbar   12. Chronic pain syndrome  - Rx Gabapentin 100 mg #90 x 5 rf  Begin 1 tab qhs and increase as tolerated to tid.   13. Ischial bursitis of left side  - Rx Prednisone 20 mg #20  - pulse/taper   Continue prudent diet as discussed, weight control, BP monitoring, regular exercise, and medications.  Discussed med's effects and SE's. Screening labs and tests as requested with regular follow-up as recommended.  Over 40 minutes of exam, counseling, chart review was performed.

## 2014-06-08 LAB — URINALYSIS, MICROSCOPIC ONLY
Casts: NONE SEEN
Crystals: NONE SEEN

## 2014-06-08 LAB — VITAMIN D 25 HYDROXY (VIT D DEFICIENCY, FRACTURES): VIT D 25 HYDROXY: 89 ng/mL (ref 30–100)

## 2014-06-08 LAB — LIPID PANEL
CHOL/HDL RATIO: 2.4 ratio
CHOLESTEROL: 159 mg/dL (ref 0–200)
HDL: 67 mg/dL (ref >=46)
LDL Cholesterol: 70 mg/dL (ref 0–99)
TRIGLYCERIDES: 110 mg/dL (ref <150)
VLDL: 22 mg/dL (ref 0–40)

## 2014-06-08 LAB — BASIC METABOLIC PANEL WITH GFR
BUN: 21 mg/dL (ref 6–23)
CO2: 30 mEq/L (ref 19–32)
Calcium: 9.2 mg/dL (ref 8.4–10.5)
Chloride: 102 mEq/L (ref 96–112)
Creat: 1.27 mg/dL — ABNORMAL HIGH (ref 0.50–1.10)
GFR, Est African American: 49 mL/min — ABNORMAL LOW
GFR, Est Non African American: 43 mL/min — ABNORMAL LOW
Glucose, Bld: 83 mg/dL (ref 70–99)
Potassium: 4 mEq/L (ref 3.5–5.3)
SODIUM: 137 meq/L (ref 135–145)

## 2014-06-08 LAB — MAGNESIUM: MAGNESIUM: 1.9 mg/dL (ref 1.5–2.5)

## 2014-06-08 LAB — HEPATIC FUNCTION PANEL
ALBUMIN: 4 g/dL (ref 3.5–5.2)
ALK PHOS: 39 U/L (ref 39–117)
ALT: 11 U/L (ref 0–35)
AST: 17 U/L (ref 0–37)
BILIRUBIN INDIRECT: 0.5 mg/dL (ref 0.2–1.2)
BILIRUBIN TOTAL: 0.6 mg/dL (ref 0.2–1.2)
Bilirubin, Direct: 0.1 mg/dL (ref 0.0–0.3)
TOTAL PROTEIN: 6 g/dL (ref 6.0–8.3)

## 2014-06-08 LAB — INSULIN, RANDOM: Insulin: 5.6 u[IU]/mL (ref 2.0–19.6)

## 2014-06-08 LAB — MICROALBUMIN / CREATININE URINE RATIO
Creatinine, Urine: 82.6 mg/dL
Microalb Creat Ratio: 6.1 mg/g (ref 0.0–30.0)
Microalb, Ur: 0.5 mg/dL (ref <2.0)

## 2014-06-08 LAB — TSH: TSH: 1.153 u[IU]/mL (ref 0.350–4.500)

## 2014-06-08 LAB — HEMOGLOBIN A1C
Hgb A1c MFr Bld: 5.9 % — ABNORMAL HIGH (ref <5.7)
MEAN PLASMA GLUCOSE: 123 mg/dL — AB (ref <117)

## 2014-06-21 ENCOUNTER — Other Ambulatory Visit: Payer: Self-pay | Admitting: Internal Medicine

## 2014-07-02 ENCOUNTER — Other Ambulatory Visit: Payer: Self-pay

## 2014-07-03 ENCOUNTER — Other Ambulatory Visit (INDEPENDENT_AMBULATORY_CARE_PROVIDER_SITE_OTHER): Payer: Commercial Managed Care - HMO

## 2014-07-03 DIAGNOSIS — Z1212 Encounter for screening for malignant neoplasm of rectum: Secondary | ICD-10-CM

## 2014-07-03 LAB — POC HEMOCCULT BLD/STL (HOME/3-CARD/SCREEN)
Card #3 Fecal Occult Blood, POC: NEGATIVE
FECAL OCCULT BLD: NEGATIVE
Fecal Occult Blood, POC: NEGATIVE

## 2014-07-10 ENCOUNTER — Other Ambulatory Visit: Payer: Self-pay | Admitting: Emergency Medicine

## 2014-07-10 ENCOUNTER — Other Ambulatory Visit: Payer: Self-pay | Admitting: Internal Medicine

## 2014-07-11 ENCOUNTER — Encounter: Payer: Self-pay | Admitting: Internal Medicine

## 2014-07-11 ENCOUNTER — Ambulatory Visit (INDEPENDENT_AMBULATORY_CARE_PROVIDER_SITE_OTHER): Payer: Commercial Managed Care - HMO | Admitting: Internal Medicine

## 2014-07-11 VITALS — BP 122/78 | HR 60 | Temp 97.0°F | Resp 16 | Ht 60.25 in | Wt 114.0 lb

## 2014-07-11 DIAGNOSIS — M7072 Other bursitis of hip, left hip: Secondary | ICD-10-CM | POA: Diagnosis not present

## 2014-07-11 DIAGNOSIS — G894 Chronic pain syndrome: Secondary | ICD-10-CM

## 2014-07-11 NOTE — Patient Instructions (Signed)
++++++++++++++++++++++++++++++++++++   Suggest take Gabapentin 100 mg   1 tablet at Lunch  &   2 tablets at Supper  ++++++++++++++++++++++++++++++++++++

## 2014-07-13 NOTE — Progress Notes (Signed)
Subjective:    Patient ID: Stacy Wallace, female    DOB: [birth year], 69 y.o.   MRN: [id number]  HPI Patient returns for f/u of LBP - felt multi-factorial w/hx/o DDD and had at last OV tenderness over both SI jt areas and the left Ischial bursae. She was treated with a pulse/taper with prednisone and advised trial to increase her Gabapentin dose. No sciatica pains . Does report prompt significant improvement on the prednisone and in discussion it seems as most of her pain discomfort in the Low Back occurs in the mid afternoon to the evening hours.   Medication Sig  . ALPRAZolam  1 MG tablet TAKE 1 TABLET AT BEDTIME  . aspirin 81 MG tablet Take 81 mg by mouth daily.  Marland Kitchen atenolol  100 MG tablet Take 1 tablet (100 mg total) by mouth daily.  . benazepril  20 MG tablet Take 1 tablet (20 mg total) by mouth daily.  . Cholecalciferol  5000 UNITS capsule Take 5,000 Units by mouth daily.  . citalopram  20 MG tablet Take 1 tablet (20 mg total) by mouth daily.  Marland Kitchen estradiol  1 MG tablet Take 1 tablet (1 mg total) by mouth daily.  . furosemide 80 MG tablet Take 1 tablet (80 mg total) by mouth daily.  Marland Kitchen gabapentin  100 MG capsule Take 1 capsule 3 x day for Pain  . levothyroxine  50 MCG tablet TAKE 1 TABLET EVERY DAY BEFORE BREAKFAST  . Magnesium 500 MG TABS Take 500 mg by mouth daily.  . meloxicam  15 MG tablet 1 po QD/ PRN pain  . omeprazole  20 MG capsule TAKE 1 CAPSULE EVERY DAY  . rosuvastatin  40 MG tablet TAKE 1/2 TO 1 TABLET DAILY FOR CHOLESTEROL AS DIRECTED   Allergies  Allergen Reactions  . Latex   . Lopid [Gemfibrozil]     headache  . Morphine And Related   . Nickel    Past Medical History  Diagnosis Date  . Hyperlipidemia   . Hypertension   . Unspecified hypothyroidism   . Other abnormal glucose   . Unspecified vitamin D deficiency   . GERD (gastroesophageal reflux disease)    Past Surgical History  Procedure Laterality Date  . Tubal ligation    . Eye surgery    . Abdominal  hysterectomy    . Carpal tunnel release Bilateral 2001  . Nasal septum surgery     Review of Systems 10 point systems review negative except as above.    Objective:   Physical Exam BP 122/78 mmHg  Pulse 60  Temp(Src) 97 F (36.1 C)  Resp 16  Ht 5' 0.25" (1.53 m)  Wt 114 lb (51.71 kg)  BMI 22.09 kg/m2  HEENT - Eac's patent. TM's Nl. EOM's full. PERRLA. NasoOroPharynx clear. Neck - supple. Nl Thyroid. Carotids 2+ & No bruits, nodes, JVD Chest - Clear equal BS w/o Rales, rhonchi, wheezes. Cor - Nl HS. RRR w/o sig MGR. PP 1(+). No edema. Abd - No palpable organomegaly, masses or tenderness. BS nl. MS- FROM w/o deformities. Muscle power, tone and bulk Nl. Gait Nl. No Si or ischial tenderness today.  Neuro - No obvious Cr N abnormalities. Sensory, motor and Cerebellar functions appear Nl w/o focal abnormalities. Psyche - Mental status normal & appropriate.  No delusions, ideations or obvious mood abnormalities.    Assessment & Plan:   1. Ischial bursitis of left side - resolved   2. Chronic pain syndrome  - advised  to change the dosing schedule of her Gabapentin 100 mg to take 1 tab at lunch & 2 tabs (200 mg ) at supper .  - discussed meds/SE's.

## 2014-09-04 ENCOUNTER — Other Ambulatory Visit: Payer: Self-pay | Admitting: *Deleted

## 2014-09-04 DIAGNOSIS — Z780 Asymptomatic menopausal state: Secondary | ICD-10-CM

## 2014-09-04 MED ORDER — ESTRADIOL 1 MG PO TABS: 1.0000 mg | ORAL_TABLET | Freq: Every day | ORAL | Status: DC

## 2014-09-05 ENCOUNTER — Other Ambulatory Visit: Payer: Self-pay | Admitting: Internal Medicine

## 2014-09-13 ENCOUNTER — Encounter: Payer: Self-pay | Admitting: Internal Medicine

## 2014-09-13 ENCOUNTER — Ambulatory Visit (INDEPENDENT_AMBULATORY_CARE_PROVIDER_SITE_OTHER): Payer: Commercial Managed Care - HMO | Admitting: Internal Medicine

## 2014-09-13 VITALS — BP 118/76 | HR 62 | Temp 98.4°F | Resp 16 | Ht 60.25 in | Wt 116.0 lb

## 2014-09-13 DIAGNOSIS — I10 Essential (primary) hypertension: Secondary | ICD-10-CM

## 2014-09-13 DIAGNOSIS — R7309 Other abnormal glucose: Secondary | ICD-10-CM | POA: Diagnosis not present

## 2014-09-13 DIAGNOSIS — E559 Vitamin D deficiency, unspecified: Secondary | ICD-10-CM | POA: Diagnosis not present

## 2014-09-13 DIAGNOSIS — E785 Hyperlipidemia, unspecified: Secondary | ICD-10-CM

## 2014-09-13 DIAGNOSIS — Z79899 Other long term (current) drug therapy: Secondary | ICD-10-CM | POA: Diagnosis not present

## 2014-09-13 DIAGNOSIS — R7303 Prediabetes: Secondary | ICD-10-CM

## 2014-09-13 DIAGNOSIS — E039 Hypothyroidism, unspecified: Secondary | ICD-10-CM

## 2014-09-13 LAB — CBC WITH DIFFERENTIAL/PLATELET
BASOS ABS: 0 10*3/uL (ref 0.0–0.1)
Basophils Relative: 0 % (ref 0–1)
EOS ABS: 0.1 10*3/uL (ref 0.0–0.7)
Eosinophils Relative: 2 % (ref 0–5)
HCT: 38.8 % (ref 36.0–46.0)
Hemoglobin: 12.8 g/dL (ref 12.0–15.0)
LYMPHS ABS: 2.3 10*3/uL (ref 0.7–4.0)
Lymphocytes Relative: 39 % (ref 12–46)
MCH: 30.2 pg (ref 26.0–34.0)
MCHC: 33 g/dL (ref 30.0–36.0)
MCV: 91.5 fL (ref 78.0–100.0)
MPV: 10.1 fL (ref 8.6–12.4)
Monocytes Absolute: 0.5 10*3/uL (ref 0.1–1.0)
Monocytes Relative: 8 % (ref 3–12)
Neutro Abs: 3.1 10*3/uL (ref 1.7–7.7)
Neutrophils Relative %: 51 % (ref 43–77)
PLATELETS: 172 10*3/uL (ref 150–400)
RBC: 4.24 MIL/uL (ref 3.87–5.11)
RDW: 13.2 % (ref 11.5–15.5)
WBC: 6 10*3/uL (ref 4.0–10.5)

## 2014-09-13 LAB — BASIC METABOLIC PANEL WITH GFR
BUN: 21 mg/dL (ref 7–25)
CHLORIDE: 101 mmol/L (ref 98–110)
CO2: 31 mmol/L (ref 20–31)
Calcium: 9.6 mg/dL (ref 8.6–10.4)
Creat: 1.12 mg/dL — ABNORMAL HIGH (ref 0.60–0.93)
GFR, EST AFRICAN AMERICAN: 57 mL/min — AB (ref >=60)
GFR, EST NON AFRICAN AMERICAN: 50 mL/min — AB (ref >=60)
Glucose, Bld: 94 mg/dL (ref 65–99)
POTASSIUM: 4.6 mmol/L (ref 3.5–5.3)
SODIUM: 140 mmol/L (ref 135–146)

## 2014-09-13 LAB — LIPID PANEL
CHOL/HDL RATIO: 2.8 ratio (ref <=5.0)
Cholesterol: 162 mg/dL (ref 125–200)
HDL: 58 mg/dL (ref >=46)
LDL CALC: 79 mg/dL (ref <130)
TRIGLYCERIDES: 125 mg/dL (ref <150)
VLDL: 25 mg/dL (ref <30)

## 2014-09-13 LAB — HEPATIC FUNCTION PANEL
ALK PHOS: 43 U/L (ref 33–130)
ALT: 10 U/L (ref 6–29)
AST: 22 U/L (ref 10–35)
Albumin: 4.3 g/dL (ref 3.6–5.1)
BILIRUBIN DIRECT: 0.1 mg/dL (ref <=0.2)
BILIRUBIN INDIRECT: 0.4 mg/dL (ref 0.2–1.2)
BILIRUBIN TOTAL: 0.5 mg/dL (ref 0.2–1.2)
Total Protein: 6.5 g/dL (ref 6.1–8.1)

## 2014-09-13 LAB — MAGNESIUM: Magnesium: 2.2 mg/dL (ref 1.5–2.5)

## 2014-09-13 LAB — TSH: TSH: 0.674 u[IU]/mL (ref 0.350–4.500)

## 2014-09-13 MED ORDER — DULOXETINE HCL 30 MG PO CPEP: 30.0000 mg | ORAL_CAPSULE | Freq: Every day | ORAL | Status: DC

## 2014-09-13 NOTE — Patient Instructions (Signed)
Duloxetine delayed-release capsules What is this medicine? DULOXETINE (doo LOX e teen) is used to treat depression, anxiety, and different types of chronic pain. This medicine may be used for other purposes; ask your health care provider or pharmacist if you have questions. COMMON BRAND NAME(S): Cymbalta What should I tell my health care provider before I take this medicine? They need to know if you have any of these conditions: -bipolar disorder or a family history of bipolar disorder -glaucoma -kidney disease -liver disease -suicidal thoughts or a previous suicide attempt -taken medicines called MAOIs like Carbex, Eldepryl, Marplan, Nardil, and Parnate within 14 days -an unusual reaction to duloxetine, other medicines, foods, dyes, or preservatives -pregnant or trying to get pregnant -breast-feeding How should I use this medicine? Take this medicine by mouth with a glass of water. Follow the directions on the prescription label. Do not cut, crush or chew this medicine. You can take this medicine with or without food. Take your medicine at regular intervals. Do not take your medicine more often than directed. Do not stop taking this medicine suddenly except upon the advice of your doctor. Stopping this medicine too quickly may cause serious side effects or your condition may worsen. A special MedGuide will be given to you by the pharmacist with each prescription and refill. Be sure to read this information carefully each time. Talk to your pediatrician regarding the use of this medicine in children. While this drug may be prescribed for children as young as 7 years of age for selected conditions, precautions do apply. Overdosage: If you think you have taken too much of this medicine contact a poison control center or emergency room at once. NOTE: This medicine is only for you. Do not share this medicine with others. What if I miss a dose? If you miss a dose, take it as soon as you can. If it  is almost time for your next dose, take only that dose. Do not take double or extra doses. What may interact with this medicine? Do not take this medicine with any of the following medications: -certain diet drugs like dexfenfluramine, fenfluramine -desvenlafaxine -linezolid -MAOIs like Azilect, Carbex, Eldepryl, Marplan, Nardil, and Parnate -methylene blue (intravenous) -milnacipran -thioridazine -venlafaxine This medicine may also interact with the following medications: -alcohol -aspirin and aspirin-like medicines -certain antibiotics like ciprofloxacin and enoxacin -certain medicines for blood pressure, heart disease, irregular heart beat -certain medicines for depression, anxiety, or psychotic disturbances -certain medicines for migraine headache like almotriptan, eletriptan, frovatriptan, naratriptan, rizatriptan, sumatriptan, zolmitriptan -certain medicines that treat or prevent blood clots like warfarin, enoxaparin, and dalteparin -cimetidine -fentanyl -lithium -NSAIDS, medicines for pain and inflammation, like ibuprofen or naproxen -phentermine -procarbazine -sibutramine -St. John's wort -theophylline -tramadol -tryptophan This list may not describe all possible interactions. Give your health care provider a list of all the medicines, herbs, non-prescription drugs, or dietary supplements you use. Also tell them if you smoke, drink alcohol, or use illegal drugs. Some items may interact with your medicine. What should I watch for while using this medicine? Tell your doctor if your symptoms do not get better or if they get worse. Visit your doctor or health care professional for regular checks on your progress. Because it may take several weeks to see the full effects of this medicine, it is important to continue your treatment as prescribed by your doctor. Patients and their families should watch out for new or worsening thoughts of suicide or depression. Also watch out for  sudden changes in   feelings such as feeling anxious, agitated, panicky, irritable, hostile, aggressive, impulsive, severely restless, overly excited and hyperactive, or not being able to sleep. If this happens, especially at the beginning of treatment or after a change in dose, call your health care professional. You may get drowsy or dizzy. Do not drive, use machinery, or do anything that needs mental alertness until you know how this medicine affects you. Do not stand or sit up quickly, especially if you are an older patient. This reduces the risk of dizzy or fainting spells. Alcohol may interfere with the effect of this medicine. Avoid alcoholic drinks. This medicine can cause an increase in blood pressure. This medicine can also cause a sudden drop in your blood pressure, which may make you feel faint and increase the chance of a fall. These effects are most common when you first start the medicine or when the dose is increased, or during use of other medicines that can cause a sudden drop in blood pressure. Check with your doctor for instructions on monitoring your blood pressure while taking this medicine. Your mouth may get dry. Chewing sugarless gum or sucking hard candy, and drinking plenty of water may help. Contact your doctor if the problem does not go away or is severe. What side effects may I notice from receiving this medicine? Side effects that you should report to your doctor or health care professional as soon as possible: -allergic reactions like skin rash, itching or hives, swelling of the face, lips, or tongue -changes in blood pressure -confusion -dark urine -dizziness -fast talking and excited feelings or actions that are out of control -fast, irregular heartbeat -fever -general ill feeling or flu-like symptoms -hallucination, loss of contact with reality -light-colored stools -loss of balance or coordination -redness, blistering, peeling or loosening of the skin, including  inside the mouth -right upper belly pain -seizures -suicidal thoughts or other mood changes -trouble concentrating -trouble passing urine or change in the amount of urine -unusual bleeding or bruising -unusually weak or tired -yellowing of the eyes or skin Side effects that usually do not require medical attention (report to your doctor or health care professional if they continue or are bothersome): -blurred vision -change in appetite -change in sex drive or performance -headache -increased sweating -nausea This list may not describe all possible side effects. Call your doctor for medical advice about side effects. You may report side effects to FDA at 1-800-FDA-1088. Where should I keep my medicine? Keep out of the reach of children. Store at room temperature between 15 and 30 degrees C (59 and 86 degrees F). Throw away any unused medicine after the expiration date. NOTE: This sheet is a summary. It may not cover all possible information. If you have questions about this medicine, talk to your doctor, pharmacist, or health care provider.  2015, [employer]vier/Gold Standard. (2012-12-13 14:20:31)  

## 2014-09-13 NOTE — Progress Notes (Signed)
Patient ID: Stacy Wallace, female   DOB: May 07, 1945, 69 y.o.   MRN: [id number]  Assessment and Plan:  Hypertension:  -Continue medication,  -monitor blood pressure at home.  -Continue DASH diet.   -Reminder to go to the ER if any CP, SOB, nausea, dizziness, severe HA, changes vision/speech, left arm numbness and tingling, and jaw pain.  Cholesterol: -Continue diet and exercise.  -Check cholesterol.   Pre-diabetes: -Continue diet and exercise.  -Check A1C  Vitamin D Def: -check level -continue medications.   Chronic back pain -cymbalta -recommended sending to ortho to see whether they can do nerve root ablation  Continue diet and meds as discussed. Further disposition pending results of labs.  HPI 69 y.o. female  presents for 3 month follow up with hypertension, hyperlipidemia, prediabetes and vitamin D.   Her blood pressure has been controlled at home, today their BP is BP: 118/76 mmHg.   She does workout. She denies chest pain, shortness of breath, dizziness.   She is on cholesterol medication and denies myalgias. Her cholesterol is at goal. The cholesterol last visit was:   Lab Results  Component Value Date   CHOL 159 06/07/2014   HDL 67 06/07/2014   LDLCALC 70 06/07/2014   TRIG 110 06/07/2014   CHOLHDL 2.4 06/07/2014     She has been working on diet and exercise for prediabetes, and denies foot ulcerations, hyperglycemia, hypoglycemia , increased appetite, nausea, paresthesia of the feet, polydipsia, polyuria, visual disturbances, vomiting and weight loss. Last A1C in the office was:  Lab Results  Component Value Date   HGBA1C 5.9* 06/07/2014    Patient is on Vitamin D supplement.  Lab Results  Component Value Date   VD25OH 89 06/07/2014     She has been having some issues with her hip and low back pain.  She has been followed by Dr. Jacelyn Grip and also by Dr. Lynann Bologna and feels that she has hit an impass as far as her back is concerned.  She cannot take the pain  medication.  She has tried gabapentin and felt like that hurt worse.     Current Medications:  Current Outpatient Prescriptions on File Prior to Visit  Medication Sig Dispense Refill  . ALPRAZolam (XANAX) 1 MG tablet TAKE 1 TABLET AT BEDTIME 90 tablet 1  . aspirin 81 MG tablet Take 81 mg by mouth daily.    Marland Kitchen atenolol (TENORMIN) 100 MG tablet Take 1 tablet (100 mg total) by mouth daily. 90 tablet 4  . benazepril (LOTENSIN) 20 MG tablet Take 1 tablet (20 mg total) by mouth daily. 90 tablet 4  . Cholecalciferol (D 5000) 5000 UNITS capsule Take 5,000 Units by mouth daily.    Marland Kitchen estradiol (ESTRACE) 1 MG tablet Take 1 tablet (1 mg total) by mouth daily. 90 tablet 4  . furosemide (LASIX) 80 MG tablet Take 1 tablet (80 mg total) by mouth daily. 90 tablet 4  . levothyroxine (SYNTHROID, LEVOTHROID) 50 MCG tablet TAKE 1 TABLET EVERY DAY BEFORE BREAKFAST 90 tablet 1  . Magnesium 500 MG TABS Take 500 mg by mouth daily.    . meloxicam (MOBIC) 15 MG tablet 1 po QD/ PRN pain 90 tablet 4  . omeprazole (PRILOSEC) 20 MG capsule TAKE 1 CAPSULE EVERY DAY 90 capsule 1  . rosuvastatin (CRESTOR) 40 MG tablet TAKE 1/2 TO 1 TABLET DAILY FOR CHOLESTEROL AS DIRECTED 90 tablet 1   No current facility-administered medications on file prior to visit.    Medical History:  Past Medical History  Diagnosis Date  . Hyperlipidemia   . Hypertension   . Unspecified hypothyroidism   . Other abnormal glucose   . Unspecified vitamin D deficiency   . GERD (gastroesophageal reflux disease)     Allergies:  Allergies  Allergen Reactions  . Latex   . Lopid [Gemfibrozil]     headache  . Morphine And Related   . Nickel      Review of Systems:  Review of Systems  Constitutional: Negative for fever, chills and malaise/fatigue.  HENT: Negative for congestion, ear pain and sore throat.   Musculoskeletal: Positive for back pain.  Skin: Negative.   Neurological: Negative for headaches.    Family history- Review and  unchanged  Social history- Review and unchanged  Physical Exam: BP 118/76 mmHg  Pulse 62  Temp(Src) 98.4 F (36.9 C) (Temporal)  Resp 16  Ht 5' 0.25" (1.53 m)  Wt 116 lb (52.617 kg)  BMI 22.48 kg/m2 Wt Readings from Last 3 Encounters:  09/13/14 116 lb (52.617 kg)  07/11/14 114 lb (51.71 kg)  06/07/14 113 lb 12.8 oz (51.619 kg)    General Appearance: Well nourished well developed, in no apparent distress. Eyes: PERRLA, EOMs, conjunctiva no swelling or erythema ENT/Mouth: Ear canals normal without obstruction, swelling, erythma, discharge.  TMs normal bilaterally.  Oropharynx moist, clear, without exudate, or postoropharyngeal swelling. Neck: Supple, thyroid normal,no cervical adenopathy  Respiratory: Respiratory effort normal, Breath sounds clear A&P without rhonchi, wheeze, or rale.  No retractions, no accessory usage. Cardio: RRR with no MRGs. Brisk peripheral pulses without edema.  Abdomen: Soft, + BS,  Non tender, no guarding, rebound, hernias, masses. Musculoskeletal: Full ROM, 5/5 strength, Normal gait Skin: Warm, dry without rashes, lesions, ecchymosis.  Neuro: Awake and oriented X 3, Cranial nerves intact. Normal muscle tone, no cerebellar symptoms. Psych: Normal affect, Insight and Judgment appropriate.    Starlyn Skeans, PA-C 2:38 PM [facility]

## 2014-09-14 LAB — VITAMIN D 25 HYDROXY (VIT D DEFICIENCY, FRACTURES): Vit D, 25-Hydroxy: 74 ng/mL (ref 30–100)

## 2014-09-14 LAB — INSULIN, RANDOM: Insulin: 6.2 u[IU]/mL (ref 2.0–19.6)

## 2014-09-14 LAB — HEMOGLOBIN A1C
HEMOGLOBIN A1C: 5.8 % — AB (ref <5.7)
Mean Plasma Glucose: 120 mg/dL — ABNORMAL HIGH (ref <117)

## 2014-10-16 ENCOUNTER — Encounter: Payer: Self-pay | Admitting: Internal Medicine

## 2014-10-16 ENCOUNTER — Ambulatory Visit (INDEPENDENT_AMBULATORY_CARE_PROVIDER_SITE_OTHER): Payer: Commercial Managed Care - HMO | Admitting: Internal Medicine

## 2014-10-16 VITALS — BP 136/70 | HR 68 | Temp 98.0°F | Resp 18 | Ht 60.25 in | Wt 114.0 lb

## 2014-10-16 DIAGNOSIS — I10 Essential (primary) hypertension: Secondary | ICD-10-CM | POA: Diagnosis not present

## 2014-10-16 DIAGNOSIS — L659 Nonscarring hair loss, unspecified: Secondary | ICD-10-CM

## 2014-10-16 DIAGNOSIS — M549 Dorsalgia, unspecified: Secondary | ICD-10-CM

## 2014-10-16 DIAGNOSIS — G8929 Other chronic pain: Secondary | ICD-10-CM

## 2014-10-16 DIAGNOSIS — Z23 Encounter for immunization: Secondary | ICD-10-CM

## 2014-10-16 DIAGNOSIS — E538 Deficiency of other specified B group vitamins: Secondary | ICD-10-CM | POA: Diagnosis not present

## 2014-10-16 LAB — IRON AND TIBC
%SAT: 22 % (ref 11–50)
Iron: 70 ug/dL (ref 45–160)
TIBC: 325 ug/dL (ref 250–450)
UIBC: 255 ug/dL (ref 125–400)

## 2014-10-16 LAB — VITAMIN B12: VITAMIN B 12: 439 pg/mL (ref 211–911)

## 2014-10-16 MED ORDER — ATENOLOL 100 MG PO TABS: 100.0000 mg | ORAL_TABLET | Freq: Every day | ORAL | Status: DC

## 2014-10-16 MED ORDER — DULOXETINE HCL 60 MG PO CPEP: 60.0000 mg | ORAL_CAPSULE | Freq: Every day | ORAL | Status: DC

## 2014-10-16 MED ORDER — BENAZEPRIL HCL 20 MG PO TABS: 20.0000 mg | ORAL_TABLET | Freq: Every day | ORAL | Status: DC

## 2014-10-16 MED ORDER — TIZANIDINE HCL 4 MG PO TABS: 4.0000 mg | ORAL_TABLET | Freq: Three times a day (TID) | ORAL | Status: DC | PRN

## 2014-10-16 NOTE — Addendum Note (Signed)
Addended by: Starlyn Skeans A on: 10/16/2014 03:24 PM   Modules accepted: Orders

## 2014-10-16 NOTE — Progress Notes (Signed)
Patient ID: Stacy Wallace, female   DOB: [birth year], 69 y.o.   MRN: [id number]  Assessment and Plan:   1. 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 Chronic back pain -increase to maximal dose -recommended going back to see Dr. Lynann Bologna for nerve root ablation - DULoxetine (CYMBALTA) 60 MG capsule; Take 1 capsule (60 mg total) by mouth daily.  Dispense: 90 capsule; Refill: 1      HPI 69 y.o.female presents for 1 month follow up of starting cymbalta for chronic low back pain and hip pain. Patient reports that she has continued to have pain on her left side in her lower back.  female is taking their medication.  They are no having difficulty with their medications.  They report no adverse reactions. She has not seen or talked to Dr. Lynann Bologna about getting a nerve root ablation.     Patient reports that she feels like her glands are swollen and she also feels like she is losing her hair.  She reports that she has noticed more of the hair loss in the past 3 months.  She reports that she has been on beta blocker for many years.  She reports that her hair has never "fallen out like this".  She reports that there isn't a strong history of hairloss.      Patient reports that she has been losing weight but food doesn't taste right and she just feels like nothing is quite right.     Past Medical History  Diagnosis Date  . Hyperlipidemia   . Hypertension   . Unspecified hypothyroidism   . Other abnormal glucose   . Unspecified vitamin D deficiency   . GERD (gastroesophageal reflux disease)      Allergies  Allergen Reactions  . Latex   . Lopid [Gemfibrozil]     headache  . Morphine And Related   . Nickel       Current Outpatient Prescriptions on File Prior to Visit  Medication Sig Dispense Refill  . ALPRAZolam (XANAX) 1 MG tablet TAKE 1 TABLET AT  BEDTIME 90 tablet 1  . aspirin 81 MG tablet Take 81 mg by mouth daily.    Marland Kitchen atenolol (TENORMIN) 100 MG tablet Take 1 tablet (100 mg total) by mouth daily. 90 tablet 4  . benazepril (LOTENSIN) 20 MG tablet Take 1 tablet (20 mg total) by mouth daily. 90 tablet 4  . Cholecalciferol (D 5000) 5000 UNITS capsule Take 5,000 Units by mouth daily.    . DULoxetine (CYMBALTA) 30 MG capsule Take 1 capsule (30 mg total) by mouth daily. 30 capsule 0  . estradiol (ESTRACE) 1 MG tablet Take 1 tablet (1 mg total) by mouth daily. 90 tablet 4  . furosemide (LASIX) 80 MG tablet Take 1 tablet (80 mg total) by mouth daily. 90 tablet 4  . levothyroxine (SYNTHROID, LEVOTHROID) 50 MCG tablet TAKE 1 TABLET EVERY DAY BEFORE BREAKFAST 90 tablet 1  . Magnesium 500 MG TABS Take 500 mg by mouth daily.    . meloxicam (MOBIC) 15 MG tablet 1 po QD/ PRN pain 90 tablet 4  . omeprazole (PRILOSEC) 20 MG capsule TAKE 1 CAPSULE EVERY DAY 90 capsule 1  . rosuvastatin (CRESTOR) 40 MG tablet TAKE 1/2 TO 1 TABLET DAILY FOR CHOLESTEROL AS DIRECTED 90 tablet 1   No current facility-administered medications on file prior  to visit.    ROS: all negative except above.   Physical Exam: Filed Weights   10/16/14 1435  Weight: 114 lb (51.71 kg)   Wt Readings from Last 3 Encounters:  10/16/14 114 lb (51.71 kg)  09/13/14 116 lb (52.617 kg)  07/11/14 114 lb (51.71 kg)    BP 144/70 mmHg  Temp(Src) 98 F (36.7 C) (Temporal)  Resp 18  Ht 5' 0.25" (1.53 m)  Wt 114 lb (51.71 kg)  BMI 22.09 kg/m2 General Appearance: Well developed well nourished, non-toxic appearing in no apparent distress. Eyes: PERRLA, EOMs, conjunctiva w/ no swelling or erythema or discharge Sinuses: No Frontal/maxillary tenderness ENT/Mouth: Ear canals clear without swelling or erythema.  TM's normal bilaterally with no retractions, bulging, or loss of landmarks. Hair loss located mainly at the crown of the head around the part.   Neck: Supple, thyroid normal, no  notable JVD  Respiratory: Respiratory effort normal, Clear breath sounds anteriorly and posteriorly bilaterally without rales, rhonchi, wheezing or stridor. No retractions or accessory muscle usage. Cardio: RRR with no MRGs.   Abdomen: Soft, + BS.  Non tender, no guarding, rebound, hernias, masses.  Musculoskeletal: Full ROM, 5/5 strength, normal gait. Chronic low back tenderness to palpation Skin: Warm, dry without rashes  Neuro: Awake and oriented X 3, Cranial nerves intact. Normal muscle tone, no cerebellar symptoms. Sensation intact.  Psych: normal affect, Insight and Judgment appropriate.     Starlyn Skeans, PA-C 2:44 PM [facility]

## 2014-10-17 LAB — TESTOSTERONE: TESTOSTERONE: 47 ng/dL (ref 10–70)

## 2014-10-17 LAB — TESTOSTERONE, FREE, TOTAL, SHBG
Sex Hormone Binding: 98 nmol/L — ABNORMAL HIGH (ref 14–73)
TESTOSTERONE FREE: 3.2 pg/mL (ref 0.6–6.8)
TESTOSTERONE-% FREE: 0.8 % (ref 0.4–2.4)
Testosterone: 38 ng/dL (ref 10–70)

## 2014-10-18 ENCOUNTER — Other Ambulatory Visit: Payer: Self-pay | Admitting: Physician Assistant

## 2014-10-18 LAB — ZINC: Zinc: 66 ug/dL (ref 60–130)

## 2014-10-26 ENCOUNTER — Other Ambulatory Visit: Payer: Self-pay | Admitting: *Deleted

## 2014-10-26 MED ORDER — ROSUVASTATIN CALCIUM 40 MG PO TABS: ORAL_TABLET | ORAL | Status: DC

## 2014-10-26 NOTE — Telephone Encounter (Signed)
Patient called office stating she only wants Brand Name Crestor only.  States she can not tolerate the generic and the mail order was charging her $40 for Crestor and $35 for the generic, therefore, she wants brand name Crestor.

## 2014-11-05 ENCOUNTER — Other Ambulatory Visit: Payer: Self-pay | Admitting: Internal Medicine

## 2014-12-03 ENCOUNTER — Other Ambulatory Visit: Payer: Self-pay

## 2014-12-03 MED ORDER — OMEPRAZOLE 20 MG PO CPDR: 20.0000 mg | DELAYED_RELEASE_CAPSULE | Freq: Every day | ORAL | Status: DC

## 2014-12-10 ENCOUNTER — Other Ambulatory Visit: Payer: Self-pay | Admitting: Internal Medicine

## 2014-12-17 ENCOUNTER — Ambulatory Visit (INDEPENDENT_AMBULATORY_CARE_PROVIDER_SITE_OTHER): Payer: Commercial Managed Care - HMO | Admitting: Internal Medicine

## 2014-12-17 ENCOUNTER — Other Ambulatory Visit: Payer: Self-pay | Admitting: *Deleted

## 2014-12-17 ENCOUNTER — Encounter: Payer: Self-pay | Admitting: Internal Medicine

## 2014-12-17 VITALS — BP 142/86 | HR 60 | Temp 97.1°F | Resp 16 | Ht 60.25 in | Wt 114.7 lb

## 2014-12-17 DIAGNOSIS — Z79899 Other long term (current) drug therapy: Secondary | ICD-10-CM

## 2014-12-17 DIAGNOSIS — Z1389 Encounter for screening for other disorder: Secondary | ICD-10-CM

## 2014-12-17 DIAGNOSIS — K219 Gastro-esophageal reflux disease without esophagitis: Secondary | ICD-10-CM

## 2014-12-17 DIAGNOSIS — E785 Hyperlipidemia, unspecified: Secondary | ICD-10-CM | POA: Diagnosis not present

## 2014-12-17 DIAGNOSIS — I10 Essential (primary) hypertension: Secondary | ICD-10-CM | POA: Diagnosis not present

## 2014-12-17 DIAGNOSIS — Z1331 Encounter for screening for depression: Secondary | ICD-10-CM

## 2014-12-17 DIAGNOSIS — R7303 Prediabetes: Secondary | ICD-10-CM | POA: Diagnosis not present

## 2014-12-17 DIAGNOSIS — R7309 Other abnormal glucose: Secondary | ICD-10-CM | POA: Diagnosis not present

## 2014-12-17 DIAGNOSIS — E039 Hypothyroidism, unspecified: Secondary | ICD-10-CM | POA: Diagnosis not present

## 2014-12-17 DIAGNOSIS — E559 Vitamin D deficiency, unspecified: Secondary | ICD-10-CM | POA: Diagnosis not present

## 2014-12-17 LAB — CBC WITH DIFFERENTIAL/PLATELET
BASOS ABS: 0 10*3/uL (ref 0.0–0.1)
BASOS PCT: 0 % (ref 0–1)
EOS ABS: 0.1 10*3/uL (ref 0.0–0.7)
EOS PCT: 1 % (ref 0–5)
HCT: 39.1 % (ref 36.0–46.0)
Hemoglobin: 12.8 g/dL (ref 12.0–15.0)
LYMPHS ABS: 2.7 10*3/uL (ref 0.7–4.0)
Lymphocytes Relative: 42 % (ref 12–46)
MCH: 29.6 pg (ref 26.0–34.0)
MCHC: 32.7 g/dL (ref 30.0–36.0)
MCV: 90.3 fL (ref 78.0–100.0)
MPV: 10 fL (ref 8.6–12.4)
Monocytes Absolute: 0.4 10*3/uL (ref 0.1–1.0)
Monocytes Relative: 6 % (ref 3–12)
NEUTROS PCT: 51 % (ref 43–77)
Neutro Abs: 3.3 10*3/uL (ref 1.7–7.7)
PLATELETS: 168 10*3/uL (ref 150–400)
RBC: 4.33 MIL/uL (ref 3.87–5.11)
RDW: 13.3 % (ref 11.5–15.5)
WBC: 6.4 10*3/uL (ref 4.0–10.5)

## 2014-12-17 LAB — BASIC METABOLIC PANEL WITH GFR
BUN: 21 mg/dL (ref 7–25)
CHLORIDE: 100 mmol/L (ref 98–110)
CO2: 30 mmol/L (ref 20–31)
Calcium: 9.5 mg/dL (ref 8.6–10.4)
Creat: 0.96 mg/dL — ABNORMAL HIGH (ref 0.60–0.93)
GFR, EST AFRICAN AMERICAN: 69 mL/min (ref >=60)
GFR, EST NON AFRICAN AMERICAN: 60 mL/min (ref >=60)
Glucose, Bld: 81 mg/dL (ref 65–99)
POTASSIUM: 4.2 mmol/L (ref 3.5–5.3)
SODIUM: 140 mmol/L (ref 135–146)

## 2014-12-17 LAB — HEPATIC FUNCTION PANEL
ALT: 10 U/L (ref 6–29)
AST: 22 U/L (ref 10–35)
Albumin: 4.1 g/dL (ref 3.6–5.1)
Alkaline Phosphatase: 44 U/L (ref 33–130)
Bilirubin, Direct: 0.2 mg/dL (ref <=0.2)
Indirect Bilirubin: 0.3 mg/dL (ref 0.2–1.2)
Total Bilirubin: 0.5 mg/dL (ref 0.2–1.2)
Total Protein: 6.6 g/dL (ref 6.1–8.1)

## 2014-12-17 LAB — LIPID PANEL
CHOL/HDL RATIO: 2.3 ratio (ref <=5.0)
CHOLESTEROL: 157 mg/dL (ref 125–200)
HDL: 68 mg/dL (ref >=46)
LDL Cholesterol: 72 mg/dL (ref <130)
Triglycerides: 85 mg/dL (ref <150)
VLDL: 17 mg/dL (ref <30)

## 2014-12-17 LAB — TSH: TSH: 1.825 u[IU]/mL (ref 0.350–4.500)

## 2014-12-17 LAB — MAGNESIUM: Magnesium: 1.9 mg/dL (ref 1.5–2.5)

## 2014-12-17 MED ORDER — TIZANIDINE HCL 4 MG PO TABS: ORAL_TABLET | ORAL | Status: DC

## 2014-12-17 NOTE — Patient Instructions (Signed)

## 2014-12-17 NOTE — Progress Notes (Signed)
Patient ID: Stacy Wallace, female   DOB: [birth year], 69 y.o.   MRN: [id number]   This very nice 69 y.o. MWF presents for 6 month follow up with Hypertension, Hyperlipidemia, Pre-Diabetes and Vitamin D Deficiency. Patient also reports ongoing c/o pain in the L buttock radiating down the back of the LLE. She has been seen by Dr's Rhona Raider and Mina Marble and apparently had an EDSI with short term relief. Apparently she did have an MRI showing L5/S1 DDD w/foraminal stenosis. .    Patient is treated for HTN since 1999 & BP has been controlled at home. Today's BP: (!) 142/86 mmHg. Patient has had no complaints of any cardiac type chest pain, palpitations, dyspnea/orthopnea/PND, dizziness, claudication, or dependent edema.   Hyperlipidemia is controlled with diet & meds. Patient denies myalgias or other med SE's. Last Lipids were were at goal with Cholesterol 162; HDL 58; LDL 79; Triglycerides 125 on 09/13/2014.   Also, the patient has history of PreDiabetes since 2011 with A1c 5.8%  and has had no symptoms of reactive hypoglycemia, diabetic polys, paresthesias or visual blurring.  Last A1c was still 5.8% on 09/13/2014.    Further, the patient also has history of Vitamin D Deficiency and supplements vitamin D without any suspected side-effects. Last vitamin D was 74 on 09/13/2014.  Medication Sig  . ALPRAZolam  1 MG tablet TAKE 1 TABLET AT BEDTIME  . aspirin 81 MG tablet Take 81 mg by mouth daily.  Marland Kitchen atenolol 100 MG tablet Take 1 tablet (100 mg total) by mouth daily.  . benazepril  20 MG tablet Take 1 tablet (20 mg total) by mouth daily.  . Vit D 5000 UNITS  Take 5,000 Units by mouth daily.  Marland Kitchen estradiol ( 1 MG tablet Take 1 tablet (1 mg total) by mouth daily.  . Furosemide 80 MG tablet TAKE 1 TABLET EVERY DAY  . levothyroxine  50 MCG tablet TAKE 1 TABLET EVERY DAY BEFORE BREAKFAST  . Magnesium 500 MG TABS Take 500 mg by mouth daily.  . meloxicam  15 MG tablet TAKE 1 TABLET EVERY DAY AS NEEDED FOR PAIN  .  omeprazole  20 MG capsule Take 1 capsule (20 mg total) by mouth daily.  . rosuvastatin  40 MG tablet TAKE 1/2 TO 1 TABLET DAILY FOR CHOLESTEROL AS DIRECTED  . tiZANidine 4 MG tablet TAKE 1 TABLET BY MOUTH EVERY 8 HOURS AS NEEDED FOR MUSCLE SPASMS  . DULoxetine  60 MG capsule Take 1 capsule daily.- Patient uncertain if taking    Allergies  Allergen Reactions  . Latex   . Lopid [Gemfibrozil]     headache  . Morphine And Related   . Nickel    PMHx:   Past Medical History  Diagnosis Date  . Hyperlipidemia   . Hypertension   . Unspecified hypothyroidism   . Other abnormal glucose   . Unspecified vitamin D deficiency   . GERD (gastroesophageal reflux disease)    Immunization History  Administered Date(s) Administered  . Influenza, High Dose Seasonal PF 09/19/2013, 10/16/2014  . Influenza-Unspecified 09/15/2012  . Pneumococcal-Unspecified 03/18/2010  . Td 11/09/2006   Past Surgical History  Procedure Laterality Date  . Tubal ligation    . Eye surgery    . Abdominal hysterectomy    . Carpal tunnel release Bilateral 2001  . Nasal septum surgery     FHx:    Reviewed / unchanged  SHx:    Reviewed / unchanged  Systems Review:  Constitutional: Denies fever, chills,  wt changes, headaches, insomnia, fatigue, night sweats, change in appetite. Eyes: Denies redness, blurred vision, diplopia, discharge, itchy, watery eyes.  ENT: Denies discharge, congestion, post nasal drip, epistaxis, sore throat, earache, hearing loss, dental pain, tinnitus, vertigo, sinus pain, snoring.  CV: Denies chest pain, palpitations, irregular heartbeat, syncope, dyspnea, diaphoresis, orthopnea, PND, claudication or edema. Respiratory: denies cough, dyspnea, DOE, pleurisy, hoarseness, laryngitis, wheezing.  Gastrointestinal: Denies dysphagia, odynophagia, heartburn, reflux, water brash, abdominal pain or cramps, nausea, vomiting, bloating, diarrhea, constipation, hematemesis, melena, hematochezia  or  hemorrhoids. Genitourinary: Denies dysuria, frequency, urgency, nocturia, hesitancy, discharge, hematuria or flank pain. Musculoskeletal: c/o ongoing Lt sciatica. Denies falls/Fractures. Skin: Denies pruritus, rash, hives, warts, acne, eczema or change in skin lesion(s). Neuro: No weakness, tremor, incoordination, spasms, paresthesia or pain. Psychiatric: Denies confusion, memory loss or sensory loss. Denies Depression/Mood changes. Endo: Denies change in weight, skin or hair change.  Heme/Lymph: No excessive bleeding, bruising or enlarged lymph nodes.  Physical Exam  BP 142/86 mmHg  Pulse 60  Temp(Src) 97.1 F (36.2 C)  Resp 16  Ht 5' 0.25" (1.53 m)  Wt 114 lb 11.2 oz (52.028 kg)  BMI 22.23 kg/m2  Appears well nourished and in no distress. Eyes: PERRLA, EOMs, conjunctiva no swelling or erythema. Sinuses: No frontal/maxillary tenderness ENT/Mouth: EAC's clear, TM's nl w/o erythema, bulging. Nares clear w/o erythema, swelling, exudates. Oropharynx clear without erythema or exudates. Oral hygiene is good. Tongue normal, non obstructing. Hearing intact.  Neck: Supple. Thyroid nl. Car 2+/2+ without bruits, nodes or JVD. Chest: Respirations nl with BS clear & equal w/o rales, rhonchi, wheezing or stridor.  Cor: Heart sounds normal w/ regular rate and rhythm without sig. murmurs, gallops, clicks, or rubs. Peripheral pulses normal and equal  without edema.  Abdomen: Soft & bowel sounds normal. Non-tender w/o guarding, rebound, hernias, masses, or organomegaly.  Lymphatics: Unremarkable.  Musculoskeletal: Full ROM all peripheral extremities, joint stability, 5/5 strength, and normal gait.  Skin: Warm, dry without exposed rashes, lesions or ecchymosis apparent.  Neuro: Cranial nerves intact, reflexes equal bilaterally. Sensory-motor testing grossly intact. Tendon reflexes grossly intact.  Pysch: Alert & oriented x 3.  Insight and judgement nl & appropriate. No ideations.  Assessment and  Plan:  1. Essential hypertension  - TSH  2. Hyperlipidemia  - Lipid panel  3. Prediabetes  - Hemoglobin A1c - Insulin, random  4. Vitamin D deficiency  - VITAMIN D 25 Hydroxy   5. Hypothyroidism  - TSH  6. Gastroesophageal reflux disease   7. Medication management  - CBC with Differential/Platelet - BASIC METABOLIC PANEL WITH GFR - Hepatic function panel - Magnesium  8. Depression screen   Recommended regular exercise, BP monitoring, weight control, and discussed med and SE's. Recommended labs to assess and monitor clinical status. Further disposition pending results of labs. Over 30 minutes of exam, counseling, chart review was performed

## 2014-12-18 LAB — HEMOGLOBIN A1C
HEMOGLOBIN A1C: 5.9 % — AB (ref <5.7)
MEAN PLASMA GLUCOSE: 123 mg/dL — AB (ref <117)

## 2014-12-18 LAB — VITAMIN D 25 HYDROXY (VIT D DEFICIENCY, FRACTURES): Vit D, 25-Hydroxy: 126 ng/mL — ABNORMAL HIGH (ref 30–100)

## 2014-12-18 LAB — INSULIN, RANDOM: Insulin: 3.8 u[IU]/mL (ref 2.0–19.6)

## 2015-01-23 ENCOUNTER — Other Ambulatory Visit: Payer: Self-pay | Admitting: Internal Medicine

## 2015-02-13 ENCOUNTER — Ambulatory Visit (INDEPENDENT_AMBULATORY_CARE_PROVIDER_SITE_OTHER): Payer: Commercial Managed Care - HMO | Admitting: Internal Medicine

## 2015-02-13 ENCOUNTER — Encounter: Payer: Self-pay | Admitting: Internal Medicine

## 2015-02-13 VITALS — BP 122/70 | HR 70 | Temp 97.8°F | Resp 16 | Ht 60.25 in | Wt 111.0 lb

## 2015-02-13 DIAGNOSIS — M5432 Sciatica, left side: Secondary | ICD-10-CM

## 2015-02-13 NOTE — Progress Notes (Signed)
   Subjective:    Patient ID: Stacy Wallace, female    DOB: 1945/02/14, 70 y.o.   MRN: [id number]  Leg Pain  Associated symptoms include numbness.  Patient presents to the office for evaluation of increasing hip pain which has been an issue for the past 2-3 years.  She would like to go back to see Dr. Jacelyn Grip who has been doing injections for her hip.  She wants to know if she can have another injection.  She has seen Dr. Lynann Bologna in the past.  She feels like this is the only thing that normally helps her with this pain. She reports that she has shaking because of the pain.  She is currently taking zanaflex, mobic.  She reports that she doesn't think that she is taking the duloxetine.  She thinks that she can get in today if we call over there.  She reports that even the elastic on her panties runs in that spot and she can barely wear them.  She hasn't had any new injury this is the same chronic issue for which she has been seen.     Review of Systems  Constitutional: Negative for fever, chills and fatigue.  Musculoskeletal: Positive for arthralgias and gait problem. Negative for joint swelling.  Skin: Negative for color change.  Neurological: Positive for numbness. Negative for dizziness.       Objective:   Physical Exam  Constitutional: She is oriented to person, place, and time. She appears well-developed and well-nourished. No distress.  HENT:  Head: Normocephalic.  Mouth/Throat: Oropharynx is clear and moist. No oropharyngeal exudate.  Eyes: Conjunctivae are normal. No scleral icterus.  Neck: Normal range of motion. Neck supple. No JVD present. No thyromegaly present.  Cardiovascular: Normal rate, regular rhythm, normal heart sounds and intact distal pulses.  Exam reveals no gallop and no friction rub.   No murmur heard. Pulmonary/Chest: Effort normal and breath sounds normal. No respiratory distress. She has no wheezes. She has no rales. She exhibits no tenderness.  Abdominal: Soft.  Bowel sounds are normal. She exhibits no distension and no mass. There is no tenderness. There is no rebound and no guarding.  Musculoskeletal: Normal range of motion.  Patient rises slowly from sitting to standing.  They walk without an antalgic gait.  There is no evidence of erythema, ecchymosis, or gross deformity.  There is no tenderness to palpation over spine, buttocks, sciatic notch, or lateral hip.  Active ROM is full.  Sensation to light touch is intact over all extremities.  Strength is symmetric and equal in all extremities.  Positive left straight leg raise.     Lymphadenopathy:    She has no cervical adenopathy.  Neurological: She is alert and oriented to person, place, and time.  Skin: Skin is warm and dry. She is not diaphoretic.  Psychiatric: She has a normal mood and affect. Her behavior is normal. Judgment and thought content normal.  Nursing note and vitals reviewed.   Filed Vitals:   02/13/15 1101  BP: 122/70  Pulse: 70  Temp: 97.8 F (36.6 C)  Resp: 16        Assessment & Plan:    1. Sciatica of left side -try lyrica samples 1 tablet daily for 3 days and then increase to 2 tablets daily.   - Ambulatory referral to Orthopedics

## 2015-02-13 NOTE — Patient Instructions (Signed)
Please take 1 lyrica tablet at night time daily.  If you have no issues with take the lyrica at night and you have no side effects like headache, or upset stomach than you can take 1 tablet at night and 1 tablet in the morning.    Please try using biotine mouthwash and suck on hard candies to help increase the moisture in your mouth.    Katrina will call you about the appointment with Dr. Mina Marble.    Pregabalin capsules What is this medicine? PREGABALIN (pre GAB a lin) is used to treat nerve pain from diabetes, shingles, spinal cord injury, and fibromyalgia. It is also used to control seizures in epilepsy. This medicine may be used for other purposes; ask your health care provider or pharmacist if you have questions. What should I tell my health care provider before I take this medicine? They need to know if you have any of these conditions: -bleeding problems -heart disease, including heart failure -history of alcohol or drug abuse -kidney disease -suicidal thoughts, plans, or attempt; a previous suicide attempt by you or a family member -an unusual or allergic reaction to pregabalin, gabapentin, other medicines, foods, dyes, or preservatives -pregnant or trying to get pregnant or trying to conceive with your partner -breast-feeding How should I use this medicine? Take this medicine by mouth with a glass of water. Follow the directions on the prescription label. You can take this medicine with or without food. Take your doses at regular intervals. Do not take your medicine more often than directed. Do not stop taking except on your doctor's advice. A special MedGuide will be given to you by the pharmacist with each prescription and refill. Be sure to read this information carefully each time. Talk to your pediatrician regarding the use of this medicine in children. Special care may be needed. Overdosage: If you think you have taken too much of this medicine contact a poison control center or  emergency room at once. NOTE: This medicine is only for you. Do not share this medicine with others. What if I miss a dose? If you miss a dose, take it as soon as you can. If it is almost time for your next dose, take only that dose. Do not take double or extra doses. What may interact with this medicine? -alcohol -certain medicines for blood pressure like captopril, enalapril, or lisinopril -certain medicines for diabetes, like pioglitazone or rosiglitazone -certain medicines for anxiety or sleep -narcotic medicines for pain This list may not describe all possible interactions. Give your health care provider a list of all the medicines, herbs, non-prescription drugs, or dietary supplements you use. Also tell them if you smoke, drink alcohol, or use illegal drugs. Some items may interact with your medicine. What should I watch for while using this medicine? Tell your doctor or healthcare professional if your symptoms do not start to get better or if they get worse. Visit your doctor or health care professional for regular checks on your progress. Do not stop taking except on your doctor's advice. You may develop a severe reaction. Your doctor will tell you how much medicine to take. Wear a medical identification bracelet or chain if you are taking this medicine for seizures, and carry a card that describes your disease and details of your medicine and dosage times. You may get drowsy or dizzy. Do not drive, use machinery, or do anything that needs mental alertness until you know how this medicine affects you. Do not stand or  sit up quickly, especially if you are an older patient. This reduces the risk of dizzy or fainting spells. Alcohol may interfere with the effect of this medicine. Avoid alcoholic drinks. If you have a heart condition, like congestive heart failure, and notice that you are retaining water and have swelling in your hands or feet, contact your health care provider immediately. The  use of this medicine may increase the chance of suicidal thoughts or actions. Pay special attention to how you are responding while on this medicine. Any worsening of mood, or thoughts of suicide or dying should be reported to your health care professional right away. This medicine has caused reduced sperm counts in some men. This may interfere with the ability to father a child. You should talk to your doctor or health care professional if you are concerned about your fertility. Women who become pregnant while using this medicine for seizures may enroll in the [employer] Pregnancy Registry by calling ([phone]. This registry collects information about the safety of antiepileptic drug use during pregnancy. What side effects may I notice from receiving this medicine? Side effects that you should report to your doctor or health care professional as soon as possible: -allergic reactions like skin rash, itching or hives, swelling of the face, lips, or tongue -breathing problems -changes in vision -chest pain -confusion -jerking or unusual movements of any part of your body -loss of memory -muscle pain, tenderness, or weakness -suicidal thoughts or other mood changes -swelling of the ankles, feet, hands -unusual bruising or bleeding Side effects that usually do not require medical attention (Report these to your doctor or health care professional if they continue or are bothersome.): -dizziness -drowsiness -dry mouth -headache -nausea -tremors -trouble sleeping -weight gain This list may not describe all possible side effects. Call your doctor for medical advice about side effects. You may report side effects to FDA at 1-800-FDA-1088. Where should I keep my medicine? Keep out of the reach of children. This medicine can be abused. Keep your medicine in a safe place to protect it from theft. Do not share this medicine with anyone. Selling or giving away this medicine  is dangerous and against the law. This medicine may cause accidental overdose and death if it taken by other adults, children, or pets. Mix any unused medicine with a substance like cat litter or coffee grounds. Then throw the medicine away in a sealed container like a sealed bag or a coffee can with a lid. Do not use the medicine after the expiration date. Store at room temperature between 15 and 30 degrees C (59 and 86 degrees F). NOTE: This sheet is a summary. It may not cover all possible information. If you have questions about this medicine, talk to your doctor, pharmacist, or health care provider.    2016, [employer]. (2013-02-17 15:38:53)

## 2015-02-18 DIAGNOSIS — M7072 Other bursitis of hip, left hip: Secondary | ICD-10-CM | POA: Diagnosis not present

## 2015-02-25 ENCOUNTER — Other Ambulatory Visit: Payer: Self-pay | Admitting: Internal Medicine

## 2015-03-12 DIAGNOSIS — M7072 Other bursitis of hip, left hip: Secondary | ICD-10-CM | POA: Diagnosis not present

## 2015-03-18 ENCOUNTER — Ambulatory Visit (INDEPENDENT_AMBULATORY_CARE_PROVIDER_SITE_OTHER): Payer: Commercial Managed Care - HMO | Admitting: Internal Medicine

## 2015-03-18 ENCOUNTER — Ambulatory Visit: Payer: Self-pay | Admitting: Physician Assistant

## 2015-03-18 ENCOUNTER — Encounter: Payer: Self-pay | Admitting: Internal Medicine

## 2015-03-18 VITALS — BP 158/80 | HR 60 | Temp 97.8°F | Resp 16 | Ht 60.25 in | Wt 108.0 lb

## 2015-03-18 DIAGNOSIS — E785 Hyperlipidemia, unspecified: Secondary | ICD-10-CM | POA: Diagnosis not present

## 2015-03-18 DIAGNOSIS — I10 Essential (primary) hypertension: Secondary | ICD-10-CM

## 2015-03-18 DIAGNOSIS — E039 Hypothyroidism, unspecified: Secondary | ICD-10-CM

## 2015-03-18 DIAGNOSIS — Z79899 Other long term (current) drug therapy: Secondary | ICD-10-CM | POA: Diagnosis not present

## 2015-03-18 DIAGNOSIS — E559 Vitamin D deficiency, unspecified: Secondary | ICD-10-CM | POA: Diagnosis not present

## 2015-03-18 DIAGNOSIS — R7303 Prediabetes: Secondary | ICD-10-CM

## 2015-03-18 DIAGNOSIS — R7309 Other abnormal glucose: Secondary | ICD-10-CM | POA: Diagnosis not present

## 2015-03-18 LAB — CBC WITH DIFFERENTIAL/PLATELET
Basophils Absolute: 0 10*3/uL (ref 0.0–0.1)
Basophils Relative: 0 % (ref 0–1)
EOS ABS: 0.2 10*3/uL (ref 0.0–0.7)
Eosinophils Relative: 2 % (ref 0–5)
HCT: 40.7 % (ref 36.0–46.0)
Hemoglobin: 13.4 g/dL (ref 12.0–15.0)
LYMPHS ABS: 3.4 10*3/uL (ref 0.7–4.0)
Lymphocytes Relative: 35 % (ref 12–46)
MCH: 29.9 pg (ref 26.0–34.0)
MCHC: 32.9 g/dL (ref 30.0–36.0)
MCV: 90.8 fL (ref 78.0–100.0)
MONOS PCT: 8 % (ref 3–12)
MPV: 10.3 fL (ref 8.6–12.4)
Monocytes Absolute: 0.8 10*3/uL (ref 0.1–1.0)
NEUTROS PCT: 55 % (ref 43–77)
Neutro Abs: 5.3 10*3/uL (ref 1.7–7.7)
PLATELETS: 198 10*3/uL (ref 150–400)
RBC: 4.48 MIL/uL (ref 3.87–5.11)
RDW: 13.5 % (ref 11.5–15.5)
WBC: 9.7 10*3/uL (ref 4.0–10.5)

## 2015-03-18 LAB — LIPID PANEL
CHOL/HDL RATIO: 2.1 ratio (ref <=5.0)
CHOLESTEROL: 157 mg/dL (ref 125–200)
HDL: 76 mg/dL (ref >=46)
LDL Cholesterol: 60 mg/dL (ref <130)
TRIGLYCERIDES: 104 mg/dL (ref <150)
VLDL: 21 mg/dL (ref <30)

## 2015-03-18 LAB — HEPATIC FUNCTION PANEL
ALBUMIN: 4.1 g/dL (ref 3.6–5.1)
ALT: 19 U/L (ref 6–29)
AST: 20 U/L (ref 10–35)
Alkaline Phosphatase: 41 U/L (ref 33–130)
Bilirubin, Direct: 0.1 mg/dL (ref <=0.2)
Indirect Bilirubin: 0.4 mg/dL (ref 0.2–1.2)
TOTAL PROTEIN: 6.3 g/dL (ref 6.1–8.1)
Total Bilirubin: 0.5 mg/dL (ref 0.2–1.2)

## 2015-03-18 LAB — BASIC METABOLIC PANEL WITH GFR
BUN: 27 mg/dL — AB (ref 7–25)
CO2: 34 mmol/L — AB (ref 20–31)
CREATININE: 1.19 mg/dL — AB (ref 0.60–0.93)
Calcium: 10 mg/dL (ref 8.6–10.4)
Chloride: 97 mmol/L — ABNORMAL LOW (ref 98–110)
GFR, Est African American: 53 mL/min — ABNORMAL LOW (ref >=60)
GFR, Est Non African American: 46 mL/min — ABNORMAL LOW (ref >=60)
Glucose, Bld: 88 mg/dL (ref 65–99)
Potassium: 4.3 mmol/L (ref 3.5–5.3)
Sodium: 140 mmol/L (ref 135–146)

## 2015-03-18 NOTE — Patient Instructions (Addendum)
Please use lemon drops and also use biotine mouth wash.

## 2015-03-18 NOTE — Progress Notes (Signed)
Assessment and Plan:  Hypertension:  -recheck normal -monitor at home -Continue medication,  -monitor blood pressure at home.  -Continue DASH diet.   -Reminder to go to the ER if any CP, SOB, nausea, dizziness, severe HA, changes vision/speech, left arm numbness and tingling, and jaw pain.  Cholesterol: -Continue diet and exercise.  -Check cholesterol.   Pre-diabetes: -Continue diet and exercise.  -Check A1C  Vitamin D Def: -check level -continue medications.   Chronic back pain -getting epidural injections with Dr. Mina Marble  Dry Mouth -lemon drops -biotine mouth rinse  Continue diet and meds as discussed. Further disposition pending results of labs.  HPI 70 y.o. female  presents for 3 month follow up with hypertension, hyperlipidemia, prediabetes and vitamin D.   Her blood pressure has been controlled at home, today their BP is BP: (!) 158/80 mmHg.   She does workout. She denies chest pain, shortness of breath, dizziness.  She does a lot of exercising and walks a lot.   She is on cholesterol medication and denies myalgias. Her cholesterol is at goal. The cholesterol last visit was:   Lab Results  Component Value Date   CHOL 157 12/17/2014   HDL 68 12/17/2014   LDLCALC 72 12/17/2014   TRIG 85 12/17/2014   CHOLHDL 2.3 12/17/2014     She has been working on diet and exercise for prediabetes, and denies foot ulcerations, hyperglycemia, hypoglycemia , increased appetite, nausea, paresthesia of the feet, polydipsia, polyuria, visual disturbances, vomiting and weight loss. Last A1C in the office was:  Lab Results  Component Value Date   HGBA1C 5.9* 12/17/2014    Patient is on Vitamin D supplement.  Lab Results  Component Value Date   VD25OH 126* 12/17/2014     She reports that she has had a lot of dry mouth lately.  This has been going on a while.  She uses ACT mouthwash.  She feels like everything is dry.  She take tizanidine.  She takes this seldomly only when she is  hurting badly.  She is taking zyrtec daily.    She reports that she has lost a lot of weight recently.  She reports that she has no appetite.    Current Medications:  Current Outpatient Prescriptions on File Prior to Visit  Medication Sig Dispense Refill  . ALPRAZolam (XANAX) 1 MG tablet TAKE 1 TABLET AT BEDTIME 90 tablet 1  . aspirin 81 MG tablet Take 81 mg by mouth daily.    Marland Kitchen atenolol (TENORMIN) 100 MG tablet Take 1 tablet (100 mg total) by mouth daily. 90 tablet 4  . benazepril (LOTENSIN) 20 MG tablet TAKE 1 TABLET EVERY DAY 90 tablet 1  . Cholecalciferol (D 5000) 5000 UNITS capsule Take 5,000 Units by mouth daily.    Marland Kitchen estradiol (ESTRACE) 1 MG tablet Take 1 tablet (1 mg total) by mouth daily. 90 tablet 4  . furosemide (LASIX) 80 MG tablet TAKE 1 TABLET EVERY DAY 90 tablet 4  . levothyroxine (SYNTHROID, LEVOTHROID) 50 MCG tablet TAKE 1 TABLET EVERY DAY BEFORE BREAKFAST 90 tablet 1  . Magnesium 500 MG TABS Take 500 mg by mouth daily.    . meloxicam (MOBIC) 15 MG tablet TAKE 1 TABLET EVERY DAY AS NEEDED FOR PAIN 90 tablet 4  . omeprazole (PRILOSEC) 20 MG capsule Take 1 capsule (20 mg total) by mouth daily. 90 capsule 2  . rosuvastatin (CRESTOR) 40 MG tablet TAKE 1/2 TO 1 TABLET DAILY FOR CHOLESTEROL AS DIRECTED 90 tablet 1  .  tiZANidine (ZANAFLEX) 4 MG tablet TAKE 1 TABLET BY MOUTH EVERY 8 HOURS AS NEEDED FOR MUSCLE SPASMS 270 tablet 1   No current facility-administered medications on file prior to visit.    Medical History:  Past Medical History  Diagnosis Date  . Hyperlipidemia   . Hypertension   . Unspecified hypothyroidism   . Other abnormal glucose   . Unspecified vitamin D deficiency   . GERD (gastroesophageal reflux disease)     Allergies:  Allergies  Allergen Reactions  . Latex   . Lopid [Gemfibrozil]     headache  . Morphine And Related   . Nickel      Review of Systems:  Review of Systems  Constitutional: Negative for fever, chills and malaise/fatigue.   HENT: Negative for congestion, ear pain and sore throat.   Respiratory: Negative for cough, shortness of breath and wheezing.   Cardiovascular: Negative for chest pain, palpitations and leg swelling.  Gastrointestinal: Positive for diarrhea. Negative for heartburn, nausea, vomiting, constipation, blood in stool and melena.  Genitourinary: Negative.   Skin: Negative.   Neurological: Negative for dizziness, loss of consciousness and headaches.  Psychiatric/Behavioral: Positive for depression. The patient is not nervous/anxious and does not have insomnia.     Family history- Review and unchanged  Social history- Review and unchanged  Physical Exam: BP 158/80 mmHg  Pulse 60  Temp(Src) 97.8 F (36.6 C) (Temporal)  Resp 16  Ht 5' 0.25" (1.53 m)  Wt 108 lb (48.988 kg)  BMI 20.93 kg/m2 Wt Readings from Last 3 Encounters:  03/18/15 108 lb (48.988 kg)  02/13/15 111 lb (50.349 kg)  12/17/14 114 lb 11.2 oz (52.028 kg)    General Appearance: Well nourished well developed, in no apparent distress. Eyes: PERRLA, EOMs, conjunctiva no swelling or erythema ENT/Mouth: Ear canals normal without obstruction, swelling, erythma, discharge.  TMs normal bilaterally.  Oropharynx moist, clear, without exudate, or postoropharyngeal swelling. Neck: Supple, thyroid normal,no cervical adenopathy  Respiratory: Respiratory effort normal, Breath sounds clear A&P without rhonchi, wheeze, or rale.  No retractions, no accessory usage. Cardio: RRR with no MRGs. Brisk peripheral pulses without edema.  Abdomen: Soft, + BS,  Non tender, no guarding, rebound, hernias, masses. Musculoskeletal: Full ROM, 5/5 strength, Normal gait Skin: Warm, dry without rashes, lesions, ecchymosis.  Neuro: Awake and oriented X 3, Cranial nerves intact. Normal muscle tone, no cerebellar symptoms. Psych: Normal affect, Insight and Judgment appropriate.    Starlyn Skeans, PA-C 3:30 PM [facility]

## 2015-03-19 LAB — TSH: TSH: 1.46 m[IU]/L

## 2015-03-19 LAB — HEMOGLOBIN A1C
Hgb A1c MFr Bld: 6.1 % — ABNORMAL HIGH (ref <5.7)
Mean Plasma Glucose: 128 mg/dL — ABNORMAL HIGH (ref <117)

## 2015-03-30 ENCOUNTER — Emergency Department (HOSPITAL_COMMUNITY)
Admission: EM | Admit: 2015-03-30 | Discharge: 2015-03-30 | Disposition: A | Discharge status: Home / Self Care | Payer: Commercial Managed Care - HMO | Attending: Emergency Medicine | Admitting: Emergency Medicine

## 2015-03-30 ENCOUNTER — Encounter (HOSPITAL_COMMUNITY): Payer: Self-pay | Admitting: Emergency Medicine

## 2015-03-30 ENCOUNTER — Emergency Department (HOSPITAL_COMMUNITY): Payer: Commercial Managed Care - HMO

## 2015-03-30 DIAGNOSIS — E559 Vitamin D deficiency, unspecified: Secondary | ICD-10-CM | POA: Insufficient documentation

## 2015-03-30 DIAGNOSIS — M47819 Spondylosis without myelopathy or radiculopathy, site unspecified: Secondary | ICD-10-CM

## 2015-03-30 DIAGNOSIS — M549 Dorsalgia, unspecified: Secondary | ICD-10-CM | POA: Insufficient documentation

## 2015-03-30 DIAGNOSIS — I10 Essential (primary) hypertension: Secondary | ICD-10-CM | POA: Insufficient documentation

## 2015-03-30 DIAGNOSIS — Z87891 Personal history of nicotine dependence: Secondary | ICD-10-CM | POA: Diagnosis not present

## 2015-03-30 DIAGNOSIS — R221 Localized swelling, mass and lump, neck: Secondary | ICD-10-CM | POA: Diagnosis not present

## 2015-03-30 DIAGNOSIS — E785 Hyperlipidemia, unspecified: Secondary | ICD-10-CM | POA: Insufficient documentation

## 2015-03-30 DIAGNOSIS — R1310 Dysphagia, unspecified: Secondary | ICD-10-CM | POA: Insufficient documentation

## 2015-03-30 DIAGNOSIS — Z91040 Latex allergy status: Secondary | ICD-10-CM | POA: Insufficient documentation

## 2015-03-30 DIAGNOSIS — E039 Hypothyroidism, unspecified: Secondary | ICD-10-CM | POA: Diagnosis not present

## 2015-03-30 DIAGNOSIS — K219 Gastro-esophageal reflux disease without esophagitis: Secondary | ICD-10-CM | POA: Diagnosis not present

## 2015-03-30 DIAGNOSIS — M542 Cervicalgia: Secondary | ICD-10-CM | POA: Diagnosis not present

## 2015-03-30 DIAGNOSIS — M5030 Other cervical disc degeneration, unspecified cervical region: Secondary | ICD-10-CM | POA: Insufficient documentation

## 2015-03-30 DIAGNOSIS — Z79899 Other long term (current) drug therapy: Secondary | ICD-10-CM | POA: Diagnosis not present

## 2015-03-30 LAB — CBC WITH DIFFERENTIAL/PLATELET
Basophils Absolute: 0 10*3/uL (ref 0.0–0.1)
Basophils Relative: 0 %
EOS ABS: 0.1 10*3/uL (ref 0.0–0.7)
Eosinophils Relative: 1 %
HEMATOCRIT: 34 % — AB (ref 36.0–46.0)
HEMOGLOBIN: 11.3 g/dL — AB (ref 12.0–15.0)
LYMPHS ABS: 2.6 10*3/uL (ref 0.7–4.0)
Lymphocytes Relative: 26 %
MCH: 30.1 pg (ref 26.0–34.0)
MCHC: 33.2 g/dL (ref 30.0–36.0)
MCV: 90.7 fL (ref 78.0–100.0)
MONOS PCT: 9 %
Monocytes Absolute: 0.9 10*3/uL (ref 0.1–1.0)
NEUTROS ABS: 6.5 10*3/uL (ref 1.7–7.7)
NEUTROS PCT: 64 %
Platelets: 127 10*3/uL — ABNORMAL LOW (ref 150–400)
RBC: 3.75 MIL/uL — ABNORMAL LOW (ref 3.87–5.11)
RDW: 12.9 % (ref 11.5–15.5)
WBC: 10.1 10*3/uL (ref 4.0–10.5)

## 2015-03-30 LAB — I-STAT CHEM 8, ED
BUN: 22 mg/dL — AB (ref 6–20)
CALCIUM ION: 1.19 mmol/L (ref 1.13–1.30)
CREATININE: 1 mg/dL (ref 0.44–1.00)
Chloride: 101 mmol/L (ref 101–111)
GLUCOSE: 105 mg/dL — AB (ref 65–99)
HCT: 35 % — ABNORMAL LOW (ref 36.0–46.0)
Hemoglobin: 11.9 g/dL — ABNORMAL LOW (ref 12.0–15.0)
Potassium: 3.8 mmol/L (ref 3.5–5.1)
Sodium: 139 mmol/L (ref 135–145)
TCO2: 25 mmol/L (ref 0–100)

## 2015-03-30 LAB — CK TOTAL AND CKMB (NOT AT ARMC)
CK TOTAL: 28 U/L — AB (ref 38–234)
CK, MB: 0.9 ng/mL (ref 0.5–5.0)
Relative Index: INVALID (ref 0.0–2.5)

## 2015-03-30 NOTE — ED Notes (Signed)
Pt comes to ed via EMS, c/o of neck pain. Pt can not move her neck and states its sore and swollen. Hx of HTN reported.  No falls or recent injuries she reports. Just cant move her neck properly.

## 2015-03-30 NOTE — ED Notes (Signed)
Bed: [id number] Expected date:  Expected time:  Means of arrival:  Comments: F neck pain

## 2015-03-30 NOTE — ED Provider Notes (Signed)
CSN: [id number]     Arrival date & time 03/30/15  0046 History   First MD Initiated Contact with Patient 03/30/15 0103     Chief Complaint  Patient presents with  . Neck Pain     (Consider location/radiation/quality/duration/timing/severity/associated sxs/prior Treatment) HPI Comments: This 70 year old, female, who presents to the emergency department with complaint of bilateral neck pain.  She states it hurts to move.  Denies any trauma fan/draft blowing on her previous history of same. No recent illness/fever/sore throat She states she took half of Zanaflex and then a full Zanaflex with no relief.  She was given Zanaflex for low back/hip pain by her primary care writer.  She has not taken any Tylenol or ibuprofen. She does take Crestor for cholesterol as well as Lasix, Synthroid, Prilosec, vitamin D, Xanax, atenolol and Lotensin  Patient is a 70 y.o. female presenting with neck pain. The history is provided by the patient.  Neck Pain Pain location:  Generalized neck Quality:  Aching Pain radiates to:  Does not radiate Pain severity:  Moderate Pain is:  Same all the time Onset quality:  Gradual Timing:  Constant Progression:  Unchanged Chronicity:  New Context: not fall, not jumping from heights, not lifting a heavy object, not MCA, not MVA, not pedestrian accident and not recent injury   Relieved by:  Nothing Worsened by:  Position and swallowing Ineffective treatments:  Muscle relaxants   Past Medical History  Diagnosis Date  . Hyperlipidemia   . Hypertension   . Unspecified hypothyroidism   . Other abnormal glucose   . Unspecified vitamin D deficiency   . GERD (gastroesophageal reflux disease)    Past Surgical History  Procedure Laterality Date  . Tubal ligation    . Eye surgery    . Abdominal hysterectomy    . Carpal tunnel release Bilateral 2001  . Nasal septum surgery     Family History  Problem Relation Age of Onset  . Hypertension Mother   . Hyperlipidemia  Mother   . Heart disease Father   . Alcohol abuse Father   . Hyperlipidemia Father   . Hypertension Father   . Heart disease Sister   . Stroke Sister   . Depression Sister   . Heart disease Brother    Social History  Substance Use Topics  . Smoking status: Former Smoker    Quit date: 03/20/1988  . Smokeless tobacco: None  . Alcohol Use: Yes     Comment: rare glass of wine   OB History    No data available     Review of Systems  HENT: Positive for trouble swallowing. Negative for congestion, dental problem, rhinorrhea, sore throat and voice change.   Respiratory: Negative for cough and shortness of breath.   Gastrointestinal: Negative for abdominal pain.  Musculoskeletal: Positive for back pain and neck pain.  Skin: Negative for wound.  All other systems reviewed and are negative.     Allergies  Lopid; Morphine and related; Nickel; and Latex  Home Medications   Prior to Admission medications   Medication Sig Start Date End Date Taking? Authorizing Provider  ALPRAZolam Duanne Moron) 1 MG tablet TAKE 1 TABLET AT BEDTIME Patient taking differently: TAKE 1 MG BY MOUTH  AT BEDTIME 07/10/14  Yes Vicie Mutters, PA-C  atenolol (TENORMIN) 100 MG tablet Take 1 tablet (100 mg total) by mouth daily. 10/16/14  Yes Courtney Forcucci, PA-C  benazepril (LOTENSIN) 20 MG tablet TAKE 1 TABLET EVERY DAY Patient taking differently: TAKE 20 MG  BY MOUTH EVERY DAY 02/25/15  Yes Unk Pinto, MD  Cholecalciferol (D 5000) 5000 UNITS capsule Take 5,000 Units by mouth daily.   Yes Historical Provider, MD  estradiol (ESTRACE) 1 MG tablet Take 1 tablet (1 mg total) by mouth daily. 09/04/14  Yes Unk Pinto, MD  furosemide (LASIX) 80 MG tablet TAKE 1 TABLET EVERY DAY Patient taking differently: TAKE 40 MG BY MOUTH  EVERY DAY 11/05/14  Yes Unk Pinto, MD  levothyroxine (SYNTHROID, LEVOTHROID) 50 MCG tablet TAKE 1 TABLET EVERY DAY BEFORE BREAKFAST Patient taking differently: TAKE 50 MCG EVERY DAY  BEFORE BREAKFAST 01/23/15  Yes Unk Pinto, MD  Magnesium 500 MG TABS Take 500 mg by mouth daily.   Yes Historical Provider, MD  omeprazole (PRILOSEC) 20 MG capsule Take 1 capsule (20 mg total) by mouth daily. 12/03/14  Yes Unk Pinto, MD  rosuvastatin (CRESTOR) 40 MG tablet TAKE 1/2 TO 1 TABLET DAILY FOR CHOLESTEROL AS DIRECTED Patient taking differently: Take 20-40 mg by mouth daily.  10/26/14  Yes Unk Pinto, MD  tiZANidine (ZANAFLEX) 4 MG tablet TAKE 1 TABLET BY MOUTH EVERY 8 HOURS AS NEEDED FOR MUSCLE SPASMS Patient taking differently: Take 4 mg by mouth every 8 (eight) hours as needed for muscle spasms.  12/17/14  Yes Unk Pinto, MD   BP 98/55 mmHg  Pulse 67  Temp(Src) 97.9 F (36.6 C)  Resp 15  SpO2 96% Physical Exam  Constitutional: She appears well-developed and well-nourished.  HENT:  Head: Normocephalic and atraumatic.  Right Ear: External ear normal.  Left Ear: External ear normal.  Eyes: Pupils are equal, round, and reactive to light.  Neck: Muscular tenderness present. No spinous process tenderness present. No rigidity. Decreased range of motion present. No edema and no erythema present.  Cardiovascular: Normal rate and regular rhythm.   Pulmonary/Chest: Effort normal.  Abdominal: Soft.  Musculoskeletal: She exhibits tenderness.       Back:  Lymphadenopathy:    She has no cervical adenopathy.  Neurological: She is alert.  Skin: Skin is warm. No rash noted. No erythema.  Nursing note and vitals reviewed.   ED Course  Procedures (including critical care time) Labs Review Labs Reviewed  CK TOTAL AND CKMB (NOT AT [facility]) - Abnormal; Notable for the following:    Total CK 28 (*)    All other components within normal limits  CBC WITH DIFFERENTIAL/PLATELET - Abnormal; Notable for the following:    RBC 3.75 (*)    Hemoglobin 11.3 (*)    HCT 34.0 (*)    Platelets 127 (*)    All other components within normal limits  I-STAT CHEM 8, ED - Abnormal;  Notable for the following:    BUN 22 (*)    Glucose, Bld 105 (*)    Hemoglobin 11.9 (*)    HCT 35.0 (*)    All other components within normal limits    Imaging Review Ct Cervical Spine Wo Contrast  03/30/2015  CLINICAL DATA:  Neck pain. Difficulty with moving. Sore and swollen. EXAM: CT CERVICAL SPINE WITHOUT CONTRAST TECHNIQUE: Multidetector CT imaging of the cervical spine was performed without intravenous contrast. Multiplanar CT image reconstructions were also generated. COMPARISON:  None. FINDINGS: Normal alignment of the cervical spine. Diffuse degenerative changes with narrowed interspaces and endplate hypertrophic change. Degenerative changes at C1 to. Degenerative changes in the facet joints. No vertebral compression deformities. No prevertebral soft tissue swelling. Prominent posterior disc osteophyte complexes at C4-5, C5-6, and C6-7 could cause some encroachment upon the  anterior thecal sac. No focal bone lesion or bone destruction. C1-2 articulation appears intact. Vascular calcifications. Soft tissues are unremarkable. 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 4 mm nodule in the left lung apex. No follow-up needed if patient is low-risk. Non-contrast chest CT can be considered in 12 months if patient is high-risk. This recommendation follows the consensus statement: Guidelines for Management of Incidental Pulmonary Nodules Detected on CT Images:From the Fleischner Society 2017; published online before print (10.1148/radiol.IJ:2314499). IMPRESSION: Degenerative changes in the cervical spine. Normal alignment. No acute displaced fractures identified. Electronically Signed   By: Lucienne Capers M.D.   On: 03/30/2015 03:37   I have personally reviewed and evaluated these images and lab results as part of my medical decision-making.   EKG Interpretation None     This appears to be muscular in nature, but due to the fact that she takes Crestor.  We'll check CBC i-STAT and CK-MB Labs  within normal parameters.  CT of the neck shows that she discussed some degenerative changes in the facet joints.  Otherwise normal after being medicated.  She's feeling better.  Warm compresses seem to work the best. Have instructed her to take regular doses.  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

## 2015-03-30 NOTE — Discharge Instructions (Signed)
Continue taking your muscle relaxer are regular basis 2-3 times daily .  Warm compresses to the neck area as needed.  Make an appointment with your primary care physician for follow-up    Heat Therapy Heat therapy can help ease sore, stiff, injured, and tight muscles and joints. Heat relaxes your muscles, which may help ease your pain.  RISKS AND COMPLICATIONS If you have any of the following conditions, do not use heat therapy unless your health care provider has approved:  Poor circulation.  Healing wounds or scarred skin in the area being treated.  Diabetes, heart disease, or high blood pressure.  Not being able to feel (numbness) the area being treated.  Unusual swelling of the area being treated.  Active infections.  Blood clots.  Cancer.  Inability to communicate pain. This may include young children and people who have problems with their brain function (dementia).  Pregnancy. Heat therapy should only be used on old, pre-existing, or long-lasting (chronic) injuries. Do not use heat therapy on new injuries unless directed by your health care provider. HOW TO USE HEAT THERAPY There are several different kinds of heat therapy, including:  Moist heat pack.  Warm water bath.  Hot water bottle.  Electric heating pad.  Heated gel pack.  Heated wrap.  Electric heating pad. Use the heat therapy method suggested by your health care provider. Follow your health care provider's instructions on when and how to use heat therapy. GENERAL HEAT THERAPY RECOMMENDATIONS  Do not sleep while using heat therapy. Only use heat therapy while you are awake.  Your skin may turn pink while using heat therapy. Do not use heat therapy if your skin turns red.  Do not use heat therapy if you have new pain.  High heat or long exposure to heat can cause burns. Be careful when using heat therapy to avoid burning your skin.  Do not use heat therapy on areas of your skin that are already  irritated, such as with a rash or sunburn. SEEK MEDICAL CARE IF:  You have blisters, redness, swelling, or numbness.  You have new pain.  Your pain is worse. MAKE SURE YOU:  Understand these instructions.  Will watch your condition.  Will get help right away if you are not doing well or get worse.   This information is not intended to replace advice given to you by your health care provider. Make sure you discuss any questions you have with your health care provider.   Document Released: 03/16/2011 Document Revised: 01/12/2014 Document Reviewed: 02/14/2013 Elsevier Interactive Patient Education 2016 [employer]. Muscle Strain A muscle strain is an injury that occurs when a muscle is stretched beyond its normal length. Usually a small number of muscle fibers are torn when this happens. Muscle strain is rated in degrees. First-degree strains have the least amount of muscle fiber tearing and pain. Second-degree and third-degree strains have increasingly more tearing and pain.  Usually, recovery from muscle strain takes 1-2 weeks. Complete healing takes 5-6 weeks.  CAUSES  Muscle strain happens when a sudden, violent force placed on a muscle stretches it too far. This may occur with lifting, sports, or a fall.  RISK FACTORS Muscle strain is especially common in athletes.  SIGNS AND SYMPTOMS At the site of the muscle strain, there may be:  Pain.  Bruising.  Swelling.  Difficulty using the muscle due to pain or lack of normal function. DIAGNOSIS  Your health care provider will perform a physical exam and ask  about your medical history. TREATMENT  Often, the best treatment for a muscle strain is resting, icing, and applying cold compresses to the injured area.  HOME CARE INSTRUCTIONS   Use the PRICE method of treatment to promote muscle healing during the first 2-3 days after your injury. The PRICE method involves:  Protecting the muscle from being injured  again.  Restricting your activity and resting the injured body part.  Icing your injury. To do this, put ice in a plastic bag. Place a towel between your skin and the bag. Then, apply the ice and leave it on from 15-20 minutes each hour. After the third day, switch to moist heat packs.  Apply compression to the injured area with a splint or elastic bandage. Be careful not to wrap it too tightly. This may interfere with blood circulation or increase swelling.  Elevate the injured body part above the level of your heart as often as you can.  Only take over-the-counter or prescription medicines for pain, discomfort, or fever as directed by your health care provider.  Warming up prior to exercise helps to prevent future muscle strains. SEEK MEDICAL CARE IF:   You have increasing pain or swelling in the injured area.  You have numbness, tingling, or a significant loss of strength in the injured area. MAKE SURE YOU:   Understand these instructions.  Will watch your condition.  Will get help right away if you are not doing well or get worse.   This information is not intended to replace advice given to you by your health care provider. Make sure you discuss any questions you have with your health care provider.   Document Released: 12/22/2004 Document Revised: 10/12/2012 Document Reviewed: 07/21/2012 Elsevier Interactive Patient Education [employer].

## 2015-04-04 ENCOUNTER — Ambulatory Visit (INDEPENDENT_AMBULATORY_CARE_PROVIDER_SITE_OTHER): Payer: Commercial Managed Care - HMO | Admitting: Internal Medicine

## 2015-04-04 ENCOUNTER — Encounter: Payer: Self-pay | Admitting: Internal Medicine

## 2015-04-04 VITALS — BP 144/82 | HR 68 | Temp 98.0°F | Resp 16 | Ht 60.25 in | Wt 112.0 lb

## 2015-04-04 DIAGNOSIS — M6248 Contracture of muscle, other site: Secondary | ICD-10-CM

## 2015-04-04 DIAGNOSIS — M62838 Other muscle spasm: Secondary | ICD-10-CM

## 2015-04-04 MED ORDER — DEXAMETHASONE 0.75 MG PO TABS: ORAL_TABLET | ORAL | Status: DC

## 2015-04-04 NOTE — Progress Notes (Signed)
Assessment and Plan:   1. Muscle spasms of neck -decadron -cont zanaflex -heating pad -if no better in a week needs to have PT appointment  HPI 70 y.o.female presents for 5 day follow-up of bilateral trapezius pain after being seen in the ER for the same.    She reports that this has been going on for approximately 1 week.  Patient had CT scan of the neck and was diagnosed with degenerative changes and muscle spasms.  She is currently taking zanaflex and is also taking ibuprofen. She is also using warm compresses.  Patient reports that they have been doing well.  female is taking their medication.  They are not having difficulty with their medications.  They report no adverse reactions.  She reports that she has not been sleeping well, and she has been trying to keep her head still.  She is still taking the muscle relaxer and also using the heating pads.     Past Medical History  Diagnosis Date  . Hyperlipidemia   . Hypertension   . Unspecified hypothyroidism   . Other abnormal glucose   . Unspecified vitamin D deficiency   . GERD (gastroesophageal reflux disease)      Allergies  Allergen Reactions  . Lopid [Gemfibrozil]     headache  . Morphine And Related Other (See Comments)    Reaction: unknown   . Nickel Other (See Comments)    Reaction: unknown   . Latex Rash      Current Outpatient Prescriptions on File Prior to Visit  Medication Sig Dispense Refill  . ALPRAZolam (XANAX) 1 MG tablet TAKE 1 TABLET AT BEDTIME (Patient taking differently: TAKE 1 MG BY MOUTH  AT BEDTIME) 90 tablet 1  . atenolol (TENORMIN) 100 MG tablet Take 1 tablet (100 mg total) by mouth daily. 90 tablet 4  . benazepril (LOTENSIN) 20 MG tablet TAKE 1 TABLET EVERY DAY (Patient taking differently: No sig reported) 90 tablet 1  . Cholecalciferol (D 5000) 5000 UNITS capsule Take 5,000 Units by mouth daily.    Marland Kitchen estradiol (ESTRACE) 1 MG tablet Take 1 tablet (1 mg total) by mouth daily. 90 tablet 4  .  furosemide (LASIX) 80 MG tablet TAKE 1 TABLET EVERY DAY (Patient taking differently: TAKE 40 MG BY MOUTH  EVERY DAY) 90 tablet 4  . levothyroxine (SYNTHROID, LEVOTHROID) 50 MCG tablet TAKE 1 TABLET EVERY DAY BEFORE BREAKFAST (Patient taking differently: TAKE 50 MCG EVERY DAY BEFORE BREAKFAST) 90 tablet 1  . Magnesium 500 MG TABS Take 500 mg by mouth daily.    Marland Kitchen omeprazole (PRILOSEC) 20 MG capsule Take 1 capsule (20 mg total) by mouth daily. 90 capsule 2  . rosuvastatin (CRESTOR) 40 MG tablet TAKE 1/2 TO 1 TABLET DAILY FOR CHOLESTEROL AS DIRECTED (Patient taking differently: Take 20-40 mg by mouth daily. ) 90 tablet 1  . tiZANidine (ZANAFLEX) 4 MG tablet TAKE 1 TABLET BY MOUTH EVERY 8 HOURS AS NEEDED FOR MUSCLE SPASMS (Patient taking differently: Take 4 mg by mouth every 8 (eight) hours as needed for muscle spasms. ) 270 tablet 1   No current facility-administered medications on file prior to visit.    ROS: all negative except above.   Physical Exam: Filed Weights   04/04/15 1347  Weight: 112 lb (50.803 kg)   BP 144/82 mmHg  Pulse 68  Temp(Src) 98 F (36.7 C) (Temporal)  Resp 16  Ht 5' 0.25" (1.53 m)  Wt 112 lb (50.803 kg)  BMI 21.70 kg/m2 General Appearance:  Well developed well nourished, non-toxic appearing in no apparent distress. Eyes: PERRLA, EOMs, conjunctiva w/ no swelling or erythema or discharge Sinuses: No Frontal/maxillary tenderness ENT/Mouth: Ear canals clear without swelling or erythema.  TM's normal bilaterally with no retractions, bulging, or loss of landmarks.   Neck: Supple, thyroid normal, no notable JVD  Respiratory: Respiratory effort normal, Clear breath sounds anteriorly and posteriorly bilaterally without rales, rhonchi, wheezing or stridor. No retractions or accessory muscle usage. Cardio: RRR with no MRGs.   Abdomen: Soft, + BS.  Non tender, no guarding, rebound, hernias, masses.  Musculoskeletal: Right trapezius with palpable spasm.  Limited ROM, pain with  lateral bending and twisting.  Neurovascularly intact.  Skin: Warm, dry without rashes  Neuro: Awake and oriented X 3, Cranial nerves intact. Normal muscle tone, no cerebellar symptoms. Sensation intact.  Psych: normal affect, Insight and Judgment appropriate.     Starlyn Skeans, PA-C 1:55 PM [facility]

## 2015-04-04 NOTE — Patient Instructions (Signed)
Please use self heating neck wraps.  [employer] makes a version and there is also another version.  You can find these in the over the counter pharmacy.    Please take the decadron until it is gone.  Please take 1/2 tablet of your xanax with your evening dose of your muscle relaxer tizanidine.

## 2015-04-22 ENCOUNTER — Other Ambulatory Visit: Payer: Self-pay | Admitting: Internal Medicine

## 2015-05-29 ENCOUNTER — Encounter: Payer: Self-pay | Admitting: Internal Medicine

## 2015-05-30 ENCOUNTER — Encounter: Payer: Self-pay | Admitting: Internal Medicine

## 2015-05-30 ENCOUNTER — Ambulatory Visit (INDEPENDENT_AMBULATORY_CARE_PROVIDER_SITE_OTHER): Payer: Commercial Managed Care - HMO | Admitting: Internal Medicine

## 2015-05-30 VITALS — BP 126/62 | HR 54 | Temp 98.0°F | Resp 16 | Ht 60.25 in | Wt 108.0 lb

## 2015-05-30 DIAGNOSIS — M542 Cervicalgia: Secondary | ICD-10-CM | POA: Diagnosis not present

## 2015-05-30 MED ORDER — TRAMADOL HCL 50 MG PO TABS: 100.0000 mg | ORAL_TABLET | Freq: Four times a day (QID) | ORAL | Status: DC | PRN

## 2015-05-30 NOTE — Progress Notes (Signed)
   Subjective:    Patient ID: Stacy Wallace, female    DOB: November 24, 1945, 70 y.o.   MRN: [id number]  Neck Pain  Associated symptoms include numbness. Pertinent negatives include no chest pain or fever.   Patient presents to the office for evaluation of neck pain and swelling in the left side of the neck which has been increasing since March.  She notes that she is still being bothered by it.  She notes that it did get slightly better after the steroid, but is still having to take tizanidine.  She reports that she is still not better.  She reports that she hasn't seen Dr. Mina Marble who does her epidural injections.  She did see him in early March for a lumbar injection. Nothing much has changes since last visit.  She has no explanation as to why it has taken her this long to get back in.    Review of Systems  Constitutional: Negative for fever, chills and fatigue.  HENT: Negative for congestion, ear discharge, mouth sores, sore throat and voice change.   Respiratory: Negative for chest tightness and shortness of breath.   Cardiovascular: Negative for chest pain and palpitations.  Musculoskeletal: Positive for back pain and neck pain.  Neurological: Positive for numbness.       Objective:   Physical Exam  Constitutional: She is oriented to person, place, and time. She appears well-developed and well-nourished. No distress.  HENT:  Head: Normocephalic.  Mouth/Throat: Oropharynx is clear and moist. No oropharyngeal exudate.  Eyes: Conjunctivae are normal. No scleral icterus.  Neck: Normal range of motion. Neck supple. No JVD present. No thyromegaly present.  Cardiovascular: Normal rate, regular rhythm, normal heart sounds and intact distal pulses.  Exam reveals no gallop and no friction rub.   No murmur heard. Pulmonary/Chest: Effort normal and breath sounds normal. No respiratory distress. She has no wheezes. She has no rales. She exhibits no tenderness.  Abdominal: Soft. Bowel sounds are normal.  She exhibits no distension and no mass. There is no tenderness. There is no rebound and no guarding.  Musculoskeletal:       Cervical back: She exhibits decreased range of motion, tenderness and spasm. She exhibits no bony tenderness, no swelling, no edema, no deformity, no laceration, no pain and normal pulse.       Back:  Lymphadenopathy:    She has no cervical adenopathy.  Neurological: She is alert and oriented to person, place, and time. She has normal strength. No cranial nerve deficit or sensory deficit. Coordination normal.  Skin: Skin is warm and dry. She is not diaphoretic.  Psychiatric: She has a normal mood and affect. Her behavior is normal. Judgment and thought content normal.  Nursing note and vitals reviewed.   Filed Vitals:   05/30/15 1614  BP: 126/62  Pulse: 54  Temp: 98 F (36.7 C)  Resp: 16          Assessment & Plan:    1. Cervicalgia -ultram -will order PT - Ambulatory referral to Orthopedics

## 2015-06-10 ENCOUNTER — Other Ambulatory Visit: Payer: Self-pay | Admitting: Internal Medicine

## 2015-06-24 ENCOUNTER — Ambulatory Visit (INDEPENDENT_AMBULATORY_CARE_PROVIDER_SITE_OTHER): Payer: Commercial Managed Care - HMO | Admitting: Internal Medicine

## 2015-06-24 ENCOUNTER — Encounter: Payer: Self-pay | Admitting: Internal Medicine

## 2015-06-24 VITALS — BP 128/70 | HR 60 | Temp 97.6°F | Resp 16 | Ht 61.0 in | Wt 106.6 lb

## 2015-06-24 DIAGNOSIS — E039 Hypothyroidism, unspecified: Secondary | ICD-10-CM | POA: Diagnosis not present

## 2015-06-24 DIAGNOSIS — K219 Gastro-esophageal reflux disease without esophagitis: Secondary | ICD-10-CM

## 2015-06-24 DIAGNOSIS — I10 Essential (primary) hypertension: Secondary | ICD-10-CM | POA: Diagnosis not present

## 2015-06-24 DIAGNOSIS — N6012 Diffuse cystic mastopathy of left breast: Secondary | ICD-10-CM

## 2015-06-24 DIAGNOSIS — R7303 Prediabetes: Secondary | ICD-10-CM

## 2015-06-24 DIAGNOSIS — E785 Hyperlipidemia, unspecified: Secondary | ICD-10-CM | POA: Diagnosis not present

## 2015-06-24 DIAGNOSIS — Z136 Encounter for screening for cardiovascular disorders: Secondary | ICD-10-CM | POA: Diagnosis not present

## 2015-06-24 DIAGNOSIS — Z0000 Encounter for general adult medical examination without abnormal findings: Secondary | ICD-10-CM | POA: Diagnosis not present

## 2015-06-24 DIAGNOSIS — Z0001 Encounter for general adult medical examination with abnormal findings: Secondary | ICD-10-CM | POA: Diagnosis not present

## 2015-06-24 DIAGNOSIS — N6011 Diffuse cystic mastopathy of right breast: Secondary | ICD-10-CM | POA: Diagnosis not present

## 2015-06-24 DIAGNOSIS — R6889 Other general symptoms and signs: Secondary | ICD-10-CM | POA: Diagnosis not present

## 2015-06-24 DIAGNOSIS — E559 Vitamin D deficiency, unspecified: Secondary | ICD-10-CM

## 2015-06-24 DIAGNOSIS — Z79899 Other long term (current) drug therapy: Secondary | ICD-10-CM | POA: Diagnosis not present

## 2015-06-24 DIAGNOSIS — Z1212 Encounter for screening for malignant neoplasm of rectum: Secondary | ICD-10-CM

## 2015-06-24 DIAGNOSIS — R7309 Other abnormal glucose: Secondary | ICD-10-CM | POA: Diagnosis not present

## 2015-06-24 LAB — CBC WITH DIFFERENTIAL/PLATELET
Basophils Absolute: 0 cells/uL (ref 0–200)
Basophils Relative: 0 %
EOS PCT: 1 %
Eosinophils Absolute: 72 cells/uL (ref 15–500)
HCT: 41.4 % (ref 35.0–45.0)
Hemoglobin: 13.6 g/dL (ref 11.7–15.5)
LYMPHS PCT: 37 %
Lymphs Abs: 2664 cells/uL (ref 850–3900)
MCH: 29.6 pg (ref 27.0–33.0)
MCHC: 32.9 g/dL (ref 32.0–36.0)
MCV: 90.2 fL (ref 80.0–100.0)
MPV: 10.2 fL (ref 7.5–12.5)
Monocytes Absolute: 360 cells/uL (ref 200–950)
Monocytes Relative: 5 %
NEUTROS ABS: 4104 {cells}/uL (ref 1500–7800)
NEUTROS PCT: 57 %
Platelets: 173 10*3/uL (ref 140–400)
RBC: 4.59 MIL/uL (ref 3.80–5.10)
RDW: 13.5 % (ref 11.0–15.0)
WBC: 7.2 10*3/uL (ref 3.8–10.8)

## 2015-06-24 LAB — HEMOGLOBIN A1C
Hgb A1c MFr Bld: 5.9 % — ABNORMAL HIGH (ref <5.7)
MEAN PLASMA GLUCOSE: 123 mg/dL

## 2015-06-24 LAB — TSH: TSH: 0.85 mIU/L

## 2015-06-24 NOTE — Patient Instructions (Signed)
Recommend Adult Low Dose Aspirin or   coated  Aspirin 81 mg daily   To reduce risk of Colon Cancer 20 %,   Skin Cancer 26 % ,   Melanoma 46%   and   Pancreatic cancer 60%   ++++++++++++++++++++++++++++++++++++++++++++++++++++++ Vitamin D goal   is between 70-100.   Please make sure that you are taking your Vitamin D as directed.   It is very important as a natural anti-inflammatory   helping hair, skin, and nails, as well as reducing stroke and heart attack risk.   It helps your bones and helps with mood.  It also decreases numerous cancer risks so please take it as directed.   Low Vit D is associated with a 200-300% higher risk for CANCER   and 200-300% higher risk for HEART   ATTACK  &  STROKE.   .....................................Stacy Wallace  It is also associated with higher death rate at younger ages,   autoimmune diseases like Rheumatoid arthritis, Lupus, Multiple Sclerosis.     Also many other serious conditions, like depression, Alzheimer's  Dementia, infertility, muscle aches, fatigue, fibromyalgia - just to name a few.  ++++++++++++++++++++++++++++++++++++++++++++++++  Recommend the book "The END of DIETING" by Dr Excell Seltzer   & the book "The END of DIABETES " by Dr Excell Seltzer  At [email or web address] - get book & Audio CD's     Being diabetic has a  300% increased risk for heart attack, stroke, cancer, and alzheimer- type vascular dementia. It is very important that you work harder with diet by avoiding all foods that are white. Avoid white rice (brown & wild rice is OK), white potatoes (sweetpotatoes in moderation is OK), White bread or wheat bread or anything made out of white flour like bagels, donuts, rolls, buns, biscuits, cakes, pastries, cookies, pizza crust, and pasta (made from white flour & egg whites) - vegetarian pasta or spinach or wheat pasta is OK. Multigrain breads like Arnold's or Pepperidge [employer], or multigrain sandwich thins or flatbreads.  Diet,  exercise and weight loss can reverse and cure diabetes in the early stages.  Diet, exercise and weight loss is very important in the control and prevention of complications of diabetes which affects every system in your body, ie. Brain - dementia/stroke, eyes - glaucoma/blindness, heart - heart attack/heart failure, kidneys - dialysis, stomach - gastric paralysis, intestines - malabsorption, nerves - severe painful neuritis, circulation - gangrene & loss of a leg(s), and finally cancer and Alzheimers.    I recommend avoid fried & greasy foods,  sweets/candy, white rice (brown or wild rice or Quinoa is OK), white potatoes (sweet potatoes are OK) - anything made from white flour - bagels, doughnuts, rolls, buns, biscuits,white and wheat breads, pizza crust and traditional pasta made of white flour & egg white(vegetarian pasta or spinach or wheat pasta is OK).  Multi-grain bread is OK - like multi-grain flat bread or sandwich thins. Avoid alcohol in excess. Exercise is also important.    Eat all the vegetables you want - avoid meat, especially red meat and dairy - especially cheese.  Cheese is the most concentrated form of trans-fats which is the worst thing to clog up our arteries. Veggie cheese is OK which can be found in the fresh produce section at [employer] or [employer] or [employer]  ++++++++++++++++++++++++++++++++++++++++++++++++++ DASH Eating Plan  DASH stands for "Dietary Approaches to Stop Hypertension."   The DASH eating plan is a healthy eating plan that has been shown to reduce high blood  pressure (hypertension). Additional health benefits may include reducing the risk of type 2 diabetes mellitus, heart disease, and stroke. The DASH eating plan may also help with weight loss.  WHAT DO I NEED TO KNOW ABOUT THE DASH EATING PLAN?  For the DASH eating plan, you will follow these general guidelines:  Choose foods with a percent daily value for sodium of less than 5% (as listed on the food  label).  Use salt-free seasonings or herbs instead of table salt or sea salt.  Check with your health care provider or pharmacist before using salt substitutes.  Eat lower-sodium products, often labeled as "lower sodium" or "no salt added."  Eat fresh foods.  Eat more vegetables, fruits, and low-fat dairy products.    Choose whole grains. Look for the word "whole" as the first word in the ingredient list.  Choose fish   Limit sweets, desserts, sugars, and sugary drinks.  Choose heart-healthy fats.  Eat veggie cheese   Eat more home-cooked food and less restaurant, buffet, and fast food.  Limit fried foods.  [place]uffaker foods using methods other than frying.  Limit canned vegetables. If you do use them, rinse them well to decrease the sodium.  When eating at a restaurant, ask that your food be prepared with less salt, or no salt if possible.                      WHAT FOODS CAN I EAT?  Read Dr Fara Olden Fuhrman's books on The End of Dieting & The End of Diabetes  Grains  Whole grain or whole wheat bread. Brown rice. Whole grain or whole wheat pasta. Quinoa, bulgur, and whole grain cereals. Low-sodium cereals. Corn or whole wheat flour tortillas. Whole grain cornbread. Whole grain crackers. Low-sodium crackers.  Vegetables  Fresh or frozen vegetables (raw, steamed, roasted, or grilled). Low-sodium or reduced-sodium tomato and vegetable juices. Low-sodium or reduced-sodium tomato sauce and paste. Low-sodium or reduced-sodium canned vegetables.   Fruits  All fresh, canned (in natural juice), or frozen fruits.  Protein Products   All fish and seafood.  Dried beans, peas, or lentils. Unsalted nuts and seeds. Unsalted canned beans.  Dairy  Low-fat dairy products, such as skim or 1% milk, 2% or reduced-fat cheeses, low-fat ricotta or cottage cheese, or plain low-fat yogurt. Low-sodium or reduced-sodium cheeses.  Fats and Oils  Tub margarines without trans fats. Light or  reduced-fat mayonnaise and salad dressings (reduced sodium). Avocado. Safflower, olive, or canola oils. Natural peanut or almond butter.  Other  Unsalted popcorn and pretzels. The items listed above may not be a complete list of recommended foods or beverages. Contact your dietitian for more options.  +++++++++++++++++++++++++++++++++++++++++++  WHAT FOODS ARE NOT RECOMMENDED?  Grains/ White flour or wheat flour  White bread. White pasta. White rice. Refined cornbread. Bagels and croissants. Crackers that contain trans fat.  Vegetables  Creamed or fried vegetables. Vegetables in a . Regular canned vegetables. Regular canned tomato sauce and paste. Regular tomato and vegetable juices.  Fruits  Dried fruits. Canned fruit in light or heavy syrup. Fruit juice.  Meat and Other Protein Products  Meat in general - RED mwaet & White meat.  Fatty cuts of meat. Ribs, chicken wings, bacon, sausage, bologna, salami, chitterlings, fatback, hot dogs, bratwurst, and packaged luncheon meats.  Dairy  Whole or 2% milk, cream, half-and-half, and cream cheese. Whole-fat or sweetened yogurt. Full-fat cheeses or blue cheese. Nondairy creamers and whipped toppings. Processed cheese, cheese spreads, or  cheese curds.  Condiments  Onion and garlic salt, seasoned salt, table salt, and sea salt. Canned and packaged gravies. Worcestershire sauce. Tartar sauce. Barbecue sauce. Teriyaki sauce. Soy sauce, including reduced sodium. Steak sauce. Fish sauce. Oyster sauce. Cocktail sauce. Horseradish. Ketchup and mustard. Meat flavorings and tenderizers. Bouillon cubes. Hot sauce. Tabasco sauce. Marinades. Taco seasonings. Relishes.  Fats and Oils Butter, stick margarine, lard, shortening and bacon fat. Coconut, palm kernel, or palm oils. Regular salad dressings.  Pickles and olives. Salted popcorn and pretzels.  The items listed above may not be a complete list of foods and beverages to avoid.   Preventive  Care for Adults  A healthy lifestyle and preventive care can promote health and wellness. Preventive health guidelines for women include the following key practices.  A routine yearly physical is a good way to check with your health care provider about your health and preventive screening. It is a chance to share any concerns and updates on your health and to receive a thorough exam.  Visit your dentist for a routine exam and preventive care every 6 months. Brush your teeth twice a day and floss once a day. Good oral hygiene prevents tooth decay and gum disease.  The frequency of eye exams is based on your age, health, family medical history, use of contact lenses, and other factors. Follow your health care provider's recommendations for frequency of eye exams.  Eat a healthy diet. Foods like vegetables, fruits, whole grains, low-fat dairy products, and lean protein foods contain the nutrients you need without too many calories. Decrease your intake of foods high in solid fats, added sugars, and salt. Eat the right amount of calories for you.Get information about a proper diet from your health care provider, if necessary.  Regular physical exercise is one of the most important things you can do for your health. Most adults should get at least 150 minutes of moderate-intensity exercise (any activity that increases your heart rate and causes you to sweat) each week. In addition, most adults need muscle-strengthening exercises on 2 or more days a week.  Maintain a healthy weight. The body mass index (BMI) is a screening tool to identify possible weight problems. It provides an estimate of body fat based on height and weight. Your health care provider can find your BMI and can help you achieve or maintain a healthy weight.For adults 20 years and older:  A BMI below 18.5 is considered underweight.  A BMI of 18.5 to 24.9 is normal.  A BMI of 25 to 29.9 is considered overweight.  A BMI of 30 and  above is considered obese.  Maintain normal blood lipids and cholesterol levels by exercising and minimizing your intake of saturated fat. Eat a balanced diet with plenty of fruit and vegetables. If your lipid or cholesterol levels are high, you are over 50, or you are at high risk for heart disease, you may need your cholesterol levels checked more frequently.Ongoing high lipid and cholesterol levels should be treated with medicines if diet and exercise are not working.  If you smoke, find out from your health care provider how to quit. If you do not use tobacco, do not start.  Lung cancer screening is recommended for adults aged 56-80 years who are at high risk for developing lung cancer because of a history of smoking. A yearly low-dose CT scan of the lungs is recommended for people who have at least a 30-pack-year history of smoking and are a current smoker or  have quit within the past 15 years. A pack year of smoking is smoking an average of 1 pack of cigarettes a day for 1 year (for example: 1 pack a day for 30 years or 2 packs a day for 15 years). Yearly screening should continue until the smoker has stopped smoking for at least 15 years. Yearly screening should be stopped for people who develop a health problem that would prevent them from having lung cancer treatment.  Avoid use of street drugs. Do not share needles with anyone. Ask for help if you need support or instructions about stopping the use of drugs.  High blood pressure causes heart disease and increases the risk of stroke.  Ongoing high blood pressure should be treated with medicines if weight loss and exercise do not work.  If you are 52-67 years old, ask your health care provider if you should take aspirin to prevent strokes.  Diabetes screening involves taking a blood sample to check your fasting blood sugar level. This should be done once every 3 years, after age 9, if you are within normal weight and without risk factors for  diabetes. Testing should be considered at a younger age or be carried out more frequently if you are overweight and have at least 1 risk factor for diabetes.  Breast cancer screening is essential preventive care for women. You should practice "breast self-awareness." This means understanding the normal appearance and feel of your breasts and may include breast self-examination. Any changes detected, no matter how small, should be reported to a health care provider. Women in their 45s and 30s should have a clinical breast exam (CBE) by a health care provider as part of a regular health exam every 1 to 3 years. After age 76, women should have a CBE every year. Starting at age 54, women should consider having a mammogram (breast X-ray test) every year. Women who have a family history of breast cancer should talk to their health care provider about genetic screening. Women at a high risk of breast cancer should talk to their health care providers about having an MRI and a mammogram every year.  Breast cancer gene (BRCA)-related cancer risk assessment is recommended for women who have family members with BRCA-related cancers. BRCA-related cancers include breast, ovarian, tubal, and peritoneal cancers. Having family members with these cancers may be associated with an increased risk for harmful changes (mutations) in the breast cancer genes BRCA1 and BRCA2. Results of the assessment will determine the need for genetic counseling and BRCA1 and BRCA2 testing.  Routine pelvic exams to screen for cancer are no longer recommended for nonpregnant women who are considered low risk for cancer of the pelvic organs (ovaries, uterus, and vagina) and who do not have symptoms. Ask your health care provider if a screening pelvic exam is right for you.  If you have had past treatment for cervical cancer or a condition that could lead to cancer, you need Pap tests and screening for cancer for at least 20 years after your  treatment. If Pap tests have been discontinued, your risk factors (such as having a new sexual partner) need to be reassessed to determine if screening should be resumed. Some women have medical problems that increase the chance of getting cervical cancer. In these cases, your health care provider may recommend more frequent screening and Pap tests.    Colorectal cancer can be detected and often prevented. Most routine colorectal cancer screening begins at the age of 73 years and  continues through age 75 years. However, your health care provider may recommend screening at an earlier age if you have risk factors for colon cancer. On a yearly basis, your health care provider may provide home test kits to check for hidden blood in the stool. Use of a small camera at the end of a tube, to directly examine the colon (sigmoidoscopy or colonoscopy), can detect the earliest forms of colorectal cancer. Talk to your health care provider about this at age 50, when routine screening begins. Direct exam of the colon should be repeated every 5-10 years through age 75 years, unless early forms of pre-cancerous polyps or small growths are found.  Osteoporosis is a disease in which the bones lose minerals and strength with aging. This can result in serious bone fractures or breaks. The risk of osteoporosis can be identified using a bone density scan. Women ages 65 years and over and women at risk for fractures or osteoporosis should discuss screening with their health care providers. Ask your health care provider whether you should take a calcium supplement or vitamin D to reduce the rate of osteoporosis.  Menopause can be associated with physical symptoms and risks. Hormone replacement therapy is available to decrease symptoms and risks. You should talk to your health care provider about whether hormone replacement therapy is right for you.  Use sunscreen. Apply sunscreen liberally and repeatedly throughout the day. You  should seek shade when your shadow is shorter than you. Protect yourself by wearing long sleeves, pants, a wide-brimmed hat, and sunglasses year round, whenever you are outdoors.  Once a month, do a whole body skin exam, using a mirror to look at the skin on your back. Tell your health care provider of new moles, moles that have irregular borders, moles that are larger than a pencil eraser, or moles that have changed in shape or color.  Stay current with required vaccines (immunizations).  Influenza vaccine. All adults should be immunized every year.  Tetanus, diphtheria, and acellular pertussis (Td, Tdap) vaccine. Pregnant women should receive 1 dose of Tdap vaccine during each pregnancy. The dose should be obtained regardless of the length of time since the last dose. Immunization is preferred during the 27th-36th week of gestation. An adult who has not previously received Tdap or who does not know her vaccine status should receive 1 dose of Tdap. This initial dose should be followed by tetanus and diphtheria toxoids (Td) booster doses every 10 years. Adults with an unknown or incomplete history of completing a 3-dose immunization series with Td-containing vaccines should begin or complete a primary immunization series including a Tdap dose. Adults should receive a Td booster every 10 years.    Zoster vaccine. One dose is recommended for adults aged 60 years or older unless certain conditions are present.    Pneumococcal 13-valent conjugate (PCV13) vaccine. When indicated, a person who is uncertain of her immunization history and has no record of immunization should receive the PCV13 vaccine. An adult aged 19 years or older who has certain medical conditions and has not been previously immunized should receive 1 dose of PCV13 vaccine. This PCV13 should be followed with a dose of pneumococcal polysaccharide (PPSV23) vaccine. The PPSV23 vaccine dose should be obtained at least 8 weeks after the dose  of PCV13 vaccine. An adult aged 19 years or older who has certain medical conditions and previously received 1 or more doses of PPSV23 vaccine should receive 1 dose of PCV13. The PCV13 vaccine dose should   be obtained 1 or more years after the last PPSV23 vaccine dose.    Pneumococcal polysaccharide (PPSV23) vaccine. When PCV13 is also indicated, PCV13 should be obtained first. All adults aged 65 years and older should be immunized. An adult younger than age 65 years who has certain medical conditions should be immunized. Any person who resides in a nursing home or long-term care facility should be immunized. An adult smoker should be immunized. People with an immunocompromised condition and certain other conditions should receive both PCV13 and PPSV23 vaccines. People with human immunodeficiency virus (HIV) infection should be immunized as soon as possible after diagnosis. Immunization during chemotherapy or radiation therapy should be avoided. Routine use of PPSV23 vaccine is not recommended for American Indians, [place] Natives, or people younger than 65 years unless there are medical conditions that require PPSV23 vaccine. When indicated, people who have unknown immunization and have no record of immunization should receive PPSV23 vaccine. One-time revaccination 5 years after the first dose of PPSV23 is recommended for people aged 19-64 years who have chronic kidney failure, nephrotic syndrome, asplenia, or immunocompromised conditions. People who received 1-2 doses of PPSV23 before age 65 years should receive another dose of PPSV23 vaccine at age 65 years or later if at least 5 years have passed since the previous dose. Doses of PPSV23 are not needed for people immunized with PPSV23 at or after age 65 years.   Preventive Services / Frequency  Ages 65 years and over  Blood pressure check.  Lipid and cholesterol check.  Lung cancer screening. / Every year if you are aged 55-80 years and have a  30-pack-year history of smoking and currently smoke or have quit within the past 15 years. Yearly screening is stopped once you have quit smoking for at least 15 years or develop a health problem that would prevent you from having lung cancer treatment.  Clinical breast exam.** / Every year after age 40 years.  BRCA-related cancer risk assessment.** / For women who have family members with a BRCA-related cancer (breast, ovarian, tubal, or peritoneal cancers).  Mammogram.** / Every year beginning at age 40 years and continuing for as long as you are in good health. Consult with your health care provider.  Pap test.** / Every 3 years starting at age 30 years through age 65 or 70 years with 3 consecutive normal Pap tests. Testing can be stopped between 65 and 70 years with 3 consecutive normal Pap tests and no abnormal Pap or HPV tests in the past 10 years.  Fecal occult blood test (FOBT) of stool. / Every year beginning at age 50 years and continuing until age 75 years. You may not need to do this test if you get a colonoscopy every 10 years.  Flexible sigmoidoscopy or colonoscopy.** / Every 5 years for a flexible sigmoidoscopy or every 10 years for a colonoscopy beginning at age 50 years and continuing until age 75 years.  Hepatitis C blood test.** / For all people born from 1945 through 1965 and any individual with known risks for hepatitis C.  Osteoporosis screening.** / A one-time screening for women ages 65 years and over and women at risk for fractures or osteoporosis.  Skin self-exam. / Monthly.  Influenza vaccine. / Every year.  Tetanus, diphtheria, and acellular pertussis (Tdap/Td) vaccine.** / 1 dose of Td every 10 years.  Zoster vaccine.** / 1 dose for adults aged 60 years or older.  Pneumococcal 13-valent conjugate (PCV13) vaccine.** / Consult your health care provider.    Pneumococcal polysaccharide (PPSV23) vaccine.** / 1 dose for all adults aged 65 years and older. Screening  for abdominal aortic aneurysm (AAA)  by ultrasound is recommended for people who have history of high blood pressure or who are current or former smokers. 

## 2015-06-24 NOTE — Progress Notes (Signed)
Patient ID: NIMAH ILL, female   DOB: 1945-01-22, 70 y.o.   MRN: [id number]  [facility] ADULT & ADOLESCENT INTERNAL MEDICINE                   Unk Pinto, M.D.    Uvaldo Bristle. Silverio Lay, P.A.-C      Starlyn Skeans, P.A.-C   [facility]                [address] [place], [place] [id number] Telephone [phone] Telefax [phone]  ______________________________________________________________________  Annual Screening/Preventative Visit And Comprehensive Evaluation &  Examination     This very nice 70 y.o.  DWF presents for a Preventative Visit & comprehensive evaluation and management of multiple medical co-morbidities.  Patient has been followed for HTN, Prediabetes, Hyperlipidemia and Vitamin D Deficiency.  Patient also has GERD controlled with diet and meds. Patient does relate undesired 8# weight loss over the last 6 months which she relates to dysgeusia.       HTN predates since 1999. Patient's BP has been controlled at home and patient denies any cardiac symptoms as chest pain, palpitations, shortness of breath, dizziness or ankle swelling. Today's BP: 128/70 mmHg      Patient's hyperlipidemia is controlled with diet and medications. Patient denies myalgias or other medication SE's. Last lipids were at goal with  Cholesterol 157; HDL 76; LDL 60; Triglycerides 104 on 03/18/2015.     Patient has prediabetes predating since 2011 with A1c 5.8%  And 5.9% in 2014 and patient denies reactive hypoglycemic symptoms, visual blurring, diabetic polys, or paresthesias. Last A1c was 6.1% on 03/18/2015.     Patient also is Hypothyroid and is periodically monitored for assurance of dosing. Finally, patient has history of Vitamin D Deficiency and last Vitamin D was elevated at 126 on 12/17/2014 and dose was tapered.   Medication Sig  . ALPRAZolam  1 MG TAKE 1 TABLET AT BEDTIME   . atenolol  100 MG Take 1 tab daily.  . benazepril  20 MG  TAKE 1  TABLET EVERY DAY  . Vit D 5000 UNITS  Take 5,000 Unitsdaily.  Marland Kitchen estradiol1 MG Take 1 tab daily.  . furosemide 80 MG  TAKE 1 TABEVERY DAY   . levothyroxine  50 MCG  TAKE 1 TAB EVERY DAY BEFORE BREAKFAST  . Magnesium 500 MG  Take  daily.  Marland Kitchen omeprazole  20 MG  Take 1 cap daily.  . rosuvastatin 40 MG  TAKE 1/2 TAB DAILY   . tiZANidine 4 MG  TAKE 1 TAB EVERY 8 HOURS AS NEEDED  . traMADol 50 MG Take 2 tab every 6  hours as needed. - not taking    Allergies  Allergen Reactions  . Lopid [Gemfibrozil]     headache  . Morphine And Related Other (See Comments)    Reaction: unknown   . Nickel Other (See Comments)    Reaction: unknown   . Latex Rash   Past Medical History  Diagnosis Date  . Hyperlipidemia   . Hypertension   . Hypothyroidism   . Other abnormal glucose   . Unspecified vitamin D deficiency   . GERD (gastroesophageal reflux disease)    Health Maintenance  Topic Date Due  . Hepatitis C Screening  Aug 11, 1945  . ZOSTAVAX  03/04/2003  . PNA vac Low Risk Adult (2 of 2 - PCV13) 03/18/2011  . MAMMOGRAM  06/18/2014  . INFLUENZA VACCINE  08/06/2015  . COLONOSCOPY  06/23/2016  . TETANUS/TDAP  11/08/2016  . DEXA SCAN  Completed   Immunization History  Administered Date(s) Administered  . Influenza, High Dose Seasonal PF 09/19/2013, 10/16/2014  . Influenza-Unspecified 09/15/2012  . Pneumococcal-Unspecified 03/18/2010  . Td 11/09/2006   Past Surgical History  Procedure Laterality Date  . Tubal ligation    . Eye surgery    . Abdominal hysterectomy    . Carpal tunnel release Bilateral 2001  . Nasal septum surgery     Family History  Problem Relation Age of Onset  . Hypertension Mother   . Hyperlipidemia Mother   . Heart disease Father   . Alcohol abuse Father   . Hyperlipidemia Father   . Hypertension Father   . Heart disease Sister   . Stroke Sister   . Depression Sister   . Heart disease Brother    Social History  Substance Use Topics  . Smoking status:  Former Smoker    Quit date: 03/20/1988  . Smokeless tobacco: Not on file  . Alcohol Use: Yes     Comment: rare glass of wine    ROS Constitutional: Denies fever, chills, weight loss/gain, headaches, insomnia,  night sweats, and change in appetite. Does c/o fatigue. Eyes: Denies redness, blurred vision, diplopia, discharge, itchy, watery eyes.  ENT: Denies discharge, congestion, post nasal drip, epistaxis, sore throat, earache, hearing loss, dental pain, Tinnitus, Vertigo, Sinus pain, snoring.  Cardio: Denies chest pain, palpitations, irregular heartbeat, syncope, dyspnea, diaphoresis, orthopnea, PND, claudication, edema Respiratory: denies cough, dyspnea, DOE, pleurisy, hoarseness, laryngitis, wheezing.  Gastrointestinal: Denies dysphagia, heartburn, reflux, water brash, pain, cramps, nausea, vomiting, bloating, diarrhea, constipation, hematemesis, melena, hematochezia, jaundice, hemorrhoids Genitourinary: Denies dysuria, frequency, urgency, nocturia, hesitancy, discharge, hematuria, flank pain Breast: Breast lumps, nipple discharge, bleeding.  Musculoskeletal: Denies arthralgia, myalgia, stiffness, Jt. Swelling, pain, limp, and strain/sprain. Denies falls. Skin: Denies puritis, rash, hives, warts, acne, eczema, changing in skin lesion Neuro: No weakness, tremor, incoordination, spasms, paresthesia, pain Psychiatric: Denies confusion, memory loss, sensory loss. Denies Depression. Endocrine: Denies change in weight, skin, hair change, nocturia, and paresthesia, diabetic polys, visual blurring, hyper / hypo glycemic episodes.  Heme/Lymph: No excessive bleeding, bruising, enlarged lymph nodes.  Physical Exam  BP 128/70 mmHg  Pulse 60  Temp(Src) 97.6 F (36.4 C)  Resp 16  Ht 5\' 1"  (1.549 m)  Wt 106 lb 9.6 oz (48.353 kg)  BMI 20.15 kg/m2  General Appearance: Well nourished and in no apparent distress.  Eyes: PERRLA, EOMs, conjunctiva no swelling or erythema, normal fundi and  vessels. Sinuses: No frontal/maxillary tenderness ENT/Mouth: EACs patent / TMs  nl. Nares clear without erythema, swelling, mucoid exudates. Oral hygiene is good. No erythema, swelling, or exudate. Tongue normal, non-obstructing. Tonsils not swollen or erythematous. Hearing normal.  Neck: Supple, thyroid normal. No bruits, nodes or JVD. Respiratory: Respiratory effort normal.  BS equal and clear bilateral without rales, rhonci, wheezing or stridor. Cardio: Heart sounds are normal with regular rate and rhythm and no murmurs, rubs or gallops. Peripheral pulses are normal and equal bilaterally without edema. No aortic or femoral bruits. Chest: symmetric with normal excursions and percussion. Breasts: Symmetric, without lumps, nipple discharge, retractions, or fibrocystic changes.  Abdomen: Flat, soft with bowel sounds active. Nontender, no guarding, rebound, hernias, masses, or organomegaly.  Lymphatics: Non tender without lymphadenopathy.  Musculoskeletal: Full ROM all peripheral extremities, joint stability, 5/5 strength, and normal gait. Skin: Warm and dry without rashes, lesions, cyanosis,  clubbing or  ecchymosis.  Neuro: Cranial nerves intact, reflexes equal bilaterally. Normal muscle tone, no cerebellar symptoms. Sensation intact.  Pysch: Alert and oriented X 3, normal affect, Insight and Judgment appropriate.   Assessment and Plan  1. Annual Preventative Screening Examination  - Microalbumin / creatinine urine ratio - EKG 12-Lead - [place], RETROPERITNL ABD,  LTD - POC Hemoccult Bld/Stl  - Urinalysis, Routine w reflex microscopic  - CBC with Differential/Platelet - BASIC METABOLIC PANEL WITH GFR - Hepatic function panel - Magnesium - Lipid panel - TSH - Hemoglobin A1c - Insulin, random - VITAMIN D 25 Hydroxy   2. Essential hypertension  - Microalbumin / creatinine urine ratio - EKG 12-Lead - [place], RETROPERITNL ABD,  LTD - TSH  3. Hyperlipidemia  - Lipid panel - TSH  4.  Prediabetes  - Hemoglobin A1c - Insulin, random  5. Vitamin D deficiency  - VITAMIN D 25 Hydroxy   6. Hypothyroidism   7. Gastroesophageal reflux disease   8. Screening for rectal cancer  - POC Hemoccult Bld/Stl  9. Medication management  - Urinalysis, Routine w reflex microscopic  - CBC with Differential/Platelet - BASIC METABOLIC PANEL WITH GFR - Hepatic function panel - Magnesium  10. Fibrocystic breast changes of both breasts  - MM Digital Screening; Future   Continue prudent diet as discussed, weight control, BP monitoring, regular exercise, and medications. Discussed med's effects and SE's. Screening labs and tests as requested with regular follow-up as recommended. Over 40 minutes of exam, counseling, chart review and high complex critical decision making was performed.

## 2015-06-25 ENCOUNTER — Other Ambulatory Visit: Payer: Self-pay | Admitting: Internal Medicine

## 2015-06-25 DIAGNOSIS — N289 Disorder of kidney and ureter, unspecified: Secondary | ICD-10-CM

## 2015-06-25 DIAGNOSIS — Z79899 Other long term (current) drug therapy: Secondary | ICD-10-CM

## 2015-06-25 LAB — URINALYSIS, ROUTINE W REFLEX MICROSCOPIC
Bilirubin Urine: NEGATIVE
GLUCOSE, UA: NEGATIVE
Hgb urine dipstick: NEGATIVE
KETONES UR: NEGATIVE
LEUKOCYTES UA: NEGATIVE
NITRITE: NEGATIVE
PH: 6 (ref 5.0–8.0)
Protein, ur: NEGATIVE
SPECIFIC GRAVITY, URINE: 1.008 (ref 1.001–1.035)

## 2015-06-25 LAB — HEPATIC FUNCTION PANEL
ALT: 18 U/L (ref 6–29)
AST: 29 U/L (ref 10–35)
Albumin: 4.3 g/dL (ref 3.6–5.1)
Alkaline Phosphatase: 44 U/L (ref 33–130)
BILIRUBIN DIRECT: 0.1 mg/dL (ref <=0.2)
BILIRUBIN INDIRECT: 0.4 mg/dL (ref 0.2–1.2)
TOTAL PROTEIN: 6.7 g/dL (ref 6.1–8.1)
Total Bilirubin: 0.5 mg/dL (ref 0.2–1.2)

## 2015-06-25 LAB — BASIC METABOLIC PANEL WITH GFR
BUN: 12 mg/dL (ref 7–25)
CALCIUM: 9.8 mg/dL (ref 8.6–10.4)
CO2: 28 mmol/L (ref 20–31)
Chloride: 102 mmol/L (ref 98–110)
Creat: 1.49 mg/dL — ABNORMAL HIGH (ref 0.60–0.93)
GFR, EST AFRICAN AMERICAN: 40 mL/min — AB (ref >=60)
GFR, EST NON AFRICAN AMERICAN: 35 mL/min — AB (ref >=60)
Glucose, Bld: 94 mg/dL (ref 65–99)
Potassium: 4.9 mmol/L (ref 3.5–5.3)
SODIUM: 143 mmol/L (ref 135–146)

## 2015-06-25 LAB — LIPID PANEL
CHOLESTEROL: 156 mg/dL (ref 125–200)
HDL: 68 mg/dL (ref >=46)
LDL Cholesterol: 59 mg/dL (ref <130)
TRIGLYCERIDES: 146 mg/dL (ref <150)
Total CHOL/HDL Ratio: 2.3 Ratio (ref <=5.0)
VLDL: 29 mg/dL (ref <30)

## 2015-06-25 LAB — MICROALBUMIN / CREATININE URINE RATIO
CREATININE, URINE: 77 mg/dL (ref 20–320)
MICROALB UR: 0.3 mg/dL
Microalb Creat Ratio: 4 mcg/mg creat (ref <30)

## 2015-06-25 LAB — VITAMIN D 25 HYDROXY (VIT D DEFICIENCY, FRACTURES): VIT D 25 HYDROXY: 92 ng/mL (ref 30–100)

## 2015-06-25 LAB — MAGNESIUM: Magnesium: 2.2 mg/dL (ref 1.5–2.5)

## 2015-06-25 LAB — INSULIN, RANDOM: Insulin: 6.1 u[IU]/mL (ref 2.0–19.6)

## 2015-06-27 ENCOUNTER — Other Ambulatory Visit: Payer: Self-pay | Admitting: Internal Medicine

## 2015-06-27 DIAGNOSIS — Z1231 Encounter for screening mammogram for malignant neoplasm of breast: Secondary | ICD-10-CM

## 2015-07-10 ENCOUNTER — Other Ambulatory Visit: Payer: Self-pay | Admitting: Physician Assistant

## 2015-07-10 ENCOUNTER — Other Ambulatory Visit: Payer: Self-pay | Admitting: Internal Medicine

## 2015-07-11 ENCOUNTER — Other Ambulatory Visit: Payer: Self-pay | Admitting: *Deleted

## 2015-07-11 ENCOUNTER — Ambulatory Visit
Admission: RE | Admit: 2015-07-11 | Discharge: 2015-07-11 | Disposition: A | Discharge status: Home / Self Care | Payer: Commercial Managed Care - HMO | Source: Ambulatory Visit | Attending: Internal Medicine | Admitting: Internal Medicine

## 2015-07-11 DIAGNOSIS — Z1231 Encounter for screening mammogram for malignant neoplasm of breast: Secondary | ICD-10-CM

## 2015-07-11 DIAGNOSIS — Z1212 Encounter for screening for malignant neoplasm of rectum: Secondary | ICD-10-CM

## 2015-07-11 DIAGNOSIS — Z0001 Encounter for general adult medical examination with abnormal findings: Secondary | ICD-10-CM

## 2015-07-11 LAB — POC HEMOCCULT BLD/STL (HOME/3-CARD/SCREEN)
Card #3 Fecal Occult Blood, POC: NEGATIVE
FECAL OCCULT BLD: NEGATIVE
FECAL OCCULT BLD: NEGATIVE

## 2015-07-25 ENCOUNTER — Ambulatory Visit: Payer: Self-pay

## 2015-08-08 ENCOUNTER — Ambulatory Visit (INDEPENDENT_AMBULATORY_CARE_PROVIDER_SITE_OTHER): Payer: Commercial Managed Care - HMO | Admitting: Internal Medicine

## 2015-08-08 ENCOUNTER — Encounter: Payer: Self-pay | Admitting: Internal Medicine

## 2015-08-08 VITALS — BP 138/60 | HR 81 | Temp 97.1°F | Resp 14 | Ht 61.0 in | Wt 109.5 lb

## 2015-08-08 DIAGNOSIS — M5136 Other intervertebral disc degeneration, lumbar region: Secondary | ICD-10-CM | POA: Diagnosis not present

## 2015-08-08 DIAGNOSIS — M542 Cervicalgia: Secondary | ICD-10-CM

## 2015-08-08 DIAGNOSIS — K219 Gastro-esophageal reflux disease without esophagitis: Secondary | ICD-10-CM

## 2015-08-08 DIAGNOSIS — E559 Vitamin D deficiency, unspecified: Secondary | ICD-10-CM

## 2015-08-08 DIAGNOSIS — I10 Essential (primary) hypertension: Secondary | ICD-10-CM

## 2015-08-08 DIAGNOSIS — E039 Hypothyroidism, unspecified: Secondary | ICD-10-CM

## 2015-08-08 DIAGNOSIS — Z0001 Encounter for general adult medical examination with abnormal findings: Secondary | ICD-10-CM | POA: Diagnosis not present

## 2015-08-08 DIAGNOSIS — E785 Hyperlipidemia, unspecified: Secondary | ICD-10-CM | POA: Diagnosis not present

## 2015-08-08 DIAGNOSIS — Z79899 Other long term (current) drug therapy: Secondary | ICD-10-CM | POA: Diagnosis not present

## 2015-08-08 DIAGNOSIS — R7303 Prediabetes: Secondary | ICD-10-CM

## 2015-08-08 DIAGNOSIS — Z23 Encounter for immunization: Secondary | ICD-10-CM | POA: Diagnosis not present

## 2015-08-08 DIAGNOSIS — G4700 Insomnia, unspecified: Secondary | ICD-10-CM

## 2015-08-08 DIAGNOSIS — Z0000 Encounter for general adult medical examination without abnormal findings: Secondary | ICD-10-CM

## 2015-08-08 DIAGNOSIS — R6889 Other general symptoms and signs: Secondary | ICD-10-CM | POA: Diagnosis not present

## 2015-08-08 NOTE — Progress Notes (Signed)
Patient ID: Stacy Wallace, female   DOB: Dec 23, 1945, 70 y.o.   MRN: [id number]  MEDICARE ANNUAL WELLNESS VISIT AND FOLLOW UP  Assessment:    1. Neck pain -non-compliant with treatment of seeing ortho and PT - MR Cervical Spine Wo Contrast; Future  2. Need for prophylactic vaccination against Streptococcus pneumoniae (pneumococcus)  - Pneumococcal conjugate vaccine 13-valent  3. Essential hypertension -well controlled -cont meds -dash diet -monitor at home  4. Gastroesophageal reflux disease, esophagitis presence not specified -cont zantac  5. Hypothyroidism, unspecified hypothyroidism type -cont levothyroxine  6. DDD (degenerative disc disease), lumbar -followed by Dr. Mina Marble  7. Hyperlipidemia -cont crestor -at goal  8. Prediabetes -cont diet and exercise  9. Vitamin D deficiency -cont supplement  10. Medication management -cont quarterly labs  11. Insomnia -xanax prn   Over 30 minutes of exam, counseling, chart review, and critical decision making was performed  Future Appointments Date Time Provider [place]  10/08/2015 2:30 PM Starlyn Skeans, PA-C [facility] None  01/09/2016 2:30 PM Unk Pinto, MD [facility] None  08/05/2016 3:00 PM Unk Pinto, MD [facility] None    Plan:   During the course of the visit the patient was educated and counseled about appropriate screening and preventive services including:    Pneumococcal vaccine   Influenza vaccine  Td vaccine  Prevnar 13  Screening electrocardiogram  Screening mammography  Bone densitometry screening  Colorectal cancer screening  Diabetes screening  Glaucoma screening  Nutrition counseling   Advanced directives: given info/requested copies   Subjective:   Stacy Wallace is a 70 y.o. female who presents for Medicare Annual Wellness Visit and 3 month follow up on hypertension, prediabetes, hyperlipidemia, vitamin D def.   Her blood pressure has been controlled  at home, today their BP is BP: 138/60 She does not workout. She denies chest pain, shortness of breath, dizziness.  She is on cholesterol medication and denies myalgias. Her cholesterol is at goal. The cholesterol last visit was:  Lab Results  Component Value Date   CHOL 156 06/24/2015   HDL 68 06/24/2015   LDLCALC 59 06/24/2015   TRIG 146 06/24/2015   CHOLHDL 2.3 06/24/2015   Patient reports that she has not been monitoring her diet.  She has a history of prediabetes which is diet controlled.  She has not symptoms of polydipsia, polyphagia, or polyuria.   Lab Results  Component Value Date   HGBA1C 5.9 (H) 06/24/2015    Last GFR Lab Results  Component Value Date   GFRNONAA 35 (L) 06/24/2015   Lab Results  Component Value Date   GFRAA 40 (L) 06/24/2015   Patient is on Vitamin D supplement. Lab Results  Component Value Date   VD25OH 92 06/24/2015      She reports that the left side of her neck is bothersome to her.  She reports that it has been bothering her for several months.  She has had PT and an appointment to see ortho which she never went to.  Her neck pain has not changed recently.  She still takes tizanidine for her neck but doesn't take it regularly.  She reports that this does help with the pain.       Medication Review Current Outpatient Prescriptions on File Prior to Visit  Medication Sig Dispense Refill  . ALPRAZolam (XANAX) 1 MG tablet TAKE 1 TABLET AT BEDTIME 90 tablet 1  . atenolol (TENORMIN) 100 MG tablet Take 1 tablet (100 mg total) by mouth daily. 90 tablet  4  . benazepril (LOTENSIN) 20 MG tablet TAKE 1 TABLET EVERY DAY 90 tablet 1  . Cholecalciferol (D 5000) 5000 UNITS capsule Take 5,000 Units by mouth daily.    Marland Kitchen estradiol (ESTRACE) 1 MG tablet TAKE 1 TABLET EVERY DAY 90 tablet 4  . furosemide (LASIX) 80 MG tablet TAKE 1 TABLET EVERY DAY (Patient taking differently: TAKE 40 MG BY MOUTH  EVERY DAY) 90 tablet 4  . levothyroxine (SYNTHROID, LEVOTHROID) 50  MCG tablet TAKE 1 TABLET EVERY DAY BEFORE BREAKFAST 90 tablet 1  . Magnesium 500 MG TABS Take 500 mg by mouth daily.    Marland Kitchen omeprazole (PRILOSEC) 20 MG capsule TAKE 1 CAPSULE (20 MG TOTAL) BY MOUTH DAILY. 90 capsule 2  . rosuvastatin (CRESTOR) 40 MG tablet TAKE 1/2 TO 1 TABLET DAILY FOR CHOLESTEROL AS DIRECTED (Patient taking differently: Take 20-40 mg by mouth daily. ) 90 tablet 1  . tiZANidine (ZANAFLEX) 4 MG tablet TAKE 1 TABLET EVERY 8 HOURS AS NEEDED FOR MUSCLE SPASMS 270 tablet 1  . traMADol (ULTRAM) 50 MG tablet Take 2 tablets (100 mg total) by mouth every 6 (six) hours as needed. 120 tablet 0   No current facility-administered medications on file prior to visit.     Allergies: Allergies  Allergen Reactions  . Lopid [Gemfibrozil]     headache  . Morphine And Related Other (See Comments)    Reaction: unknown   . Nickel Other (See Comments)    Reaction: unknown   . Latex Rash    Current Problems (verified) has Hyperlipidemia; Hypertension; Hypothyroidism; Prediabetes; Vitamin D deficiency; GERD (gastroesophageal reflux disease); Medication management; Insomnia; and DDD (degenerative disc disease), lumbar on her problem list.  Screening Tests Immunization History  Administered Date(s) Administered  . Influenza, High Dose Seasonal PF 09/19/2013, 10/16/2014  . Influenza-Unspecified 09/15/2012  . Pneumococcal-Unspecified 03/18/2010  . Td 11/09/2006    Preventative care: Last colonoscopy: 2008 Last mammogram: 07/11/15 DEXA: 2013  Prior vaccinations: TD or Tdap: 2008  Influenza: 2016 Pneumococcal: 2012 Prevnar13: Due today Shingles/Zostavax: Declined   Names of Other Physician/Practitioners you currently use: 1. [facility]- here for primary care 2. Dr. Katy Fitch, eye doctor, last visit Due to visit in 2 weeks.   3. Does not see, dentist, last visit remote Patient Care Team: Unk Pinto, MD as PCP - General (Internal  Medicine)  Surgical: She  has a past surgical history that includes Tubal ligation; Eye surgery; Abdominal hysterectomy; Carpal tunnel release (Bilateral, 2001); and Nasal septum surgery. Family Her family history includes Alcohol abuse in her father; Depression in her sister; Heart disease in her brother, father, and sister; Hyperlipidemia in her father and mother; Hypertension in her father and mother; Stroke in her sister. Social history  She reports that she quit smoking about 27 years ago. She does not have any smokeless tobacco history on file. She reports that she drinks alcohol. Her drug history is not on file.  MEDICARE WELLNESS OBJECTIVES: Physical activity:   Cardiac risk factors:   Depression/mood screen:   Depression screen [facility] 2/9 06/24/2015  Decreased Interest 0  Down, Depressed, Hopeless 0  PHQ - 2 Score 0    ADLs:  In your present state of health, do you have any difficulty performing the following activities: 06/24/2015 12/17/2014  Hearing? N N  Vision? N N  Difficulty concentrating or making decisions? N N  Walking or climbing stairs? N N  Dressing or bathing? N N  Doing errands, shopping? N N  Some recent data might be hidden     Cognitive Testing  Alert? Yes  Normal Appearance?Yes  Oriented to person? Yes  Place? Yes   Time? Yes  Recall of three objects?  Yes  Can perform simple calculations? Yes  Displays appropriate judgment?Yes  Can read the correct time from a watch face?Yes  EOL planning:     Objective:   Today's Vitals   08/08/15 1416  BP: 138/60  Pulse: 81  Resp: 14  Temp: 97.1 F (36.2 C)  TempSrc: Temporal  SpO2: 98%  Weight: 109 lb 8 oz (49.7 kg)  Height: 5\' 1"  (1.549 m)  PainSc: 6   PainLoc: Neck   Body mass index is 20.69 kg/m.  General appearance: alert, no distress, WD/WN,  female HEENT: normocephalic, sclerae anicteric, TMs pearly, nares patent, no discharge or erythema, pharynx normal Oral cavity: MMM, no lesions Neck:  supple, no lymphadenopathy, no thyromegaly, no masses Heart: RRR, normal S1, S2, no murmurs Lungs: CTA bilaterally, no wheezes, rhonchi, or rales Abdomen: +bs, soft, non tender, non distended, no masses, no hepatomegaly, no splenomegaly Musculoskeletal: nontender, no swelling, no obvious deformity Extremities: no edema, no cyanosis, no clubbing Pulses: 2+ symmetric, upper and lower extremities, normal cap refill Neurological: alert, oriented x 3, CN2-12 intact, strength normal upper extremities and lower extremities, sensation normal throughout, DTRs 2+ throughout, no cerebellar signs, gait normal Psychiatric: normal affect, behavior normal, pleasant  Breast: defer Gyn: defer Rectal: defer   Medicare Attestation I have personally reviewed: The patient's medical and social history Their use of alcohol, tobacco or illicit drugs Their current medications and supplements The patient's functional ability including ADLs,fall risks, home safety risks, cognitive, and hearing and visual impairment Diet and physical activities Evidence for depression or mood disorders  The patient's weight, height, BMI, and visual acuity have been recorded in the chart.  I have made referrals, counseling, and provided education to the patient based on review of the above and I have provided the patient with a written personalized care plan for preventive services.     Starlyn Skeans, PA-C   08/08/2015

## 2015-08-19 ENCOUNTER — Inpatient Hospital Stay: Admission: RE | Admit: 2015-08-19 | Payer: Commercial Managed Care - HMO | Source: Ambulatory Visit

## 2015-08-19 ENCOUNTER — Telehealth: Payer: Self-pay | Admitting: *Deleted

## 2015-08-19 NOTE — Telephone Encounter (Signed)
Patient called to inform us she cancelled her MRI spine d/t $200 copay.

## 2015-09-19 ENCOUNTER — Ambulatory Visit (HOSPITAL_COMMUNITY)
Admission: RE | Admit: 2015-09-19 | Discharge: 2015-09-19 | Disposition: A | Discharge status: Home / Self Care | Payer: Commercial Managed Care - HMO | Source: Ambulatory Visit | Attending: Internal Medicine | Admitting: Internal Medicine

## 2015-09-19 DIAGNOSIS — M5030 Other cervical disc degeneration, unspecified cervical region: Secondary | ICD-10-CM | POA: Insufficient documentation

## 2015-09-19 DIAGNOSIS — M5021 Other cervical disc displacement,  high cervical region: Secondary | ICD-10-CM | POA: Diagnosis not present

## 2015-09-19 DIAGNOSIS — M542 Cervicalgia: Secondary | ICD-10-CM

## 2015-09-19 DIAGNOSIS — M2578 Osteophyte, vertebrae: Secondary | ICD-10-CM | POA: Diagnosis not present

## 2015-09-19 DIAGNOSIS — M5020 Other cervical disc displacement, unspecified cervical region: Secondary | ICD-10-CM | POA: Insufficient documentation

## 2015-10-08 ENCOUNTER — Ambulatory Visit (INDEPENDENT_AMBULATORY_CARE_PROVIDER_SITE_OTHER): Payer: Commercial Managed Care - HMO | Admitting: Internal Medicine

## 2015-10-08 ENCOUNTER — Encounter: Payer: Self-pay | Admitting: Internal Medicine

## 2015-10-08 VITALS — BP 136/64 | HR 58 | Temp 98.0°F | Resp 16 | Ht 61.0 in | Wt 110.0 lb

## 2015-10-08 DIAGNOSIS — E782 Mixed hyperlipidemia: Secondary | ICD-10-CM | POA: Diagnosis not present

## 2015-10-08 DIAGNOSIS — E559 Vitamin D deficiency, unspecified: Secondary | ICD-10-CM

## 2015-10-08 DIAGNOSIS — R7303 Prediabetes: Secondary | ICD-10-CM | POA: Diagnosis not present

## 2015-10-08 DIAGNOSIS — Z23 Encounter for immunization: Secondary | ICD-10-CM

## 2015-10-08 DIAGNOSIS — M5030 Other cervical disc degeneration, unspecified cervical region: Secondary | ICD-10-CM

## 2015-10-08 DIAGNOSIS — I10 Essential (primary) hypertension: Secondary | ICD-10-CM

## 2015-10-08 DIAGNOSIS — Z79899 Other long term (current) drug therapy: Secondary | ICD-10-CM | POA: Diagnosis not present

## 2015-10-08 DIAGNOSIS — E039 Hypothyroidism, unspecified: Secondary | ICD-10-CM

## 2015-10-08 LAB — CBC WITH DIFFERENTIAL/PLATELET
BASOS ABS: 0 {cells}/uL (ref 0–200)
Basophils Relative: 0 %
Eosinophils Absolute: 124 cells/uL (ref 15–500)
Eosinophils Relative: 2 %
HEMATOCRIT: 39.7 % (ref 35.0–45.0)
HEMOGLOBIN: 13 g/dL (ref 11.7–15.5)
LYMPHS ABS: 2790 {cells}/uL (ref 850–3900)
Lymphocytes Relative: 45 %
MCH: 30.2 pg (ref 27.0–33.0)
MCHC: 32.7 g/dL (ref 32.0–36.0)
MCV: 92.3 fL (ref 80.0–100.0)
MONO ABS: 372 {cells}/uL (ref 200–950)
MPV: 9.7 fL (ref 7.5–12.5)
Monocytes Relative: 6 %
Neutro Abs: 2914 cells/uL (ref 1500–7800)
Neutrophils Relative %: 47 %
Platelets: 137 10*3/uL — ABNORMAL LOW (ref 140–400)
RBC: 4.3 MIL/uL (ref 3.80–5.10)
RDW: 13.7 % (ref 11.0–15.0)
WBC: 6.2 10*3/uL (ref 3.8–10.8)

## 2015-10-08 LAB — HEPATIC FUNCTION PANEL
ALT: 16 U/L (ref 6–29)
AST: 29 U/L (ref 10–35)
Albumin: 4.3 g/dL (ref 3.6–5.1)
Alkaline Phosphatase: 49 U/L (ref 33–130)
BILIRUBIN INDIRECT: 0.4 mg/dL (ref 0.2–1.2)
Bilirubin, Direct: 0.1 mg/dL (ref <=0.2)
TOTAL PROTEIN: 6.9 g/dL (ref 6.1–8.1)
Total Bilirubin: 0.5 mg/dL (ref 0.2–1.2)

## 2015-10-08 LAB — BASIC METABOLIC PANEL WITH GFR
BUN: 15 mg/dL (ref 7–25)
CALCIUM: 9.7 mg/dL (ref 8.6–10.4)
CO2: 32 mmol/L — AB (ref 20–31)
CREATININE: 1.34 mg/dL — AB (ref 0.60–0.93)
Chloride: 102 mmol/L (ref 98–110)
GFR, Est African American: 46 mL/min — ABNORMAL LOW (ref >=60)
GFR, Est Non African American: 40 mL/min — ABNORMAL LOW (ref >=60)
GLUCOSE: 86 mg/dL (ref 65–99)
Potassium: 4.4 mmol/L (ref 3.5–5.3)
Sodium: 141 mmol/L (ref 135–146)

## 2015-10-08 LAB — TSH: TSH: 1.81 m[IU]/L

## 2015-10-08 MED ORDER — PILOCARPINE HCL 5 MG PO TABS
5.0000 mg | ORAL_TABLET | Freq: Two times a day (BID) | ORAL | 0 refills | Status: DC
Start: 2015-10-08 — End: 2016-04-30

## 2015-10-08 NOTE — Patient Instructions (Signed)
Pilocarpine tablets What is this medicine? PILOCARPINE (PYE loe KAR peen) can increase saliva in the mouth. This medicine is used to treat dry mouth from radiation treatment or from Sjogren's syndrome. This medicine may be used for other purposes; ask your health care provider or pharmacist if you have questions. What should I tell my health care provider before I take this medicine? They need to know if you have any of these conditions: -eye infection or other eye problems -glaucoma -heart disease -liver disease -lung or breathing disease, like asthma -an unusual or allergic reaction to pilocarpine, other medicines, foods, dyes, or preservatives -pregnant or trying to get pregnant -breast-feeding How should I use this medicine? Take this medicine by mouth with a glass of water. Follow the directions on the prescription label. Take your doses at regular intervals. Do not take your medicine more often than directed. Talk to your pediatrician regarding the use of this medicine in children. Special care may be needed. Overdosage: If you think you have taken too much of this medicine contact a poison control center or emergency room at once. NOTE: This medicine is only for you. Do not share this medicine with others. What if I miss a dose? If you miss a dose, take it as soon as you can. If it is almost time for your next dose, take only that dose. Do not take double or extra doses. What may interact with this medicine? -antihistamines for allergy, cough and cold -atropine -certain medicines for Alzheimer's disease like donepezil, galantamine, rivastigmine -certain medicines for bladder problems like bethanecol, oxybutynin, tolterodine -certain medicines for Parkinson's disease like benztropine, trihexyphenidyl -certain medicines for quitting smoking like nicotine -certain medicines for stomach problems like dicyclomine, hyoscyamine -certain medicines for travel sickness like  scopolamine -ipratropium -medicines for blood pressure or heart problems like metoprolol This list may not describe all possible interactions. Give your health care provider a list of all the medicines, herbs, non-prescription drugs, or dietary supplements you use. Also tell them if you smoke, drink alcohol, or use illegal drugs. Some items may interact with your medicine. What should I watch for while using this medicine? Visit your doctor for regular check ups. Tell your doctor if your symptoms do not get better or if they get worse. You may get blurry vision or have trouble telling how far something is from you. This may be a problem at night or when the lights are low. Do not drive, use machinery, or do anything that needs clear vision until you know how this medicine affects you. If you sweat a lot, drink enough to replace fluids. Do not get dehydrated. What side effects may I notice from receiving this medicine? Side effects that you should report to your doctor or health care professional as soon as possible: -allergic reactions like skin rash, itching or hives -breathing problems -confusion -irregular heartbeat -stomach pains -tremor -unusual blood pressure -unusually weak or tired -vomiting Side effects that usually do not require medical attention (report to your doctor or health care professional if they continue or are bothersome): -changes in vision -chills, flushing -diarrhea -headache -increased sweating -nausea -runny eyes, nose -urgent need to pass urine This list may not describe all possible side effects. Call your doctor for medical advice about side effects. You may report side effects to FDA at 1-800-FDA-1088. Where should I keep my medicine? Keep out of the reach of children. Store at room temperature between 15 and 30 degrees C (59 and 86 degrees F). Throw  away any unused medicine after the expiration date. NOTE: This sheet is a summary. It may not cover all  possible information. If you have questions about this medicine, talk to your doctor, pharmacist, or health care provider.    2016, Elsevier/Gold Standard. (2007-07-21 15:03:54)

## 2015-10-08 NOTE — Progress Notes (Signed)
Assessment and Plan:  Hypertension:  -Continue medication,  -monitor blood pressure at home.  -Continue DASH diet.   -Reminder to go to the ER if any CP, SOB, nausea, dizziness, severe HA, changes vision/speech, left arm numbness and tingling, and jaw pain.  Cholesterol: -Continue diet and exercise.  -Check cholesterol.   Pre-diabetes: -Continue diet and exercise.  -Check A1C  Vitamin D Def: -check level -continue medications.   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 Mina Marble for further injections or we will refer her to pain management. -she does not like the cymbalta or the gabapenting -she reports that "she is tired of pain".  Need for flu shot -given here today  Continue diet and meds as discussed. Further disposition pending results of labs.  HPI 70 y.o. female  presents for 3 month follow up with hypertension, hyperlipidemia, prediabetes and vitamin D.   Her blood pressure has been controlled at home, today their BP is BP: 136/64.   She does workout. She denies chest pain, shortness of breath, dizziness.   She is on cholesterol medication and denies myalgias. Her cholesterol is at goal. The cholesterol last visit was:   Lab Results  Component Value Date   CHOL 156 06/24/2015   HDL 68 06/24/2015   LDLCALC 59 06/24/2015   TRIG 146 06/24/2015   CHOLHDL 2.3 06/24/2015     She has been working on diet and exercise for prediabetes, and denies foot ulcerations, hyperglycemia, hypoglycemia , increased appetite, nausea, paresthesia of the feet, polydipsia, polyuria, visual disturbances, vomiting and weight loss. Last A1C in the office was:  Lab Results  Component Value Date   HGBA1C 5.9 (H) 06/24/2015    Patient is on Vitamin D supplement.  Lab Results  Component Value Date   VD25OH 92 06/24/2015     Patient reports that she is still having some pain in her left hip and also in her neck.  She did have an MRI with  some degenerative Disc disease and also some arthritis.    She has had her flu shot.     Current Medications:  Current Outpatient Prescriptions on File Prior to Visit  Medication Sig Dispense Refill  . ALPRAZolam (XANAX) 1 MG tablet TAKE 1 TABLET AT BEDTIME 90 tablet 1  . atenolol (TENORMIN) 100 MG tablet Take 1 tablet (100 mg total) by mouth daily. 90 tablet 4  . benazepril (LOTENSIN) 20 MG tablet TAKE 1 TABLET EVERY DAY 90 tablet 1  . Cholecalciferol (D 5000) 5000 UNITS capsule Take 5,000 Units by mouth daily.    Marland Kitchen estradiol (ESTRACE) 1 MG tablet TAKE 1 TABLET EVERY DAY 90 tablet 4  . furosemide (LASIX) 80 MG tablet TAKE 1 TABLET EVERY DAY (Patient taking differently: TAKE 40 MG BY MOUTH  EVERY DAY) 90 tablet 4  . levothyroxine (SYNTHROID, LEVOTHROID) 50 MCG tablet TAKE 1 TABLET EVERY DAY BEFORE BREAKFAST 90 tablet 1  . Magnesium 500 MG TABS Take 500 mg by mouth daily.    Marland Kitchen omeprazole (PRILOSEC) 20 MG capsule TAKE 1 CAPSULE (20 MG TOTAL) BY MOUTH DAILY. 90 capsule 2  . rosuvastatin (CRESTOR) 40 MG tablet TAKE 1/2 TO 1 TABLET DAILY FOR CHOLESTEROL AS DIRECTED (Patient taking differently: Take 20-40 mg by mouth daily. ) 90 tablet 1  . tiZANidine (ZANAFLEX) 4 MG tablet TAKE 1 TABLET EVERY 8 HOURS AS NEEDED FOR MUSCLE SPASMS 270 tablet 1   No current facility-administered medications on file prior to  visit.     Medical History:  Past Medical History:  Diagnosis Date  . GERD (gastroesophageal reflux disease)   . Hyperlipidemia   . Hypertension   . Other abnormal glucose   . Unspecified hypothyroidism   . Unspecified vitamin D deficiency     Allergies:  Allergies  Allergen Reactions  . Lopid [Gemfibrozil]     headache  . Morphine And Related Other (See Comments)    Reaction: unknown   . Nickel Other (See Comments)    Reaction: unknown   . Latex Rash     Review of Systems:  Review of Systems  Constitutional: Negative for chills, fever and malaise/fatigue.  HENT: Negative  for congestion, ear pain and sore throat.   Eyes: Negative.   Respiratory: Negative for cough, shortness of breath and wheezing.   Cardiovascular: Negative for chest pain, palpitations and leg swelling.  Gastrointestinal: Negative for abdominal pain, blood in stool, constipation, diarrhea, heartburn and melena.  Genitourinary: Negative.   Musculoskeletal: Positive for back pain, joint pain and neck pain.  Skin: Negative.   Neurological: Negative for dizziness, sensory change, loss of consciousness and headaches.  Psychiatric/Behavioral: Negative for depression. The patient is not nervous/anxious and does not have insomnia.     Family history- Review and unchanged  Social history- Review and unchanged  Physical Exam: BP 136/64   Pulse (!) 58   Temp 98 F (36.7 C) (Temporal)   Resp 16   Ht 5\' 1"  (1.549 m)   Wt 110 lb (49.9 kg)   BMI 20.78 kg/m  Wt Readings from Last 3 Encounters:  10/08/15 110 lb (49.9 kg)  08/08/15 109 lb 8 oz (49.7 kg)  06/24/15 106 lb 9.6 oz (48.4 kg)    General Appearance: Well nourished well developed, in no apparent distress. Eyes: PERRLA, EOMs, conjunctiva no swelling or erythema ENT/Mouth: Ear canals normal without obstruction, swelling, erythma, discharge.  TMs normal bilaterally.  Oropharynx moist, clear, without exudate, or postoropharyngeal swelling. Neck: Supple, thyroid normal,no cervical adenopathy  Respiratory: Respiratory effort normal, Breath sounds clear A&P without rhonchi, wheeze, or rale.  No retractions, no accessory usage. Cardio: RRR with no MRGs. Brisk peripheral pulses without edema.  Abdomen: Soft, + BS,  Non tender, no guarding, rebound, hernias, masses. Musculoskeletal: Full ROM, 5/5 strength, Normal gait Skin: Warm, dry without rashes, lesions, ecchymosis.  Neuro: Awake and oriented X 3, Cranial nerves intact. Normal muscle tone, no cerebellar symptoms. Psych: Normal affect, Insight and Judgment appropriate.    Starlyn Skeans, PA-C 2:58 PM [facility]

## 2015-10-14 ENCOUNTER — Other Ambulatory Visit: Payer: Self-pay | Admitting: Internal Medicine

## 2015-11-18 ENCOUNTER — Other Ambulatory Visit: Payer: Self-pay | Admitting: Internal Medicine

## 2016-01-01 ENCOUNTER — Other Ambulatory Visit: Payer: Self-pay | Admitting: Internal Medicine

## 2016-01-03 ENCOUNTER — Other Ambulatory Visit: Payer: Self-pay | Admitting: Internal Medicine

## 2016-01-07 ENCOUNTER — Other Ambulatory Visit: Payer: Self-pay | Admitting: *Deleted

## 2016-01-07 ENCOUNTER — Other Ambulatory Visit: Payer: Self-pay | Admitting: Internal Medicine

## 2016-01-07 MED ORDER — ALPRAZOLAM 1 MG PO TABS: 1.0000 mg | ORAL_TABLET | Freq: Every day | ORAL | 1 refills | Status: DC

## 2016-01-07 NOTE — Telephone Encounter (Signed)
Please call Alpraz  

## 2016-01-09 ENCOUNTER — Ambulatory Visit: Payer: Self-pay | Admitting: Internal Medicine

## 2016-01-27 ENCOUNTER — Other Ambulatory Visit: Payer: Self-pay | Admitting: *Deleted

## 2016-01-27 MED ORDER — MELOXICAM 15 MG PO TABS: ORAL_TABLET | ORAL | 1 refills | Status: DC

## 2016-01-28 ENCOUNTER — Ambulatory Visit (INDEPENDENT_AMBULATORY_CARE_PROVIDER_SITE_OTHER): Payer: Medicare HMO | Admitting: Internal Medicine

## 2016-01-28 ENCOUNTER — Encounter: Payer: Self-pay | Admitting: Internal Medicine

## 2016-01-28 VITALS — BP 128/70 | HR 60 | Temp 97.0°F | Resp 16 | Ht 61.0 in | Wt 115.2 lb

## 2016-01-28 DIAGNOSIS — I10 Essential (primary) hypertension: Secondary | ICD-10-CM | POA: Diagnosis not present

## 2016-01-28 DIAGNOSIS — R7303 Prediabetes: Secondary | ICD-10-CM

## 2016-01-28 DIAGNOSIS — E039 Hypothyroidism, unspecified: Secondary | ICD-10-CM

## 2016-01-28 DIAGNOSIS — Z79899 Other long term (current) drug therapy: Secondary | ICD-10-CM | POA: Diagnosis not present

## 2016-01-28 DIAGNOSIS — E782 Mixed hyperlipidemia: Secondary | ICD-10-CM | POA: Diagnosis not present

## 2016-01-28 DIAGNOSIS — E559 Vitamin D deficiency, unspecified: Secondary | ICD-10-CM

## 2016-01-28 DIAGNOSIS — K219 Gastro-esophageal reflux disease without esophagitis: Secondary | ICD-10-CM

## 2016-01-28 LAB — CBC WITH DIFFERENTIAL/PLATELET
Basophils Absolute: 0 cells/uL (ref 0–200)
Basophils Relative: 0 %
Eosinophils Absolute: 68 cells/uL (ref 15–500)
Eosinophils Relative: 1 %
HEMATOCRIT: 40.7 % (ref 35.0–45.0)
Hemoglobin: 13.4 g/dL (ref 11.7–15.5)
LYMPHS PCT: 42 %
Lymphs Abs: 2856 cells/uL (ref 850–3900)
MCH: 30.6 pg (ref 27.0–33.0)
MCHC: 32.9 g/dL (ref 32.0–36.0)
MCV: 92.9 fL (ref 80.0–100.0)
MONO ABS: 408 {cells}/uL (ref 200–950)
MPV: 10 fL (ref 7.5–12.5)
Monocytes Relative: 6 %
NEUTROS PCT: 51 %
Neutro Abs: 3468 cells/uL (ref 1500–7800)
Platelets: 165 10*3/uL (ref 140–400)
RBC: 4.38 MIL/uL (ref 3.80–5.10)
RDW: 13.3 % (ref 11.0–15.0)
WBC: 6.8 10*3/uL (ref 3.8–10.8)

## 2016-01-28 LAB — TSH: TSH: 7.52 mIU/L — ABNORMAL HIGH

## 2016-01-28 NOTE — Progress Notes (Signed)
[place] [facility] Unk Pinto, M.D.        Uvaldo Bristle. Silverio Lay, P.A.-C       Starlyn Skeans, P.A.-C  [facility]                [address] [place] [id number] Telephone [phone] Telefax [phone] ______________________________________________________________________     This very nice 71 y.o. DWF presents for 6 month follow up with Hypertension, Hyperlipidemia, Pre-Diabetes and Vitamin D Deficiency. Patient also reports her GERD is controlled on current meds and diet.      Patient is treated for HTN (1999) & BP has been controlled at home. Today's BP is at goal - 128/70. Patient has had no complaints of any cardiac type chest pain, palpitations, dyspnea/orthopnea/PND, dizziness, claudication, or dependent edema.     Hyperlipidemia is controlled with diet & meds. Patient denies myalgias or other med SE's. Last Lipids were  Lab Results  Component Value Date   CHOL 156 06/24/2015   HDL 68 06/24/2015   LDLCALC 59 06/24/2015   TRIG 146 06/24/2015   CHOLHDL 2.3 06/24/2015      Also, the patient has history of PreDiabetes and has had no symptoms of reactive hypoglycemia, diabetic polys, paresthesias or visual blurring.  Last A1c was  Lab Results  Component Value Date   HGBA1C 5.9 (H) 06/24/2015      Patient has been on Thyroid Replacement since 2014, and in the last few months she has decided that out of the 10+ meds that she's taking the her thyroid med is causing a"bad taste" in her mouth, so she only been taking it very sporadically about 2-3 x/week. Further, the patient also has history of Vitamin D Deficiency and supplements vitamin D without any suspected side-effects. Last vitamin D was  Lab Results  Component Value Date   VD25OH 27 06/24/2015   Current Outpatient Prescriptions on File Prior to Visit  Medication Sig  . ALPRAZolam (XANAX) 1 MG tablet TAKE 1 TABLET AT BEDTIME  .  atenolol (TENORMIN) 100 MG tablet TAKE 1 TABLET EVERY DAY  . benazepril (LOTENSIN) 20 MG tablet TAKE 1 TABLET EVERY DAY  . Cholecalciferol (D 5000) 5000 UNITS capsule Take 5,000 Units by mouth daily.  Marland Kitchen estradiol (ESTRACE) 1 MG tablet TAKE 1 TABLET EVERY DAY  . furosemide (LASIX) 80 MG tablet TAKE 1 TABLET EVERY DAY  . Magnesium 500 MG TABS Take 500 mg by mouth daily.  . meloxicam (MOBIC) 15 MG tablet TAKE 1 TABLET EVERY DAY AS NEEDED FOR PAIN  . omeprazole (PRILOSEC) 20 MG capsule TAKE 1 CAPSULE (20 MG TOTAL) BY MOUTH DAILY.  Marland Kitchen pilocarpine (SALAGEN) 5 MG tablet Take 1 tablet (5 mg total) by mouth 2 (two) times daily.  . rosuvastatin (CRESTOR) 40 MG tablet TAKE 1/2 TO 1 TABLET DAILY FOR CHOLESTEROL AS DIRECTED (Patient taking differently: Take 20-40 mg by mouth daily. )  . tiZANidine (ZANAFLEX) 4 MG tablet TAKE 1 TABLET EVERY 8 HOURS AS NEEDED FOR MUSCLE SPASMS  . levothyroxine (SYNTHROID, LEVOTHROID) 50 MCG tablet TAKE 1 TABLET EVERY DAY BEFORE BREAKFAST (Patient not taking: Reported on 01/28/2016)   No current facility-administered medications on file prior to visit.    Allergies  Allergen Reactions  . Lopid [Gemfibrozil]     headache  . Morphine And Related Other (See Comments)    Reaction: unknown   . Nickel  Other (See Comments)    Reaction: unknown   . Latex Rash   PMHx:   Past Medical History:  Diagnosis Date  . GERD (gastroesophageal reflux disease)   . Hyperlipidemia   . Hypertension   . Other abnormal glucose   . Unspecified hypothyroidism   . Unspecified vitamin D deficiency    Immunization History  Administered Date(s) Administered  . Influenza, High Dose Seasonal PF 09/19/2013, 10/16/2014, 10/08/2015  . Influenza-Unspecified 09/15/2012  . Pneumococcal Conjugate-13 08/08/2015  . Pneumococcal-Unspecified 03/18/2010  . Td 11/09/2006   Past Surgical History:  Procedure Laterality Date  . ABDOMINAL HYSTERECTOMY    . CARPAL TUNNEL RELEASE Bilateral 2001  . EYE  SURGERY    . NASAL SEPTUM SURGERY    . TUBAL LIGATION     FHx:    Reviewed / unchanged  SHx:    Reviewed / unchanged  Systems Review:  Constitutional: Denies fever, chills, wt changes, headaches, insomnia, fatigue, night sweats, change in appetite. Eyes: Denies redness, blurred vision, diplopia, discharge, itchy, watery eyes.  ENT: Denies discharge, congestion, post nasal drip, epistaxis, sore throat, earache, hearing loss, dental pain, tinnitus, vertigo, sinus pain, snoring.  CV: Denies chest pain, palpitations, irregular heartbeat, syncope, dyspnea, diaphoresis, orthopnea, PND, claudication or edema. Respiratory: denies cough, dyspnea, DOE, pleurisy, hoarseness, laryngitis, wheezing.  Gastrointestinal: Denies dysphagia, odynophagia, heartburn, reflux, water brash, abdominal pain or cramps, nausea, vomiting, bloating, diarrhea, constipation, hematemesis, melena, hematochezia  or hemorrhoids. Genitourinary: Denies dysuria, frequency, urgency, nocturia, hesitancy, discharge, hematuria or flank pain. Musculoskeletal: Denies arthralgias, myalgias, stiffness, jt. swelling, pain, limping or strain/sprain.  Skin: Denies pruritus, rash, hives, warts, acne, eczema or change in skin lesion(s). Neuro: No weakness, tremor, incoordination, spasms, paresthesia or pain. Psychiatric: Denies confusion, memory loss or sensory loss. Endo: Denies change in weight, skin or hair change.  Heme/Lymph: No excessive bleeding, bruising or enlarged lymph nodes.  Physical Exam  BP 128/70   Pulse 60   Temp 97 F (36.1 C)   Resp 16   Ht 5\' 1"  (1.549 m)   Wt 115 lb 3.2 oz (52.3 kg)   BMI 21.77 kg/m   Appears well nourished and in no distress.  Eyes: PERRLA, EOMs, conjunctiva no swelling or erythema. Sinuses: No frontal/maxillary tenderness ENT/Mouth: EAC's clear, TM's nl w/o erythema, bulging. Nares clear w/o erythema, swelling, exudates. Oropharynx clear without erythema or exudates. Oral hygiene is good.  Tongue normal, non obstructing. Hearing intact.  Neck: Supple. Thyroid nl. Car 2+/2+ without bruits, nodes or JVD. Chest: Respirations nl with BS clear & equal w/o rales, rhonchi, wheezing or stridor.  Cor: Heart sounds normal w/ regular rate and rhythm without sig. murmurs, gallops, clicks, or rubs. Peripheral pulses normal and equal  without edema.  Abdomen: Soft & bowel sounds normal. Non-tender w/o guarding, rebound, hernias, masses, or organomegaly.  Lymphatics: Unremarkable.  Musculoskeletal: Full ROM all peripheral extremities, joint stability, 5/5 strength, and normal gait.  Skin: Warm, dry without exposed rashes, lesions or ecchymosis apparent.  Neuro: Cranial nerves intact, reflexes equal bilaterally. Sensory-motor testing grossly intact. Tendon reflexes grossly intact.  Pysch: Alert & oriented x 3.  Insight and judgement nl & appropriate. No ideations.  Assessment and Plan:   1. Essential hypertension  - Continue medication, monitor blood pressure at home.  - Continue DASH diet. Reminder to go to the ER if any CP,  SOB, nausea, dizziness, severe HA, changes vision/speech,  left arm numbness and tingling and jaw pain. - CBC with Differential/Platelet - BASIC METABOLIC PANEL  WITH GFR - TSH  2. Mixed hyperlipidemia  - Continue diet/meds, exercise,& lifestyle modifications.  - Continue monitor periodic cholesterol/liver & renal functions  - Hepatic function panel - Lipid panel - TSH  3. Prediabetes  - Continue diet, exercise, lifestyle modifications.  - Monitor appropriate labs. - Hemoglobin A1c - Insulin, random  4. Vitamin D deficiency  - Continue supplementation. - VITAMIN D 25 Hydroxy    5. Hypothyroidism  - TSH  6. Gastroesophageal reflux disease   7. Medication management  - CBC with Differential/Platelet - BASIC METABOLIC PANEL WITH GFR - Hepatic function panel - Magnesium - TSH - VITAMIN D 25 Hydroxy        Recommended regular exercise, BP  monitoring, weight control, and discussed med and SE's. Recommended labs to assess and monitor clinical status. Further disposition pending results of labs. Over 30 minutes of exam, counseling, chart review was performed

## 2016-01-28 NOTE — Patient Instructions (Signed)

## 2016-01-29 LAB — LIPID PANEL
CHOLESTEROL: 196 mg/dL (ref <200)
HDL: 75 mg/dL (ref >50)
LDL Cholesterol: 91 mg/dL (ref <100)
TRIGLYCERIDES: 150 mg/dL — AB (ref <150)
Total CHOL/HDL Ratio: 2.6 Ratio (ref <5.0)
VLDL: 30 mg/dL (ref <30)

## 2016-01-29 LAB — HEPATIC FUNCTION PANEL
ALT: 11 U/L (ref 6–29)
AST: 27 U/L (ref 10–35)
Albumin: 4.4 g/dL (ref 3.6–5.1)
Alkaline Phosphatase: 45 U/L (ref 33–130)
Bilirubin, Direct: 0.1 mg/dL (ref <=0.2)
Indirect Bilirubin: 0.4 mg/dL (ref 0.2–1.2)
TOTAL PROTEIN: 7.2 g/dL (ref 6.1–8.1)
Total Bilirubin: 0.5 mg/dL (ref 0.2–1.2)

## 2016-01-29 LAB — MAGNESIUM: Magnesium: 2.2 mg/dL (ref 1.5–2.5)

## 2016-01-29 LAB — BASIC METABOLIC PANEL WITH GFR
BUN: 22 mg/dL (ref 7–25)
CALCIUM: 9.8 mg/dL (ref 8.6–10.4)
CO2: 24 mmol/L (ref 20–31)
Chloride: 104 mmol/L (ref 98–110)
Creat: 1.33 mg/dL — ABNORMAL HIGH (ref 0.60–0.93)
GFR, EST AFRICAN AMERICAN: 46 mL/min — AB (ref >=60)
GFR, Est Non African American: 40 mL/min — ABNORMAL LOW (ref >=60)
GLUCOSE: 95 mg/dL (ref 65–99)
Potassium: 4.1 mmol/L (ref 3.5–5.3)
Sodium: 147 mmol/L — ABNORMAL HIGH (ref 135–146)

## 2016-01-29 LAB — VITAMIN D 25 HYDROXY (VIT D DEFICIENCY, FRACTURES): Vit D, 25-Hydroxy: 93 ng/mL (ref 30–100)

## 2016-01-29 LAB — HEMOGLOBIN A1C
Hgb A1c MFr Bld: 5.3 % (ref <5.7)
MEAN PLASMA GLUCOSE: 105 mg/dL

## 2016-01-29 LAB — INSULIN, RANDOM: INSULIN: 5.5 u[IU]/mL (ref 2.0–19.6)

## 2016-01-30 ENCOUNTER — Other Ambulatory Visit: Payer: Self-pay | Admitting: *Deleted

## 2016-01-30 MED ORDER — MELOXICAM 15 MG PO TABS: ORAL_TABLET | ORAL | 1 refills | Status: DC

## 2016-03-09 ENCOUNTER — Other Ambulatory Visit: Payer: Self-pay | Admitting: Internal Medicine

## 2016-03-16 ENCOUNTER — Other Ambulatory Visit: Payer: Self-pay | Admitting: Physician Assistant

## 2016-04-29 NOTE — Progress Notes (Signed)
Assessment and Plan:  Hypertension:  -Continue medication,  -monitor blood pressure at home.  -Continue DASH diet.   -Reminder to go to the ER if any CP, SOB, nausea, dizziness, severe HA, changes vision/speech, left arm numbness and tingling, and jaw pain.  Cholesterol: -Continue diet and exercise.  -Check cholesterol.   Pre-diabetes: -Continue diet and exercise.  -Check A1C  Vitamin D Def: -check level -continue medications.   Glossitis Check labs, declines meds at this time, avoid spicy foods  Continue diet and meds as discussed. Further disposition pending results of labs. Future Appointments Date Time Provider [place]  08/05/2016 3:00 PM Unk Pinto, MD [facility] None    HPI 71 y.o. female  presents for 3 month follow up with hypertension, hyperlipidemia, prediabetes and vitamin D.   Her blood pressure has been controlled at home, today their BP is BP: 132/74.   She does workout. She denies chest pain, shortness of breath, dizziness.   She is on cholesterol medication and denies myalgias. Her cholesterol is at goal. The cholesterol last visit was:   Lab Results  Component Value Date   CHOL 196 01/28/2016   HDL 75 01/28/2016   LDLCALC 91 01/28/2016   TRIG 150 (H) 01/28/2016   CHOLHDL 2.6 01/28/2016    She has been working on diet and exercise for prediabetes, and denies foot ulcerations, hyperglycemia, hypoglycemia , increased appetite, nausea, paresthesia of the feet, polydipsia, polyuria, visual disturbances, vomiting and weight loss. Last A1C in the office was:  Lab Results  Component Value Date   HGBA1C 5.3 01/28/2016   Patient is on Vitamin D supplement.  Lab Results  Component Value Date   VD25OH 93 01/28/2016     Following with Dr. Mina Marble for neck and back pain.  Has had decreased taste and loss of bumps on tongue, has not burned it, no pain x several months   Current Medications:  Current Outpatient Prescriptions on File Prior to Visit   Medication Sig Dispense Refill  . ALPRAZolam (XANAX) 1 MG tablet TAKE 1 TABLET AT BEDTIME 90 tablet 1  . atenolol (TENORMIN) 100 MG tablet TAKE 1 TABLET EVERY DAY 90 tablet 1  . benazepril (LOTENSIN) 20 MG tablet TAKE 1 TABLET EVERY DAY 90 tablet 1  . Cholecalciferol (D 5000) 5000 UNITS capsule Take 5,000 Units by mouth daily.    Marland Kitchen estradiol (ESTRACE) 1 MG tablet TAKE 1 TABLET EVERY DAY 90 tablet 4  . furosemide (LASIX) 80 MG tablet TAKE 1 TABLET EVERY DAY 90 tablet 4  . levothyroxine (SYNTHROID, LEVOTHROID) 50 MCG tablet TAKE 1 TABLET EVERY DAY BEFORE BREAKFAST 90 tablet 1  . Magnesium 500 MG TABS Take 500 mg by mouth daily.    . meloxicam (MOBIC) 15 MG tablet TAKE 1 TABLET EVERY DAY AS NEEDED FOR PAIN 90 tablet 1  . omeprazole (PRILOSEC) 20 MG capsule TAKE 1 CAPSULE EVERY DAY 90 capsule 2  . rosuvastatin (CRESTOR) 40 MG tablet TAKE 1 TABLET DAILY FOR CHOLESTEROL OR AS DIRECTED 90 tablet 1  . tiZANidine (ZANAFLEX) 4 MG tablet TAKE 1 TABLET EVERY 8 HOURS AS NEEDED FOR MUSCLE SPASMS 270 tablet 1   No current facility-administered medications on file prior to visit.     Medical History:  Past Medical History:  Diagnosis Date  . GERD (gastroesophageal reflux disease)   . Hyperlipidemia   . Hypertension   . Other abnormal glucose   . Unspecified hypothyroidism   . Unspecified vitamin D deficiency     Allergies:  Allergies  Allergen Reactions  . Lopid [Gemfibrozil]     headache  . Morphine And Related Other (See Comments)    Reaction: unknown   . Nickel Other (See Comments)    Reaction: unknown   . Latex Rash     Review of Systems:  Review of Systems  Constitutional: Negative for chills, fever and malaise/fatigue.  HENT: Negative for congestion, ear pain and sore throat.   Eyes: Negative.   Respiratory: Negative for cough, shortness of breath and wheezing.   Cardiovascular: Negative for chest pain, palpitations and leg swelling.  Gastrointestinal: Negative for abdominal  pain, blood in stool, constipation, diarrhea, heartburn and melena.  Genitourinary: Negative.   Musculoskeletal: Positive for back pain, joint pain and neck pain.  Skin: Negative.   Neurological: Negative for dizziness, sensory change, loss of consciousness and headaches.  Psychiatric/Behavioral: Negative for depression. The patient is not nervous/anxious and does not have insomnia.     Family history- Review and unchanged  Social history- Review and unchanged  Physical Exam: BP 132/74   Pulse 62   Temp 97.5 F (36.4 C)   Resp 16   Ht 5\' 1"  (1.549 m)   Wt 117 lb 3.2 oz (53.2 kg)   SpO2 99%   BMI 22.14 kg/m  Wt Readings from Last 3 Encounters:  04/30/16 117 lb 3.2 oz (53.2 kg)  01/28/16 115 lb 3.2 oz (52.3 kg)  10/08/15 110 lb (49.9 kg)    General Appearance: Well nourished well developed, in no apparent distress. Eyes: PERRLA, EOMs, conjunctiva no swelling or erythema ENT/Mouth: Ear canals normal without obstruction, swelling, erythma, discharge.  TMs normal bilaterally.  Oropharynx moist, clear, without exudate, or postoropharyngeal swelling. Slight depapillation of tongue, smooth, slightly erythematous but no obvious oral lesions Neck: Supple, thyroid normal,no cervical adenopathy  Respiratory: Respiratory effort normal, Breath sounds clear A&P without rhonchi, wheeze, or rale.  No retractions, no accessory usage. Cardio: RRR with no MRGs. Brisk peripheral pulses without edema.  Abdomen: Soft, + BS,  Non tender, no guarding, rebound, hernias, masses. Musculoskeletal: Full ROM, 5/5 strength, Normal gait Skin: Warm, dry without rashes, lesions, ecchymosis.  Neuro: Awake and oriented X 3, Cranial nerves intact. Normal muscle tone, no cerebellar symptoms. Psych: Normal affect, Insight and Judgment appropriate.    Vicie Mutters, PA-C 11:27 AM [facility]

## 2016-04-30 ENCOUNTER — Encounter: Payer: Self-pay | Admitting: Physician Assistant

## 2016-04-30 ENCOUNTER — Ambulatory Visit: Payer: Self-pay | Admitting: Internal Medicine

## 2016-04-30 ENCOUNTER — Ambulatory Visit (INDEPENDENT_AMBULATORY_CARE_PROVIDER_SITE_OTHER): Payer: Medicare HMO | Admitting: Physician Assistant

## 2016-04-30 VITALS — BP 132/74 | HR 62 | Temp 97.5°F | Resp 16 | Ht 61.0 in | Wt 117.2 lb

## 2016-04-30 DIAGNOSIS — Z79899 Other long term (current) drug therapy: Secondary | ICD-10-CM

## 2016-04-30 DIAGNOSIS — E559 Vitamin D deficiency, unspecified: Secondary | ICD-10-CM | POA: Diagnosis not present

## 2016-04-30 DIAGNOSIS — K140 Glossitis: Secondary | ICD-10-CM | POA: Diagnosis not present

## 2016-04-30 DIAGNOSIS — R7303 Prediabetes: Secondary | ICD-10-CM

## 2016-04-30 DIAGNOSIS — E782 Mixed hyperlipidemia: Secondary | ICD-10-CM | POA: Diagnosis not present

## 2016-04-30 DIAGNOSIS — E039 Hypothyroidism, unspecified: Secondary | ICD-10-CM

## 2016-04-30 DIAGNOSIS — I10 Essential (primary) hypertension: Secondary | ICD-10-CM

## 2016-04-30 DIAGNOSIS — D649 Anemia, unspecified: Secondary | ICD-10-CM | POA: Diagnosis not present

## 2016-04-30 LAB — CBC WITH DIFFERENTIAL/PLATELET
Basophils Absolute: 0 cells/uL (ref 0–200)
Basophils Relative: 0 %
EOS ABS: 63 {cells}/uL (ref 15–500)
EOS PCT: 1 %
HCT: 40.4 % (ref 35.0–45.0)
Hemoglobin: 13.6 g/dL (ref 11.7–15.5)
LYMPHS PCT: 44 %
Lymphs Abs: 2772 cells/uL (ref 850–3900)
MCH: 31.1 pg (ref 27.0–33.0)
MCHC: 33.7 g/dL (ref 32.0–36.0)
MCV: 92.4 fL (ref 80.0–100.0)
MONOS PCT: 5 %
MPV: 10 fL (ref 7.5–12.5)
Monocytes Absolute: 315 cells/uL (ref 200–950)
NEUTROS PCT: 50 %
Neutro Abs: 3150 cells/uL (ref 1500–7800)
PLATELETS: 155 10*3/uL (ref 140–400)
RBC: 4.37 MIL/uL (ref 3.80–5.10)
RDW: 13.1 % (ref 11.0–15.0)
WBC: 6.3 10*3/uL (ref 3.8–10.8)

## 2016-04-30 LAB — IRON AND TIBC
%SAT: 24 % (ref 11–50)
Iron: 91 ug/dL (ref 45–160)
TIBC: 372 ug/dL (ref 250–450)
UIBC: 281 ug/dL (ref 125–400)

## 2016-04-30 LAB — HEPATIC FUNCTION PANEL
ALT: 10 U/L (ref 6–29)
AST: 22 U/L (ref 10–35)
Albumin: 4.4 g/dL (ref 3.6–5.1)
Alkaline Phosphatase: 45 U/L (ref 33–130)
BILIRUBIN TOTAL: 0.5 mg/dL (ref 0.2–1.2)
Bilirubin, Direct: 0.1 mg/dL (ref <=0.2)
Indirect Bilirubin: 0.4 mg/dL (ref 0.2–1.2)
Total Protein: 6.7 g/dL (ref 6.1–8.1)

## 2016-04-30 LAB — BASIC METABOLIC PANEL WITH GFR
BUN: 25 mg/dL (ref 7–25)
CALCIUM: 9.5 mg/dL (ref 8.6–10.4)
CHLORIDE: 101 mmol/L (ref 98–110)
CO2: 24 mmol/L (ref 20–31)
CREATININE: 1.37 mg/dL — AB (ref 0.60–0.93)
GFR, Est African American: 44 mL/min — ABNORMAL LOW (ref >=60)
GFR, Est Non African American: 38 mL/min — ABNORMAL LOW (ref >=60)
Glucose, Bld: 100 mg/dL — ABNORMAL HIGH (ref 65–99)
Potassium: 4.3 mmol/L (ref 3.5–5.3)
Sodium: 140 mmol/L (ref 135–146)

## 2016-04-30 LAB — TSH: TSH: 1.48 m[IU]/L

## 2016-04-30 LAB — LIPID PANEL
CHOLESTEROL: 189 mg/dL (ref <200)
HDL: 66 mg/dL (ref >50)
LDL Cholesterol: 93 mg/dL (ref <100)
Total CHOL/HDL Ratio: 2.9 Ratio (ref <5.0)
Triglycerides: 152 mg/dL — ABNORMAL HIGH (ref <150)
VLDL: 30 mg/dL (ref <30)

## 2016-05-01 LAB — MAGNESIUM: MAGNESIUM: 2 mg/dL (ref 1.5–2.5)

## 2016-05-01 LAB — VITAMIN B12: VITAMIN B 12: 287 pg/mL (ref 200–1100)

## 2016-05-01 NOTE — Progress Notes (Signed)
Pt aware of lab results & voiced understanding of those results.

## 2016-05-05 LAB — ZINC

## 2016-05-13 ENCOUNTER — Other Ambulatory Visit: Payer: Self-pay | Admitting: Internal Medicine

## 2016-05-13 ENCOUNTER — Other Ambulatory Visit: Payer: Self-pay | Admitting: Physician Assistant

## 2016-05-25 ENCOUNTER — Other Ambulatory Visit: Payer: Self-pay | Admitting: Internal Medicine

## 2016-07-21 ENCOUNTER — Other Ambulatory Visit: Payer: Self-pay | Admitting: Internal Medicine

## 2016-07-23 ENCOUNTER — Encounter: Payer: Self-pay | Admitting: Internal Medicine

## 2016-08-05 ENCOUNTER — Ambulatory Visit (INDEPENDENT_AMBULATORY_CARE_PROVIDER_SITE_OTHER): Payer: Medicare HMO | Admitting: Internal Medicine

## 2016-08-05 VITALS — BP 118/74 | HR 64 | Temp 97.1°F | Resp 16

## 2016-08-05 DIAGNOSIS — R7303 Prediabetes: Secondary | ICD-10-CM

## 2016-08-05 DIAGNOSIS — Z79899 Other long term (current) drug therapy: Secondary | ICD-10-CM | POA: Diagnosis not present

## 2016-08-05 DIAGNOSIS — I10 Essential (primary) hypertension: Secondary | ICD-10-CM

## 2016-08-05 DIAGNOSIS — E039 Hypothyroidism, unspecified: Secondary | ICD-10-CM

## 2016-08-05 DIAGNOSIS — Z0001 Encounter for general adult medical examination with abnormal findings: Secondary | ICD-10-CM

## 2016-08-05 DIAGNOSIS — Z1212 Encounter for screening for malignant neoplasm of rectum: Secondary | ICD-10-CM

## 2016-08-05 DIAGNOSIS — Z136 Encounter for screening for cardiovascular disorders: Secondary | ICD-10-CM | POA: Diagnosis not present

## 2016-08-05 DIAGNOSIS — Z1211 Encounter for screening for malignant neoplasm of colon: Secondary | ICD-10-CM | POA: Diagnosis not present

## 2016-08-05 DIAGNOSIS — E559 Vitamin D deficiency, unspecified: Secondary | ICD-10-CM | POA: Diagnosis not present

## 2016-08-05 DIAGNOSIS — E782 Mixed hyperlipidemia: Secondary | ICD-10-CM

## 2016-08-05 LAB — CBC WITH DIFFERENTIAL/PLATELET
BASOS ABS: 0 {cells}/uL (ref 0–200)
BASOS PCT: 0 %
EOS PCT: 2 %
Eosinophils Absolute: 122 cells/uL (ref 15–500)
HCT: 40.1 % (ref 35.0–45.0)
HEMOGLOBIN: 13.1 g/dL (ref 11.7–15.5)
LYMPHS ABS: 2623 {cells}/uL (ref 850–3900)
Lymphocytes Relative: 43 %
MCH: 30.6 pg (ref 27.0–33.0)
MCHC: 32.7 g/dL (ref 32.0–36.0)
MCV: 93.7 fL (ref 80.0–100.0)
MONOS PCT: 6 %
MPV: 10.3 fL (ref 7.5–12.5)
Monocytes Absolute: 366 cells/uL (ref 200–950)
NEUTROS ABS: 2989 {cells}/uL (ref 1500–7800)
Neutrophils Relative %: 49 %
PLATELETS: 164 10*3/uL (ref 140–400)
RBC: 4.28 MIL/uL (ref 3.80–5.10)
RDW: 13.4 % (ref 11.0–15.0)
WBC: 6.1 10*3/uL (ref 3.8–10.8)

## 2016-08-05 LAB — TSH: TSH: 1.05 m[IU]/L

## 2016-08-05 NOTE — Patient Instructions (Signed)

## 2016-08-06 LAB — MAGNESIUM: MAGNESIUM: 2.1 mg/dL (ref 1.5–2.5)

## 2016-08-06 LAB — MICROALBUMIN / CREATININE URINE RATIO
Creatinine, Urine: 97 mg/dL (ref 20–320)
MICROALB/CREAT RATIO: 2 ug/mg{creat} (ref <30)
Microalb, Ur: 0.2 mg/dL

## 2016-08-06 LAB — URINALYSIS, ROUTINE W REFLEX MICROSCOPIC
Bilirubin Urine: NEGATIVE
GLUCOSE, UA: NEGATIVE
HGB URINE DIPSTICK: NEGATIVE
Ketones, ur: NEGATIVE
Leukocytes, UA: NEGATIVE
Nitrite: NEGATIVE
PH: 7.5 (ref 5.0–8.0)
Protein, ur: NEGATIVE
SPECIFIC GRAVITY, URINE: 1.01 (ref 1.001–1.035)

## 2016-08-06 LAB — BASIC METABOLIC PANEL WITH GFR
BUN: 16 mg/dL (ref 7–25)
CO2: 24 mmol/L (ref 20–31)
Calcium: 9.2 mg/dL (ref 8.6–10.4)
Chloride: 102 mmol/L (ref 98–110)
Creat: 1.49 mg/dL — ABNORMAL HIGH (ref 0.60–0.93)
GFR, Est African American: 40 mL/min — ABNORMAL LOW (ref >=60)
GFR, Est Non African American: 35 mL/min — ABNORMAL LOW (ref >=60)
Glucose, Bld: 90 mg/dL (ref 65–99)
Potassium: 4.5 mmol/L (ref 3.5–5.3)
Sodium: 141 mmol/L (ref 135–146)

## 2016-08-06 LAB — HEPATIC FUNCTION PANEL
ALT: 11 U/L (ref 6–29)
AST: 23 U/L (ref 10–35)
Albumin: 4.3 g/dL (ref 3.6–5.1)
Alkaline Phosphatase: 54 U/L (ref 33–130)
Bilirubin, Direct: 0.1 mg/dL (ref <=0.2)
Indirect Bilirubin: 0.3 mg/dL (ref 0.2–1.2)
Total Bilirubin: 0.4 mg/dL (ref 0.2–1.2)
Total Protein: 6.6 g/dL (ref 6.1–8.1)

## 2016-08-06 LAB — LIPID PANEL
CHOL/HDL RATIO: 3.6 ratio (ref <5.0)
Cholesterol: 193 mg/dL (ref <200)
HDL: 54 mg/dL (ref >50)
LDL Cholesterol: 90 mg/dL (ref <100)
Triglycerides: 247 mg/dL — ABNORMAL HIGH (ref <150)
VLDL: 49 mg/dL — AB (ref <30)

## 2016-08-06 LAB — VITAMIN D 25 HYDROXY (VIT D DEFICIENCY, FRACTURES): Vit D, 25-Hydroxy: 79 ng/mL (ref 30–100)

## 2016-08-06 LAB — HEMOGLOBIN A1C
Hgb A1c MFr Bld: 5.4 % (ref <5.7)
Mean Plasma Glucose: 108 mg/dL

## 2016-08-06 LAB — INSULIN, RANDOM: Insulin: 8.3 u[IU]/mL (ref 2.0–19.6)

## 2016-08-09 ENCOUNTER — Encounter: Payer: Self-pay | Admitting: Internal Medicine

## 2016-08-09 NOTE — Progress Notes (Signed)
[facility] Unk Pinto, M.D.      Uvaldo Bristle. Silverio Lay, P.A.-C [facility]                [address] [place], [place] [address] Telephone [phone] Telefax [phone]  Annual Screening/Preventative Visit & Comprehensive Evaluation &  Examination     This very nice 71 y.o. MWF presents for a Screening/Preventative Visit & comprehensive evaluation and management of multiple medical co-morbidities.  Patient has been followed for HTN, Prediabetes, Hyperlipidemia and Vitamin D Deficiency.      HTN predates since 1999. Patient's BP has been controlled at home and patient denies any cardiac symptoms as chest pain, palpitations, shortness of breath, dizziness or ankle swelling. Today's BP mis at goal - 118/74.      Patient's hyperlipidemia is controlled with diet and medications. Patient denies myalgias or other medication SE's. Last lipids were at goal albeit elevated Trig's Lab Results  Component Value Date   CHOL 193 08/05/2016   HDL 54 08/05/2016   LDLCALC 90 08/05/2016   TRIG 247 (H) 08/05/2016   CHOLHDL 3.6 08/05/2016      Patient has prediabetes (A1c 5.8% since 2011 / 5.9% in 2014)  and patient denies reactive hypoglycemic symptoms, visual blurring, diabetic polys, or paresthesias. Last A1c was at goal: Lab Results  Component Value Date   HGBA1C 5.4 08/05/2016      Patient has been on Thyroid Replacement since 2014. Finally, patient has history of Vitamin D Deficiency and last Vitamin D was at goal: Lab Results  Component Value Date   VD25OH 79 08/05/2016   Current Outpatient Prescriptions on File Prior to Visit  Medication Sig  . ALPRAZolam (XANAX) 1 MG tablet TAKE 1 TABLET AT BEDTIME  . atenolol (TENORMIN) 100 MG tablet TAKE 1 TABLET EVERY DAY  . benazepril (LOTENSIN) 20 MG tablet TAKE 1 TABLET EVERY DAY  . Cholecalciferol (D 5000) 5000 UNITS capsule Take 5,000 Units by mouth daily.   Marland Kitchen estradiol (ESTRACE) 1 MG tablet TAKE 1 TABLET EVERY DAY  . furosemide (LASIX) 80 MG tablet TAKE 1 TABLET EVERY DAY  . levothyroxine (SYNTHROID, LEVOTHROID) 50 MCG tablet TAKE 1 TABLET EVERY DAY BEFORE BREAKFAST  . Magnesium 500 MG TABS Take 500 mg by mouth daily.  . meloxicam (MOBIC) 15 MG tablet TAKE 1 TABLET EVERY DAY AS NEEDED FOR PAIN  . omeprazole (PRILOSEC) 20 MG capsule TAKE 1 CAPSULE EVERY DAY  . rosuvastatin (CRESTOR) 40 MG tablet TAKE 1 TABLET EVERY DAY  OR AS DIRECTED FOR CHOLESTEROL  . tiZANidine (ZANAFLEX) 4 MG tablet TAKE 1 TABLET EVERY 8 HOURS AS NEEDED FOR MUSCLE SPASMS   No current facility-administered medications on file prior to visit.    Allergies  Allergen Reactions  . Lopid [Gemfibrozil]     headache  . Morphine And Related Other (See Comments)    Reaction: unknown   . Nickel Other (See Comments)    Reaction: unknown   . Latex Rash   Past Medical History:  Diagnosis Date  . GERD (gastroesophageal reflux disease)   . Hyperlipidemia   . Hypertension   . Other abnormal glucose   . Unspecified hypothyroidism   . Unspecified vitamin D deficiency    Health Maintenance  Topic Date Due  . Hepatitis C Screening  [birth year]  . COLONOSCOPY  06/23/2016  . INFLUENZA VACCINE  08/05/2016  .  TETANUS/TDAP  11/08/2016  . MAMMOGRAM  07/10/2017  . DEXA SCAN  Completed  . PNA vac Low Risk Adult  Completed   Immunization History  Administered Date(s) Administered  . Influenza, High Dose Seasonal PF 09/19/2013, 10/16/2014, 10/08/2015  . Influenza-Unspecified 09/15/2012  . Pneumococcal Conjugate-13 08/08/2015  . Pneumococcal-Unspecified 03/18/2010  . Td 11/09/2006   Past Surgical History:  Procedure Laterality Date  . ABDOMINAL HYSTERECTOMY    . CARPAL TUNNEL RELEASE Bilateral 2001  . EYE SURGERY    . NASAL SEPTUM SURGERY    . TUBAL LIGATION     Family History  Problem Relation Age of Onset  . Hypertension Mother   . Hyperlipidemia Mother   . Heart  disease Father   . Alcohol abuse Father   . Hyperlipidemia Father   . Hypertension Father   . Heart disease Sister   . Stroke Sister   . Depression Sister   . Heart disease Brother    Social History  Substance Use Topics  . Smoking status: Former Smoker    Quit date: 03/20/1988  . Smokeless tobacco: Never Used  . Alcohol use Yes     Comment: rare glass of wine    ROS Constitutional: Denies fever, chills, weight loss/gain, headaches, insomnia,  night sweats, and change in appetite. Does c/o fatigue. Eyes: Denies redness, blurred vision, diplopia, discharge, itchy, watery eyes.  ENT: Denies discharge, congestion, post nasal drip, epistaxis, sore throat, earache, hearing loss, dental pain, Tinnitus, Vertigo, Sinus pain, snoring.  Cardio: Denies chest pain, palpitations, irregular heartbeat, syncope, dyspnea, diaphoresis, orthopnea, PND, claudication, edema Respiratory: denies cough, dyspnea, DOE, pleurisy, hoarseness, laryngitis, wheezing.  Gastrointestinal: Denies dysphagia, heartburn, reflux, water brash, pain, cramps, nausea, vomiting, bloating, diarrhea, constipation, hematemesis, melena, hematochezia, jaundice, hemorrhoids Genitourinary: Denies dysuria, frequency, urgency, nocturia, hesitancy, discharge, hematuria, flank pain Breast: Breast lumps, nipple discharge, bleeding.  Musculoskeletal: Denies arthralgia, myalgia, stiffness, Jt. Swelling, pain, limp, and strain/sprain. Denies falls. Skin: Denies puritis, rash, hives, warts, acne, eczema, changing in skin lesion Neuro: No weakness, tremor, incoordination, spasms, paresthesia, pain Psychiatric: Denies confusion, memory loss, sensory loss. Denies Depression. Endocrine: Denies change in weight, skin, hair change, nocturia, and paresthesia, diabetic polys, visual blurring, hyper / hypo glycemic episodes.  Heme/Lymph: No excessive bleeding, bruising, enlarged lymph nodes.  Physical Exam  BP 118/74   Pulse 64   Temp (!) 97.1 F  (36.2 C)   Resp 16   General Appearance: Well nourished, well groomed and in no apparent distress.  Eyes: PERRLA, EOMs, conjunctiva no swelling or erythema, normal fundi and vessels. Sinuses: No frontal/maxillary tenderness ENT/Mouth: EACs patent / TMs  nl. Nares clear without erythema, swelling, mucoid exudates. Oral hygiene is good. No erythema, swelling, or exudate. Tongue normal, non-obstructing. Tonsils not swollen or erythematous. Hearing normal.  Neck: Supple, thyroid normal. No bruits, nodes or JVD. Respiratory: Respiratory effort normal.  BS equal and clear bilateral without rales, rhonci, wheezing or stridor. Cardio: Heart sounds are normal with regular rate and rhythm and no murmurs, rubs or gallops. Peripheral pulses are normal and equal bilaterally without edema. No aortic or femoral bruits. Chest: symmetric with normal excursions and percussion. Breasts: Symmetric, without lumps, nipple discharge, retractions, or fibrocystic changes.  Abdomen: Flat, soft with bowel sounds active. Nontender, no guarding, rebound, hernias, masses, or organomegaly.  Lymphatics: Non tender without lymphadenopathy.  Genitourinary:  Musculoskeletal: Full ROM all peripheral extremities, joint stability, 5/5 strength, and normal gait. Skin: Warm and dry without rashes, lesions, cyanosis, clubbing or  ecchymosis.  Neuro: Cranial nerves intact, reflexes equal bilaterally. Normal muscle tone, no cerebellar symptoms. Sensation intact.  Pysch: Alert and oriented X 3, normal affect, Insight and Judgment appropriate.   Assessment and Plan  1. Annual Preventative Screening Examination   2. Essential hypertension  - EKG 12-Lead - Urinalysis, Routine w reflex microscopic - Microalbumin / creatinine urine ratio - CBC with Differential/Platelet - BASIC METABOLIC PANEL WITH GFR - Magnesium - TSH  3. Hyperlipidemia, mixed  - EKG 12-Lead - Hepatic function panel - Lipid panel  4. Prediabetes  -  EKG 12-Lead - Hemoglobin A1c - Insulin, random  5. Vitamin D deficiency  - VITAMIN D 25 Hydroxy  6. Hypothyroidism, unspecified type  - TSH  7. Screening for rectal cancer  - POC Hemoccult Bld/Stl   8. Screening for ischemic heart disease  - EKG 12-Lead  9. Medication management  - Urinalysis, Routine w reflex microscopic - Microalbumin / creatinine urine ratio - CBC with Differential/Platelet - BASIC METABOLIC PANEL WITH GFR - Hepatic function panel - Magnesium - Lipid panel - TSH - Hemoglobin A1c - Insulin, random - VITAMIN D 25 Hydroxy        Patient was counseled in prudent diet to achieve/maintain BMI less than 25 for weight control, BP monitoring, regular exercise and medications. Discussed med's effects and SE's. Screening labs and tests as requested with regular follow-up as recommended. Over 40 minutes of exam, counseling, chart review and high complex critical decision making was performed.

## 2016-08-13 ENCOUNTER — Other Ambulatory Visit: Payer: Self-pay | Admitting: Internal Medicine

## 2016-08-13 DIAGNOSIS — I10 Essential (primary) hypertension: Secondary | ICD-10-CM

## 2016-08-13 MED ORDER — BENAZEPRIL HCL 40 MG PO TABS: ORAL_TABLET | ORAL | 3 refills | Status: DC

## 2016-08-26 ENCOUNTER — Other Ambulatory Visit: Payer: Self-pay

## 2016-08-26 DIAGNOSIS — Z1212 Encounter for screening for malignant neoplasm of rectum: Secondary | ICD-10-CM

## 2016-08-26 LAB — POC HEMOCCULT BLD/STL (HOME/3-CARD/SCREEN)
Card #3 Fecal Occult Blood, POC: NEGATIVE
FECAL OCCULT BLD: NEGATIVE
Fecal Occult Blood, POC: NEGATIVE

## 2016-09-14 ENCOUNTER — Other Ambulatory Visit: Payer: Self-pay | Admitting: Internal Medicine

## 2016-09-22 ENCOUNTER — Other Ambulatory Visit: Payer: Self-pay | Admitting: Internal Medicine

## 2016-09-22 ENCOUNTER — Other Ambulatory Visit: Payer: Self-pay | Admitting: *Deleted

## 2016-09-22 MED ORDER — ALPRAZOLAM 1 MG PO TABS: 1.0000 mg | ORAL_TABLET | Freq: Every day | ORAL | 0 refills | Status: DC

## 2016-09-22 MED ORDER — ESTRADIOL 1 MG PO TABS: 1.0000 mg | ORAL_TABLET | Freq: Every day | ORAL | 3 refills | Status: DC

## 2016-09-22 MED ORDER — ALPRAZOLAM 1 MG PO TABS: ORAL_TABLET | ORAL | 1 refills | Status: DC

## 2016-09-28 ENCOUNTER — Other Ambulatory Visit: Payer: Self-pay | Admitting: Internal Medicine

## 2016-09-28 ENCOUNTER — Other Ambulatory Visit: Payer: Self-pay | Admitting: Physician Assistant

## 2016-11-06 IMAGING — MR MR CERVICAL SPINE W/O CM
4 of 5 series · 19 of 48 positions shown · non-contrast
Comparison: 03/30/2015 cervical spine CT.

CLINICAL DATA: 70-year-old female left-sided neck swelling with
pain in left shoulder. No known injury. No prior surgery. Subsequent
encounter.

EXAM:
MRI CERVICAL SPINE WITHOUT CONTRAST
TECHNIQUE: Multiplanar, multisequence MR imaging of the cervical spine was
performed. No intravenous contrast was administered.

[Series 2: T2 · sagittal · 3.0mm · 0.43mm/px · 6 of 13 slices shown (1 of 2)]
[im 1/13]
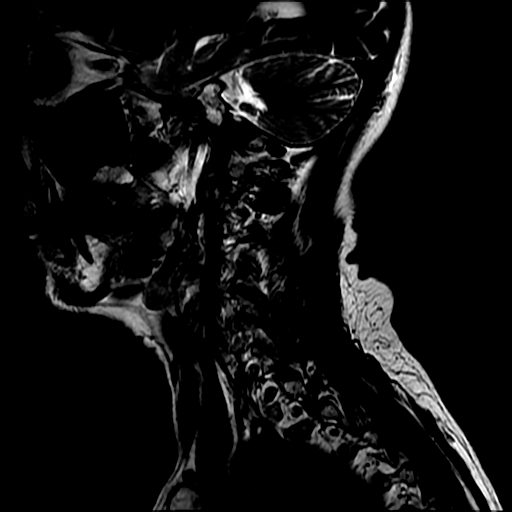
[im 3/13]
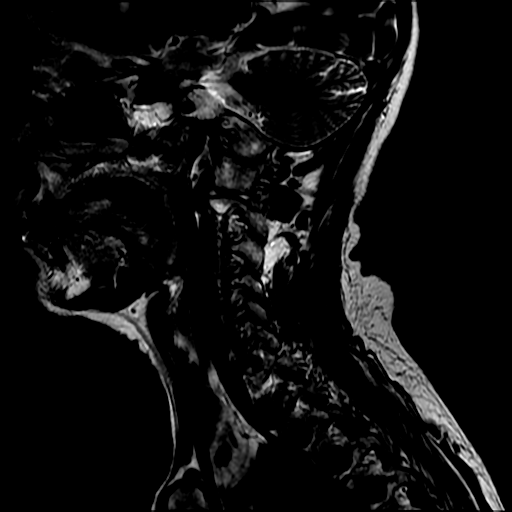
[im 5/13]
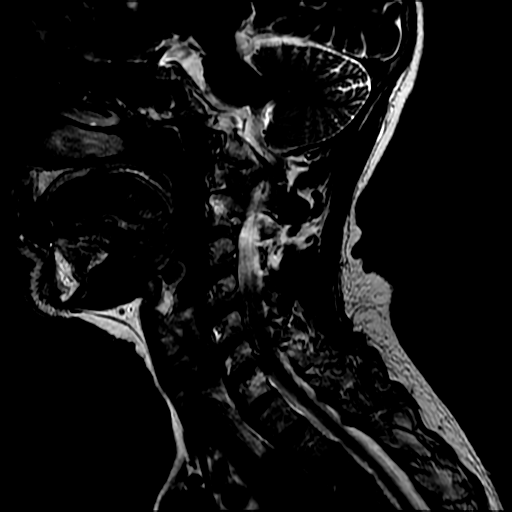
[im 8/13]
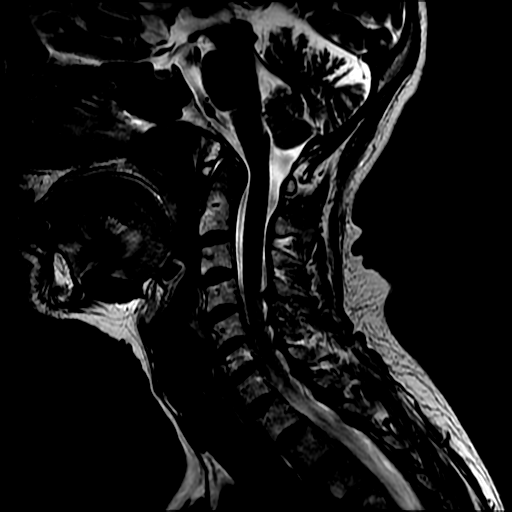
[im 10/13]
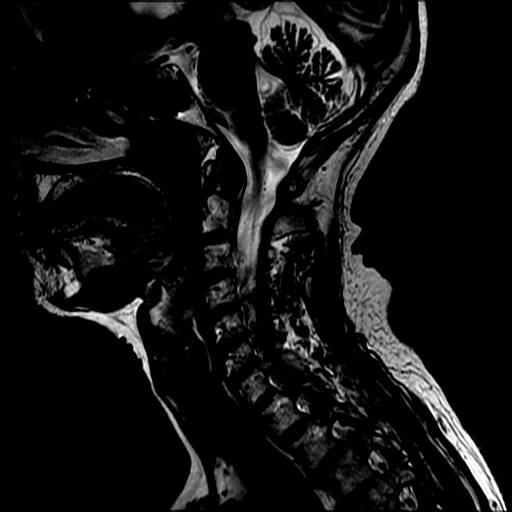
[im 13/13]
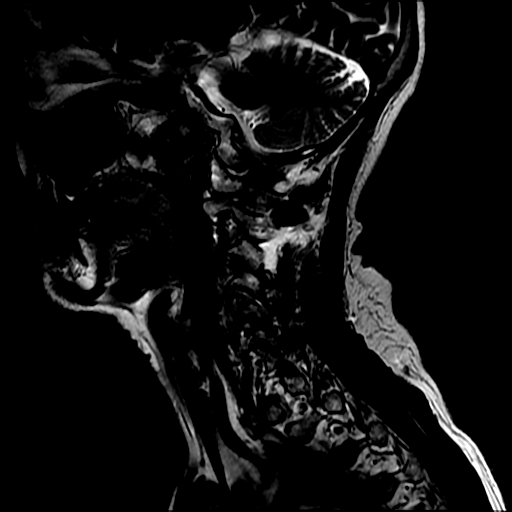

[Series 3: FLAIR · sagittal · 3.0mm · 0.43mm/px · 3 of 13 slices shown]
[im 3/13]
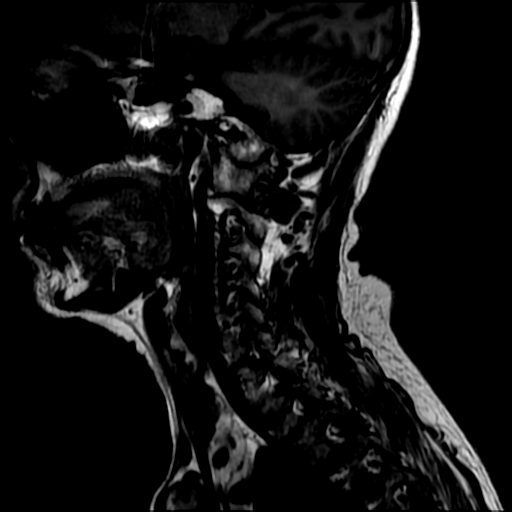
[im 8/13]
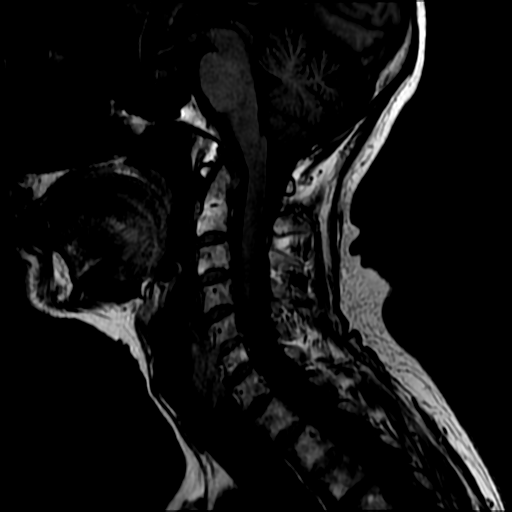
[im 13/13]
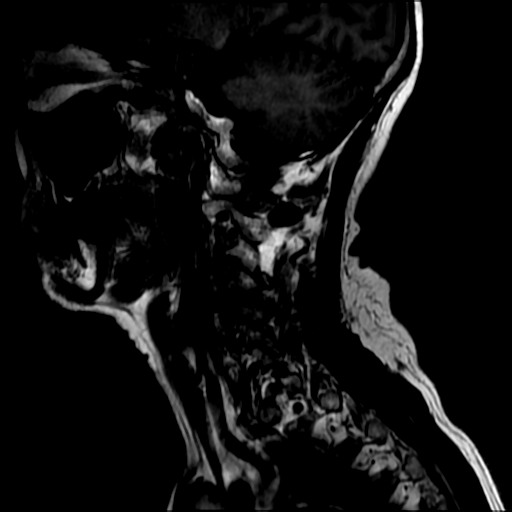

[Series 4: STIR · sagittal · 3.0mm · 0.43mm/px · 3 of 13 slices shown]
[im 3/13]
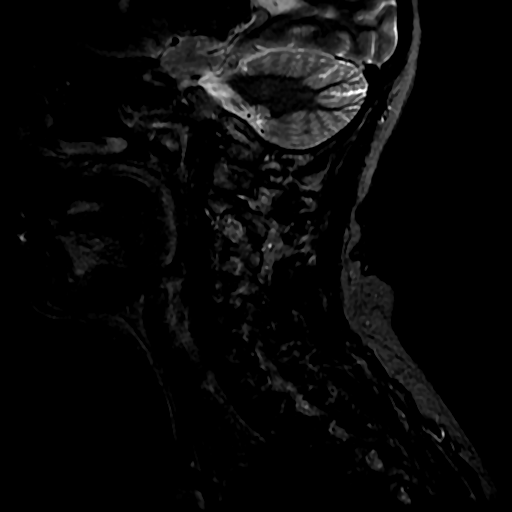
[im 8/13]
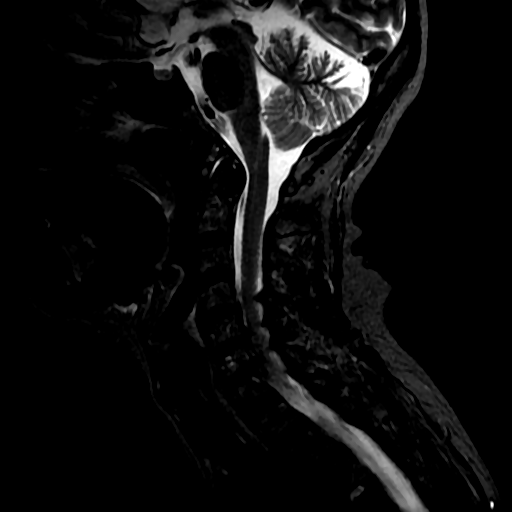
[im 13/13]
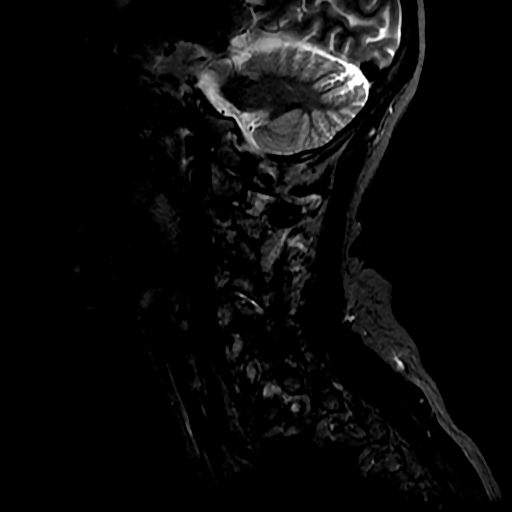

[Series 6: T2 · axial · 3.0mm · 0.35mm/px · z∈[-64,+11]mm · 7 of 29 slices shown (2 of 2)]
[im 1/29]
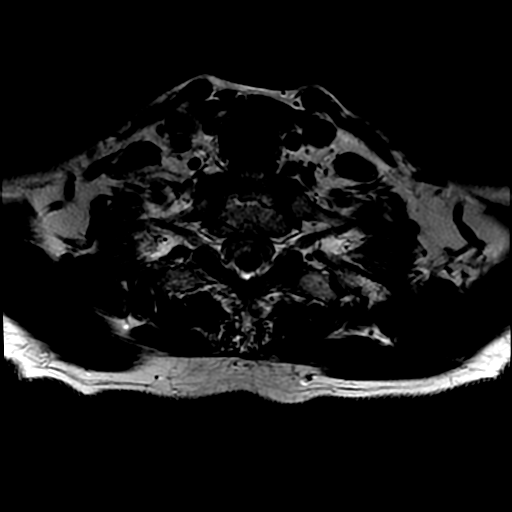
[im 5/29]
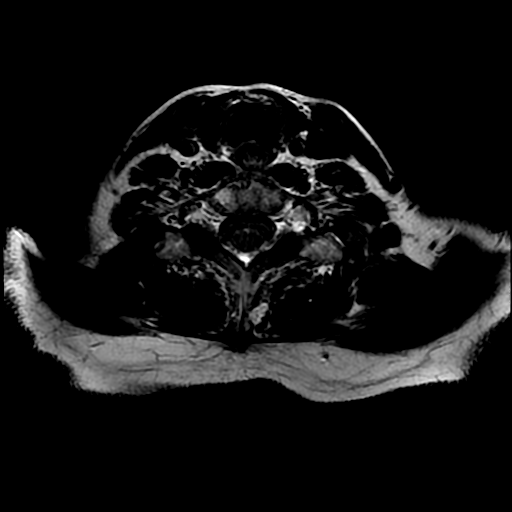
[im 9/29]
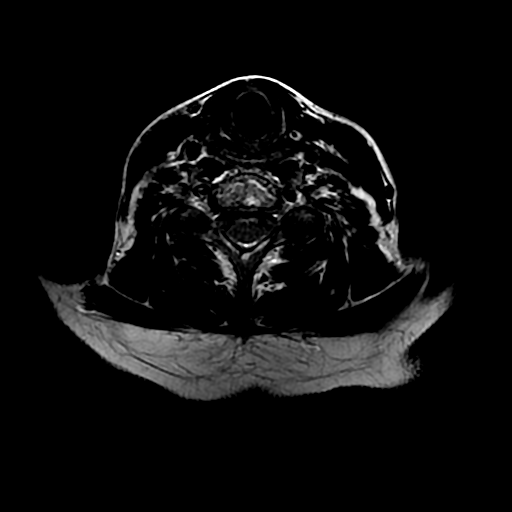
[im 13/29]
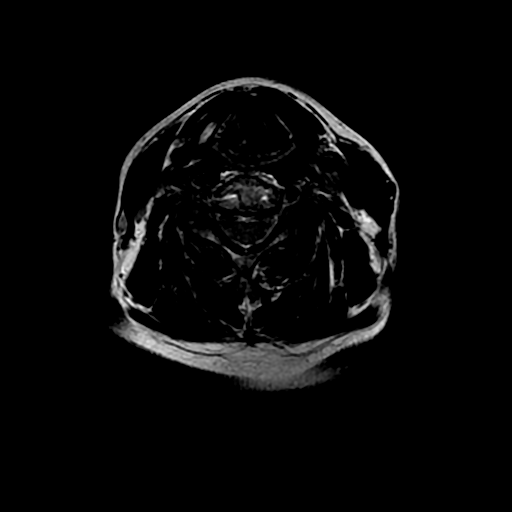
[im 16/29]
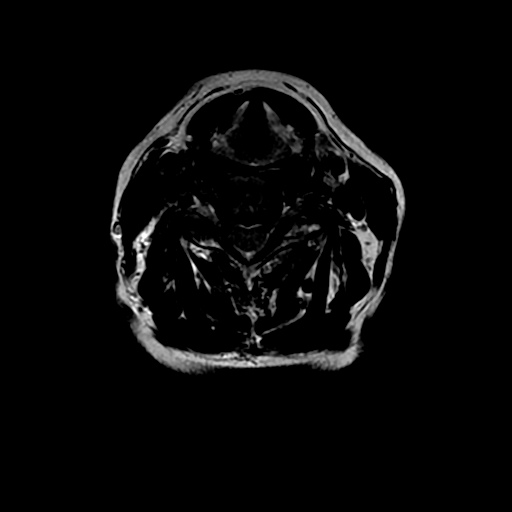
[im 20/29]
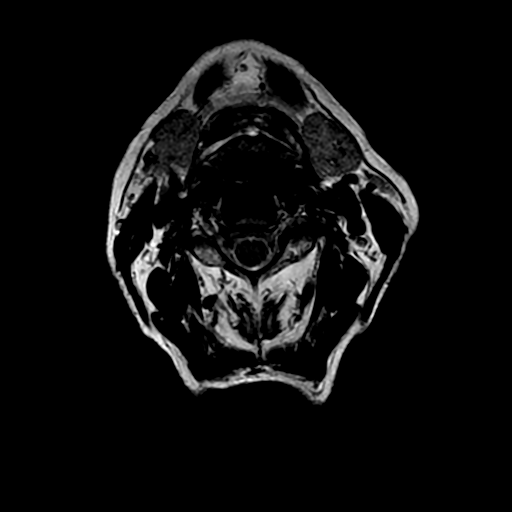
[im 24/29]
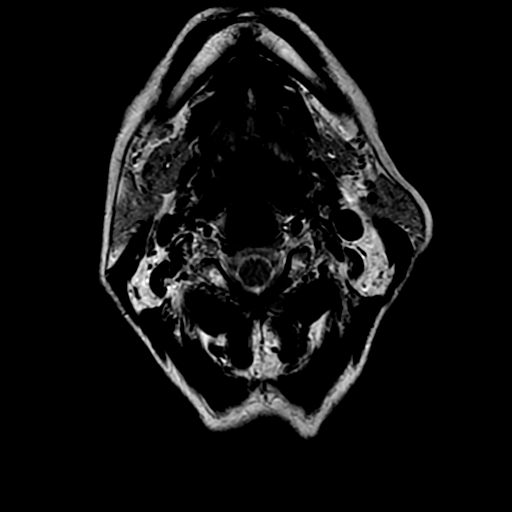

[19 of 48 positions shown; findings below may reference images not displayed]

FINDINGS: Alignment: Normal.

Vertebrae: No worrisome osseous abnormality.

Cord: No focal cord abnormality.

Posterior Fossa, vertebral arteries, paraspinal tissues: No
worrisome abnormality.

Disc levels:

C2-3:  Negative.

C3-4: Left facet degenerative changes. Minimal left foraminal
narrowing.

C4-5: Moderate left facet degenerative changes with tiny amount of
fluid in the joint space. No other findings to suggest infectious
arthropathy and therefore probably degenerative in origin. Bulge
with tiny central protrusion. Minimal central cord contact. Minimal
left foraminal narrowing.

C5-6: Broad-based disc osteophyte complex greater to left. Narrowing
ventral thecal sac with minimal cord contact.

C6-7: Shallow disc osteophyte complex. Mild narrowing ventral thecal
sac. Minimal foraminal narrowing bilaterally.

C7-T1:  Negative.
IMPRESSION: C3-4 left facet degenerative changes. Minimal left foraminal
narrowing.

C4-5 moderate left facet degenerative changes. Bulge with tiny
central protrusion. Minimal central cord contact. Minimal left
foraminal narrowing.

C5-6 broad-based disc osteophyte complex greater to left. Narrowing
ventral thecal sac with minimal cord contact.

C6-7 shallow disc osteophyte complex. Mild narrowing ventral thecal
sac.

## 2016-11-25 ENCOUNTER — Other Ambulatory Visit: Payer: Self-pay | Admitting: Physician Assistant

## 2016-12-08 ENCOUNTER — Ambulatory Visit: Payer: Self-pay | Admitting: Physician Assistant

## 2016-12-08 NOTE — Progress Notes (Deleted)
Patient ID: Stacy Wallace, female   DOB: [birth year], 71 y.o.   MRN: [id number]  MEDICARE ANNUAL WELLNESS VISIT AND FOLLOW UP  Assessment:     Over 30 minutes of exam, counseling, chart review, and critical decision making was performed  Future Appointments  Date Time Provider [place]  12/08/2016  2:30 PM Vicie Mutters, PA-C [facility] None  03/08/2017  2:30 PM Unk Pinto, MD [facility] None  09/02/2017  3:00 PM Unk Pinto, MD [facility] None    Plan:   During the course of the visit the patient was educated and counseled about appropriate screening and preventive services including:    Pneumococcal vaccine   Influenza vaccine  Td vaccine  Prevnar 13  Screening electrocardiogram  Screening mammography  Bone densitometry screening  Colorectal cancer screening  Diabetes screening  Glaucoma screening  Nutrition counseling   Advanced directives: given info/requested copies   Subjective:   Stacy Wallace is a 71 y.o. female who presents for Medicare Annual Wellness Visit and 3 month follow up on hypertension, prediabetes, hyperlipidemia, vitamin D def.   Her blood pressure has been controlled at home, today their BP is   She does not workout. She denies chest pain, shortness of breath, dizziness.  She is on cholesterol medication and denies myalgias. Her cholesterol is at goal. The cholesterol last visit was:   Lab Results  Component Value Date   CHOL 193 08/05/2016   HDL 54 08/05/2016   LDLCALC 90 08/05/2016   TRIG 247 (H) 08/05/2016   CHOLHDL 3.6 08/05/2016   Patient reports that she has not been monitoring her diet.  She has a history of prediabetes which is diet controlled.  She has not symptoms of polydipsia, polyphagia, or polyuria.   Lab Results  Component Value Date   HGBA1C 5.4 08/05/2016   Last GFR Lab Results  Component Value Date   GFRNONAA 35 (L) 08/05/2016   Patient is on Vitamin D supplement. Lab Results  Component  Value Date   VD25OH 79 08/05/2016     BMI is There is no height or weight on file to calculate BMI., she is working on diet and exercise. Wt Readings from Last 3 Encounters:  04/30/16 117 lb 3.2 oz (53.2 kg)  01/28/16 115 lb 3.2 oz (52.3 kg)  10/08/15 110 lb (49.9 kg)    Medication Review Current Outpatient Medications on File Prior to Visit  Medication Sig Dispense Refill  . ALPRAZolam (XANAX) 1 MG tablet Take 1/2 to 1 tablet at hour of sleep ONLY as needed 90 tablet 1  . atenolol (TENORMIN) 100 MG tablet TAKE 1 TABLET EVERY DAY 90 tablet 1  . benazepril (LOTENSIN) 40 MG tablet Take tablet at night for BP 90 tablet 3  . BIOTIN PO Take 1 capsule by mouth daily.    . Cholecalciferol (D 5000) 5000 UNITS capsule Take 5,000 Units by mouth daily.    Marland Kitchen estradiol (ESTRACE) 1 MG tablet Take 1 tablet (1 mg total) by mouth daily. 90 tablet 3  . furosemide (LASIX) 80 MG tablet TAKE 1 TABLET EVERY DAY 90 tablet 4  . levothyroxine (SYNTHROID, LEVOTHROID) 50 MCG tablet TAKE 1 TABLET EVERY DAY BEFORE BREAKFAST 90 tablet 1  . Magnesium 500 MG TABS Take 500 mg by mouth daily.    . meloxicam (MOBIC) 15 MG tablet TAKE 1 TABLET EVERY DAY AS NEEDED FOR PAIN 90 tablet 1  . omeprazole (PRILOSEC) 20 MG capsule TAKE 1 CAPSULE EVERY DAY 90 capsule 3  .  rosuvastatin (CRESTOR) 40 MG tablet TAKE 1 TABLET EVERY DAY OR AS DIRECTED FOR CHOLESTEROL 90 tablet 1  . tiZANidine (ZANAFLEX) 4 MG tablet TAKE 1 TABLET EVERY 8 HOURS AS NEEDED FOR MUSCLE SPASMS 270 tablet 1   No current facility-administered medications on file prior to visit.     Allergies: Allergies  Allergen Reactions  . Lopid [Gemfibrozil]     headache  . Morphine And Related Other (See Comments)    Reaction: unknown   . Nickel Other (See Comments)    Reaction: unknown   . Latex Rash    Current Problems (verified) has Hyperlipidemia; Hypertension; Hypothyroidism; Prediabetes; Vitamin D deficiency; GERD (gastroesophageal reflux disease);  Medication management; Insomnia; and DDD (degenerative disc disease), lumbar on their problem list.  Screening Tests Immunization History  Administered Date(s) Administered  . Influenza, High Dose Seasonal PF 09/19/2013, 10/16/2014, 10/08/2015  . Influenza-Unspecified 09/15/2012  . Pneumococcal Conjugate-13 08/08/2015  . Pneumococcal-Unspecified 03/18/2010  . Td 11/09/2006    Preventative care: Last colonoscopy: 2008 Last mammogram: 07/11/15 DEXA: 2013  Prior vaccinations: TD or Tdap: 2008  Influenza: 2016 Pneumococcal: 2012 Prevnar13: Due today Shingles/Zostavax: Declined   Names of Other Physician/Practitioners you currently use: 1. [facility]- here for primary care 2. Dr. Katy Fitch, eye doctor, last visit Due to visit in 2 weeks.   3. Does not see, dentist, last visit remote Patient Care Team: Unk Pinto, MD as PCP - General (Internal Medicine)  Surgical: She  has a past surgical history that includes Tubal ligation; Eye surgery; Abdominal hysterectomy; Carpal tunnel release (Bilateral, 2001); and Nasal septum surgery. Family Her family history includes Alcohol abuse in her father; Depression in her sister; Heart disease in her brother, father, and sister; Hyperlipidemia in her father and mother; Hypertension in her father and mother; Stroke in her sister. Social history  She reports that she quit smoking about 28 years ago. she has never used smokeless tobacco. She reports that she drinks alcohol. Her drug history is not on file.  MEDICARE WELLNESS OBJECTIVES: Physical activity:   Cardiac risk factors:   Depression/mood screen:   Depression screen [facility] 2/9 08/09/2016  Decreased Interest 0  Down, Depressed, Hopeless 0  PHQ - 2 Score 0    ADLs:  In your present state of health, do you have any difficulty performing the following activities: 08/09/2016 01/28/2016  Hearing? N N  Vision? N N  Difficulty concentrating or making  decisions? N N  Walking or climbing stairs? N N  Dressing or bathing? N N  Doing errands, shopping? N N  Some recent data might be hidden     Cognitive Testing  Alert? Yes  Normal Appearance?Yes  Oriented to person? Yes  Place? Yes   Time? Yes  Recall of three objects?  Yes  Can perform simple calculations? Yes  Displays appropriate judgment?Yes  Can read the correct time from a watch face?Yes  EOL planning:     Objective:   There were no vitals filed for this visit. There is no height or weight on file to calculate BMI.  General appearance: alert, no distress, WD/WN,  female HEENT: normocephalic, sclerae anicteric, TMs pearly, nares patent, no discharge or erythema, pharynx normal Oral cavity: MMM, no lesions Neck: supple, no lymphadenopathy, no thyromegaly, no masses Heart: RRR, normal S1, S2, no murmurs Lungs: CTA bilaterally, no wheezes, rhonchi, or rales Abdomen: +bs, soft, non tender, non distended, no masses, no hepatomegaly, no splenomegaly Musculoskeletal: nontender, no swelling, no obvious deformity  Extremities: no edema, no cyanosis, no clubbing Pulses: 2+ symmetric, upper and lower extremities, normal cap refill Neurological: alert, oriented x 3, CN2-12 intact, strength normal upper extremities and lower extremities, sensation normal throughout, DTRs 2+ throughout, no cerebellar signs, gait normal Psychiatric: normal affect, behavior normal, pleasant  Breast: defer Gyn: defer Rectal: defer   Medicare Attestation I have personally reviewed: The patient's medical and social history Their use of alcohol, tobacco or illicit drugs Their current medications and supplements The patient's functional ability including ADLs,fall risks, home safety risks, cognitive, and hearing and visual impairment Diet and physical activities Evidence for depression or mood disorders  The patient's weight, height, BMI, and visual acuity have been recorded in the chart.  I have made  referrals, counseling, and provided education to the patient based on review of the above and I have provided the patient with a written personalized care plan for preventive services.     Vicie Mutters, PA-C   12/08/2016

## 2016-12-15 NOTE — Progress Notes (Signed)
Patient ID: Stacy Wallace, female   DOB: 03-Feb-1945, 71 y.o.   MRN: [id number]  MEDICARE ANNUAL WELLNESS VISIT AND FOLLOW UP  Assessment:   Essential hypertension - continue medications, DASH diet, exercise and monitor at home. Call if greater than 130/80.  -     CBC with Differential/Platelet -     BASIC METABOLIC PANEL WITH GFR -     Hepatic function panel -     TSH  Hypothyroidism, unspecified type Hypothyroidism-check TSH level, continue medications the same, reminded to take on an empty stomach 30-27mins before food.  -     TSH  Mixed hyperlipidemia -continue medications, check lipids, decrease fatty foods, increase activity.  -     Lipid panel  Prediabetes Discussed disease progression and risks Discussed diet/exercise, weight management and risk modification  Medication management -     Magnesium  Vitamin D deficiency -     VITAMIN D 25 Hydroxy (Vit-D Deficiency, Fractures)  Insomnia, unspecified type Insomnia- good sleep hygiene discussed, increase day time activity, try melatonin or benadryl if this does not help we will call in sleep medication.   DDD (degenerative disc disease), lumbar Suggest getting a pool/exercise  Gastroesophageal reflux disease, esophagitis presence not specified Continue PPI/H2 blocker, diet discussed  BMI 21.0-21.9, adult  Memory changes Had low B12 last visit in April, not on B12, will start sublingual and we will check other labs, no red glaf symptoms on exam however if any changes or if not help after 1 month with the B12 will get MRI and will start on wellbutrin for possible psuedodementia.  Taper off the estradiol -     Sedimentation rate -     RPR -     Ferritin -     B. burgdorfi antibodies -     HIV antibody (with reflex)  Chronic left-sided low back pain without sciatica Get xray, stretches.   Encounter for Medicare annual wellness exam Colonoscopy- patient declines a colonoscopy even though the risks and benefits were  discussed at length. Colon cancer is 3rd most diagnosed cancer and 2nd leading cause of death in both men and women 24 years of age and older. Patient understands the risk of cancer and death with declining the test however they are willing to do cologuard screening instead. They understand that this is not as sensitive or specific as a colonoscopy and they are still recommended to get a colonoscopy. The cologuard will be sent out to their house.   Over 30 minutes of exam, counseling, chart review, and critical decision making was performed  Future Appointments  Date Time Provider [place]  03/16/2017  2:00 PM Unk Pinto, MD [facility] None  09/02/2017  3:00 PM Unk Pinto, MD [facility] None    Plan:   During the course of the visit the patient was educated and counseled about appropriate screening and preventive services including:    Pneumococcal vaccine   Influenza vaccine  Td vaccine  Prevnar 13  Screening electrocardiogram  Screening mammography  Bone densitometry screening  Colorectal cancer screening  Diabetes screening  Glaucoma screening  Nutrition counseling   Advanced directives: given info/requested copies   Subjective:   Stacy Wallace is a 71 y.o. female who presents for Medicare Annual Wellness Visit and 3 month follow up on hypertension, prediabetes, hyperlipidemia, vitamin D def.   She complains of left hip pain since fall in 2015, has numbness all the way down left leg, some SI pain. No pain down that  leg. Worse with walking for a long time and sitting for a long time. Heating pad dose plan. She is on mobic daily. Not on prilosec daily.   Husband is concerned that has dementia/confusion. She had forgot her appointment last week. She states she has been more forgetful last 2 years. Won't know what day it is occ. Will drive some but has gotten lost before, especially if she does not follow normal route.  Some blurry vision, no double  vision, no dizziness, imbalance, trouble swallowing. She states she has not been able to smell for last year. She is alone a lot at the house, feels lonely, denies depression.   Her blood pressure has been controlled at home has been elevated at time, she is on atenolol 100mg  and benazepril 20mg  at night, lasix 80 mg 1/2 pill, today their BP is BP: 120/76 She does not workout. She denies chest pain, shortness of breath, dizziness.  She is on cholesterol medication and denies myalgias. Her cholesterol is at goal. The cholesterol last visit was:   Lab Results  Component Value Date   CHOL 193 08/05/2016   HDL 54 08/05/2016   LDLCALC 90 08/05/2016   TRIG 247 (H) 08/05/2016   CHOLHDL 3.6 08/05/2016   Patient reports that she has not been monitoring her diet.  She has a history of prediabetes which is diet controlled.  She has not symptoms of polydipsia, polyphagia, or polyuria.   Lab Results  Component Value Date   HGBA1C 5.4 08/05/2016   Last GFR Lab Results  Component Value Date   GFRNONAA 35 (L) 08/05/2016   Patient is on Vitamin D supplement. Lab Results  Component Value Date   VD25OH 79 08/05/2016     BMI is Body mass index is 21.43 kg/m., she is working on diet and exercise. Wt Readings from Last 3 Encounters:  12/16/16 113 lb 6.4 oz (51.4 kg)  04/30/16 117 lb 3.2 oz (53.2 kg)  01/28/16 115 lb 3.2 oz (52.3 kg)    Medication Review Current Outpatient Medications on File Prior to Visit  Medication Sig Dispense Refill  . ALPRAZolam (XANAX) 1 MG tablet Take 1/2 to 1 tablet at hour of sleep ONLY as needed 90 tablet 1  . atenolol (TENORMIN) 100 MG tablet TAKE 1 TABLET EVERY DAY 90 tablet 1  . benazepril (LOTENSIN) 40 MG tablet Take tablet at night for BP 90 tablet 3  . BIOTIN PO Take 1 capsule by mouth daily.    . Cholecalciferol (D 5000) 5000 UNITS capsule Take 5,000 Units by mouth daily.    Marland Kitchen estradiol (ESTRACE) 1 MG tablet Take 1 tablet (1 mg total) by mouth daily. 90 tablet  3  . furosemide (LASIX) 80 MG tablet TAKE 1 TABLET EVERY DAY 90 tablet 4  . levothyroxine (SYNTHROID, LEVOTHROID) 50 MCG tablet TAKE 1 TABLET EVERY DAY BEFORE BREAKFAST 90 tablet 1  . Magnesium 500 MG TABS Take 500 mg by mouth daily.    . meloxicam (MOBIC) 15 MG tablet TAKE 1 TABLET EVERY DAY AS NEEDED FOR PAIN 90 tablet 1  . omeprazole (PRILOSEC) 20 MG capsule TAKE 1 CAPSULE EVERY DAY 90 capsule 3  . rosuvastatin (CRESTOR) 40 MG tablet TAKE 1 TABLET EVERY DAY OR AS DIRECTED FOR CHOLESTEROL 90 tablet 1   No current facility-administered medications on file prior to visit.     Allergies: Allergies  Allergen Reactions  . Lopid [Gemfibrozil]     headache  . Morphine And Related Other (See Comments)  Reaction: unknown   . Nickel Other (See Comments)    Reaction: unknown   . Latex Rash    Current Problems (verified) has Hyperlipidemia; Hypertension; Hypothyroidism; Prediabetes; Vitamin D deficiency; GERD (gastroesophageal reflux disease); Medication management; Insomnia; and DDD (degenerative disc disease), lumbar on their problem list.  Screening Tests Immunization History  Administered Date(s) Administered  . Influenza, High Dose Seasonal PF 09/19/2013, 10/16/2014, 10/08/2015  . Influenza-Unspecified 09/15/2012, 11/05/2016  . Pneumococcal Conjugate-13 08/08/2015  . Pneumococcal-Unspecified 03/18/2010  . Td 11/09/2006    Preventative care: Last colonoscopy: 2008 normal, willing to do cologuard Last mammogram: 07/11/15 will get 2019 DEXA: 2013 Left hip Xray 03/2013 MR spine 09/2015  Prior vaccinations: TD or Tdap: 2008 declines  Influenza: 2018 Pneumococcal: 2012 Prevnar13: 2017 Shingles/Zostavax: Declined   Names of Other Physician/Practitioners you currently use: 1. [facility]- here for primary care 2. Dr. Katy Fitch, eye doctor, 4-5 years ago 3. Sh[facility], dentist, last visit remote Patient Care Team: Unk Pinto, MD as PCP -  General (Internal Medicine)  Surgical: She  has a past surgical history that includes Tubal ligation; Eye surgery; Abdominal hysterectomy; Carpal tunnel release (Bilateral, 2001); and Nasal septum surgery. Family Her family history includes Alcohol abuse in her father; Depression in her sister; Heart disease in her brother, father, and sister; Hyperlipidemia in her father and mother; Hypertension in her father and mother; Stroke in her sister. Social history  She reports that she quit smoking about 28 years ago. she has never used smokeless tobacco. She reports that she drinks alcohol. Her drug history is not on file.  MEDICARE WELLNESS OBJECTIVES: Physical activity: Current Exercise Habits: The patient does not participate in regular exercise at present Cardiac risk factors: Cardiac Risk Factors include: dyslipidemia;hypertension;sedentary lifestyle Depression/mood screen:   Depression screen [facility] 2/9 12/16/2016  Decreased Interest 0  Down, Depressed, Hopeless 0  PHQ - 2 Score 0    ADLs:  In your present state of health, do you have any difficulty performing the following activities: 12/16/2016 08/09/2016  Hearing? N N  Vision? N N  Difficulty concentrating or making decisions? Y N  Walking or climbing stairs? N N  Dressing or bathing? N N  Doing errands, shopping? N N  Some recent data might be hidden     Cognitive Testing  Alert? Yes  Normal Appearance?Yes  Oriented to person? Yes  Place? Yes   Time? No got month wrong  Recall of three objects?  2/3  Can perform simple calculations? Yes  Displays appropriate judgment?Yes  Can read the correct time from a watch face?Yes  EOL planning: Does Patient Have a Medical Advance Directive?: Yes Type of Advance Directive: Healthcare Power of Attorney, Living will Copy of [place] in Chart?: No - copy requested   Objective:   Today's Vitals   12/16/16 1351  BP: 120/76  Pulse: 72  Resp: 14  Temp: (!) 97.5 F  (36.4 C)  SpO2: 98%  Weight: 113 lb 6.4 oz (51.4 kg)  Height: 5\' 1"  (1.549 m)  PainSc: 3   PainLoc: Hip   Body mass index is 21.43 kg/m.  General appearance: alert, no distress, WD/WN,  female HEENT: normocephalic, sclerae anicteric, TMs pearly, nares patent, no discharge or erythema, pharynx normal Oral cavity: MMM, no lesions Neck: supple, no lymphadenopathy, no thyromegaly, no masses Heart: RRR, normal S1, S2, no murmurs Lungs: CTA bilaterally, no wheezes, rhonchi, or rales Abdomen: +bs, soft, non tender, non distended, no masses, no hepatomegaly,  no splenomegaly Musculoskeletal: nontender, no swelling, no obvious deformity, no SI tenderness, neg straight leg.  Extremities: no edema, no cyanosis, no clubbing Pulses: 2+ symmetric, upper and lower extremities, normal cap refill Neurological: alert, oriented x 3, CN2-12 intact, strength normal upper extremities and lower extremities, sensation normal throughout, DTRs 2+ throughout, no cerebellar signs, normal finger to nose, some tongue fasiculations, gait normal Psychiatric: normal affect, behavior normal, pleasant  Breast: defer Gyn: defer Rectal: defer   Medicare Attestation I have personally reviewed: The patient's medical and social history Their use of alcohol, tobacco or illicit drugs Their current medications and supplements The patient's functional ability including ADLs,fall risks, home safety risks, cognitive, and hearing and visual impairment Diet and physical activities Evidence for depression or mood disorders  The patient's weight, height, BMI, and visual acuity have been recorded in the chart.  I have made referrals, counseling, and provided education to the patient based on review of the above and I have provided the patient with a written personalized care plan for preventive services.     Vicie Mutters, PA-C   12/16/2016

## 2016-12-16 ENCOUNTER — Encounter: Payer: Self-pay | Admitting: Physician Assistant

## 2016-12-16 ENCOUNTER — Ambulatory Visit: Payer: Medicare HMO | Admitting: Physician Assistant

## 2016-12-16 VITALS — BP 120/76 | HR 72 | Temp 97.5°F | Resp 14 | Ht 61.0 in | Wt 113.4 lb

## 2016-12-16 DIAGNOSIS — Z79899 Other long term (current) drug therapy: Secondary | ICD-10-CM

## 2016-12-16 DIAGNOSIS — Z0001 Encounter for general adult medical examination with abnormal findings: Secondary | ICD-10-CM | POA: Diagnosis not present

## 2016-12-16 DIAGNOSIS — M545 Low back pain: Secondary | ICD-10-CM | POA: Diagnosis not present

## 2016-12-16 DIAGNOSIS — R6889 Other general symptoms and signs: Secondary | ICD-10-CM | POA: Diagnosis not present

## 2016-12-16 DIAGNOSIS — G8929 Other chronic pain: Secondary | ICD-10-CM

## 2016-12-16 DIAGNOSIS — Z6821 Body mass index (BMI) 21.0-21.9, adult: Secondary | ICD-10-CM

## 2016-12-16 DIAGNOSIS — E782 Mixed hyperlipidemia: Secondary | ICD-10-CM

## 2016-12-16 DIAGNOSIS — K219 Gastro-esophageal reflux disease without esophagitis: Secondary | ICD-10-CM | POA: Diagnosis not present

## 2016-12-16 DIAGNOSIS — E559 Vitamin D deficiency, unspecified: Secondary | ICD-10-CM | POA: Diagnosis not present

## 2016-12-16 DIAGNOSIS — G4700 Insomnia, unspecified: Secondary | ICD-10-CM

## 2016-12-16 DIAGNOSIS — M5136 Other intervertebral disc degeneration, lumbar region: Secondary | ICD-10-CM | POA: Diagnosis not present

## 2016-12-16 DIAGNOSIS — R413 Other amnesia: Secondary | ICD-10-CM

## 2016-12-16 DIAGNOSIS — I10 Essential (primary) hypertension: Secondary | ICD-10-CM

## 2016-12-16 DIAGNOSIS — D649 Anemia, unspecified: Secondary | ICD-10-CM | POA: Diagnosis not present

## 2016-12-16 DIAGNOSIS — E039 Hypothyroidism, unspecified: Secondary | ICD-10-CM | POA: Diagnosis not present

## 2016-12-16 DIAGNOSIS — R7303 Prediabetes: Secondary | ICD-10-CM | POA: Diagnosis not present

## 2016-12-16 DIAGNOSIS — Z0000 Encounter for general adult medical examination without abnormal findings: Secondary | ICD-10-CM

## 2016-12-16 NOTE — Patient Instructions (Addendum)
PLEASE START TO TAPER OFF THE ESTRADIOL DO 1 PILL EVERY OTHER DAY FOR 1 MONTH THEN ONE PILL 2 DAYS A WEEK FOR A MONTH THEN STOP THE PILL  THIS MEDICATION PUTS YOU GREAT RISK FOR HEART ATTACK AND STROKE.   CHECK TO SEE IF YOU ARE ON THE BENZAPRIL FOR YOUR BP WHAT DOSE ARE YOU ON?  GET ON SUBLINGUAL B12, IF NOT BETTER OR ANY NEW SYMPTOMS WILL GET MRI AND TRY A DEPRESSION MEDICATION  B12 is low end of normal, add sublingual B12. The sublingual is better than the pills because likely you are not absorbing in your intestines well so the sublingual gets absorbed through your mouth and ensures you get the amount you need. Will help with energy, memory/concentration, decrease nerve pain, and help with weight loss. B12 is water soluble vitamin so you can not over dose on it, and anything you do not use will be sent out in your urine.    Vitamin B12 Deficiency Vitamin B12 deficiency occurs when the body does not have enough vitamin B12. Vitamin B12 is an important vitamin. The body needs vitamin B12:  To make red blood cells.  To make DNA. This is the genetic material inside cells.  To help the nerves work properly so they can carry messages from the brain to the body.  Vitamin B12 deficiency can cause various health problems, such as a low red blood cell count (anemia) or nerve damage. What are the causes? This condition may be caused by:  Not eating enough foods that contain vitamin B12.  Not having enough stomach acid and digestive fluids to properly absorb vitamin B12 from the food that you eat.  Certain digestive system diseases that make it hard to absorb vitamin B12. These diseases include Crohn disease, chronic pancreatitis, and cystic fibrosis.  Pernicious anemia. This is a condition in which the body does not make enough of a protein (intrinsic factor), resulting in too few red blood cells.  Having a surgery in which part of the stomach or small intestine is removed.  Taking  certain medicines that make it hard for the body to absorb vitamin B12. These medicines include: ? Heartburn medicine (antacids and proton pump inhibitors). ? An antibiotic medicine called neomycin. ? Some medicines that are used to treat diabetes, tuberculosis, gout, or high cholesterol.  What increases the risk? The following factors may make you more likely to develop a B12 deficiency:  Being older than age 64.  Eating a vegetarian or vegan diet, especially while you are pregnant.  Eating a poor diet while you are pregnant.  Taking certain drugs.  Having alcoholism.  What are the signs or symptoms? In some cases, there are no symptoms of this condition. If the condition leads to anemia or nerve damage, various symptoms can occur, such as:  Weakness.  Fatigue.  Loss of appetite.  Weight loss.  Numbness or tingling in your hands and feet.  Redness and burning of the tongue.  Confusion or memory problems.  Depression.  Sensory problems, such as color blindness, ringing in the ears, or loss of taste.  Diarrhea or constipation.  Trouble walking.  If anemia is severe, symptoms can include:  Shortness of breath.  Dizziness.  Rapid heart rate (tachycardia).  How is this diagnosed? This condition may be diagnosed with a blood test to measure the level of vitamin B12 in your blood. You may have other tests to help find the cause of your vitamin B12 deficiency. These tests  may include:  A complete blood count (CBC). This is a group of tests that measure certain characteristics of blood cells.  A blood test to measure intrinsic factor.  An endoscopy. In this procedure, a thin tube with a camera on the end is used to look into your stomach or intestines.  How is this treated? Treatment for this condition depends on the cause. Common treatment options include:  Changing your eating and drinking habits, such as: ? Eating more foods that contain vitamin  B12. ? Drinking less alcohol or no alcohol.  Taking vitamin B12 supplements. Your health care provider will tell you which dosage is best for you.  Getting vitamin B12 injections.  Follow these instructions at home:  Take supplements only as told by your health care provider. Follow the directions carefully.  Get any injections that are prescribed by your health care provider.  Do not miss your appointments.  Eat lots of healthy foods that contain vitamin B12. Ask your health care provider if you should work with a dietitian. Foods that contain vitamin B12 include: ? Meat. ? Meat from birds (poultry). ? Fish. ? Eggs. ? Cereal and dairy products that are fortified. This means that vitamin B12 has been added to the food. Check the label on the package to see if the food is fortified.  Do not abuse alcohol.  Keep all follow-up visits as told by your health care provider. This is important. Contact a health care provider if:  Your symptoms come back. Get help right away if:  You develop shortness of breath.  You have chest pain.  You become dizzy or you lose consciousness. This information is not intended to replace advice given to you by your health care provider. Make sure you discuss any questions you have with your health care provider. Document Released: 03/16/2011 Document Revised: 06/05/2015 Document Reviewed: 05/09/2014 Elsevier Interactive Patient Education  2018 [place] is an easy to use noninvasive colon cancer screening test based on the latest advances in stool DNA science.   Colon cancer is 3rd most diagnosed cancer and 2nd leading cause of death in both men and women 96 years of age and older despite being one of the most preventable and treatable cancers if found early.  4 of out 5 people diagnosed with colon cancer have NO prior family history.  When caught EARLY 90% of colon cancer is curable.   You have agreed to do a Cologuard screening  and have declined a colonoscopy in spite of being explained the risks and benefits of the colonoscopy in detail, including cancer and death. Please understand that this is test not as sensitive or specific as a colonoscopy and you are still recommended to get a colonoscopy.   If you are NOT medicare please call your insurance company and give them these items to see if they will cover it: 1) CPT code, [id number] 2) Provider is Probation officer 3) [employer] NPI [id number] 4) [place] Tax ID [phone]  Out-of-pocket cost for Cologuard can range from $0 - $649 so please call  You will receive a short call from [place] support center at [employer], when you receive a call they will say they are from [place],  to confirm your mailing address and give you more information.  When they calll you, it will appear on the caller ID as "[employer]" or in some cases only this number will appear, [phone].   [facility]chandiser, retail  Laboratories will ship your collection kit directly to you. You will collect a single stool sample in the privacy of your own home, no special preparation required. You will return the kit via [facility] pre-paid shipping or pick-up, in the same box it arrived in. Then I will contact you to discuss your results after I receive them from the laboratory.   If you have any questions or concerns, Cologuard Customer Support Specialist are available 24 hours a day, 7 days a week at [phone] or go to [email or web address].    Memory Compensation Strategies  1. Use "WARM" strategy.  W= write it down  A= associate it  R= repeat it  M= make a mental note  2.   You can keep a Social worker.  Use a 3-ring notebook with sections for the following: calendar, important names and phone numbers,  medications, doctors' names/phone numbers, lists/reminders, and a section to journal what you did  each day.   3.    Use a calendar to write  appointments down.  4.    Write yourself a schedule for the day.  This can be placed on the calendar or in a separate section of the Memory Notebook.  Keeping a  regular schedule can help memory.  5.    Use medication organizer with sections for each day or morning/evening pills.  You may need help loading it  6.    Keep a basket, or pegboard by the door.  Place items that you need to take out with you in the basket or on the pegboard.  You may also want to  include a message board for reminders.  7.    Use sticky notes.  Place sticky notes with reminders in a place where the task is performed.  For example: " turn off the  stove" placed by the stove, "lock the door" placed on the door at eye level, " take your medications" on  the bathroom mirror or by the place where you normally take your medications.  8.    Use alarms/timers.  Use while cooking to remind yourself to check on food or as a reminder to take your medicine, or as a  reminder to make a call, or as a reminder to perform another task, etc.

## 2016-12-17 ENCOUNTER — Other Ambulatory Visit: Payer: Self-pay | Admitting: Physician Assistant

## 2016-12-17 ENCOUNTER — Other Ambulatory Visit: Payer: Self-pay

## 2016-12-17 MED ORDER — DOXYCYCLINE HYCLATE 100 MG PO CAPS: ORAL_CAPSULE | ORAL | 0 refills | Status: DC

## 2016-12-17 NOTE — Progress Notes (Signed)
Pt aware of lab results & voiced understanding of those results. Waiting a return call from the husband in order to inform him of the results.

## 2016-12-18 LAB — CBC WITH DIFFERENTIAL/PLATELET
BASOS PCT: 0.4 %
Basophils Absolute: 28 cells/uL (ref 0–200)
Eosinophils Absolute: 77 cells/uL (ref 15–500)
Eosinophils Relative: 1.1 %
HEMATOCRIT: 42.9 % (ref 35.0–45.0)
HEMOGLOBIN: 14.3 g/dL (ref 11.7–15.5)
LYMPHS ABS: 2751 {cells}/uL (ref 850–3900)
MCH: 30.8 pg (ref 27.0–33.0)
MCHC: 33.3 g/dL (ref 32.0–36.0)
MCV: 92.3 fL (ref 80.0–100.0)
MPV: 10.8 fL (ref 7.5–12.5)
Monocytes Relative: 5.6 %
NEUTROS ABS: 3752 {cells}/uL (ref 1500–7800)
NEUTROS PCT: 53.6 %
Platelets: 155 10*3/uL (ref 140–400)
RBC: 4.65 10*6/uL (ref 3.80–5.10)
RDW: 12.6 % (ref 11.0–15.0)
Total Lymphocyte: 39.3 %
WBC: 7 10*3/uL (ref 3.8–10.8)
WBCMIX: 392 {cells}/uL (ref 200–950)

## 2016-12-18 LAB — HEPATIC FUNCTION PANEL
AG Ratio: 1.6 (calc) (ref 1.0–2.5)
ALKALINE PHOSPHATASE (APISO): 57 U/L (ref 33–130)
ALT: 12 U/L (ref 6–29)
AST: 26 U/L (ref 10–35)
Albumin: 5 g/dL (ref 3.6–5.1)
BILIRUBIN DIRECT: 0.1 mg/dL (ref 0.0–0.2)
BILIRUBIN INDIRECT: 0.5 mg/dL (ref 0.2–1.2)
Globulin: 3.1 g/dL (calc) (ref 1.9–3.7)
Total Bilirubin: 0.6 mg/dL (ref 0.2–1.2)
Total Protein: 8.1 g/dL (ref 6.1–8.1)

## 2016-12-18 LAB — B. BURGDORFI ANTIBODIES BY WB
B burgdorferi IgG Abs (IB): NEGATIVE
B burgdorferi IgM Abs (IB): NEGATIVE
LYME DISEASE 23 KD IGG: NONREACTIVE
LYME DISEASE 23 KD IGM: NONREACTIVE
LYME DISEASE 39 KD IGM: NONREACTIVE
LYME DISEASE 41 KD IGG: REACTIVE — AB
LYME DISEASE 41 KD IGM: NONREACTIVE
LYME DISEASE 45 KD IGG: NONREACTIVE
LYME DISEASE 66 KD IGG: NONREACTIVE
LYME DISEASE 93 KD IGG: NONREACTIVE
Lyme Disease 18 kD IgG: NONREACTIVE
Lyme Disease 28 kD IgG: NONREACTIVE
Lyme Disease 30 kD IgG: NONREACTIVE
Lyme Disease 39 kD IgG: NONREACTIVE
Lyme Disease 58 kD IgG: NONREACTIVE

## 2016-12-18 LAB — BASIC METABOLIC PANEL WITH GFR
BUN / CREAT RATIO: 13 (calc) (ref 6–22)
BUN: 17 mg/dL (ref 7–25)
CALCIUM: 10.2 mg/dL (ref 8.6–10.4)
CHLORIDE: 100 mmol/L (ref 98–110)
CO2: 30 mmol/L (ref 20–32)
Creat: 1.33 mg/dL — ABNORMAL HIGH (ref 0.60–0.93)
GFR, EST AFRICAN AMERICAN: 46 mL/min/{1.73_m2} — AB (ref >=60)
GFR, Est Non African American: 40 mL/min/{1.73_m2} — ABNORMAL LOW (ref >=60)
Glucose, Bld: 103 mg/dL — ABNORMAL HIGH (ref 65–99)
POTASSIUM: 4 mmol/L (ref 3.5–5.3)
SODIUM: 143 mmol/L (ref 135–146)

## 2016-12-18 LAB — HIV ANTIBODY (ROUTINE TESTING W REFLEX): HIV: NONREACTIVE

## 2016-12-18 LAB — LIPID PANEL
Cholesterol: 210 mg/dL — ABNORMAL HIGH (ref <200)
HDL: 85 mg/dL (ref >50)
LDL CHOLESTEROL (CALC): 103 mg/dL — AB
Non-HDL Cholesterol (Calc): 125 mg/dL (calc) (ref <130)
Total CHOL/HDL Ratio: 2.5 (calc) (ref <5.0)
Triglycerides: 123 mg/dL (ref <150)

## 2016-12-18 LAB — TSH: TSH: 1.72 m[IU]/L (ref 0.40–4.50)

## 2016-12-18 LAB — FERRITIN: Ferritin: 85 ng/mL (ref 20–288)

## 2016-12-18 LAB — RPR: RPR Ser Ql: NONREACTIVE

## 2016-12-18 LAB — SEDIMENTATION RATE: SED RATE: 6 mm/h (ref 0–30)

## 2016-12-18 LAB — VITAMIN D 25 HYDROXY (VIT D DEFICIENCY, FRACTURES): VIT D 25 HYDROXY: 85 ng/mL (ref 30–100)

## 2016-12-18 LAB — LYME AB SCREEN %: Lyme AB Screen: 0.94 index — ABNORMAL HIGH

## 2016-12-18 LAB — MAGNESIUM: MAGNESIUM: 2.1 mg/dL (ref 1.5–2.5)

## 2016-12-22 ENCOUNTER — Encounter: Payer: Self-pay | Admitting: Physician Assistant

## 2016-12-22 ENCOUNTER — Ambulatory Visit (HOSPITAL_COMMUNITY)
Admission: RE | Admit: 2016-12-22 | Discharge: 2016-12-22 | Disposition: A | Discharge status: Home / Self Care | Payer: Medicare HMO | Source: Ambulatory Visit | Attending: Physician Assistant | Admitting: Physician Assistant

## 2016-12-22 DIAGNOSIS — M545 Low back pain, unspecified: Secondary | ICD-10-CM

## 2016-12-22 DIAGNOSIS — M5137 Other intervertebral disc degeneration, lumbosacral region: Secondary | ICD-10-CM | POA: Insufficient documentation

## 2016-12-22 DIAGNOSIS — M25552 Pain in left hip: Secondary | ICD-10-CM | POA: Insufficient documentation

## 2016-12-22 DIAGNOSIS — G8929 Other chronic pain: Secondary | ICD-10-CM | POA: Insufficient documentation

## 2016-12-22 DIAGNOSIS — I700 Atherosclerosis of aorta: Secondary | ICD-10-CM | POA: Insufficient documentation

## 2016-12-22 NOTE — Progress Notes (Signed)
Pt aware of lab results & voiced understanding of those results.

## 2016-12-31 ENCOUNTER — Other Ambulatory Visit: Payer: Self-pay | Admitting: Internal Medicine

## 2017-02-01 ENCOUNTER — Other Ambulatory Visit: Payer: Self-pay | Admitting: Internal Medicine

## 2017-02-09 ENCOUNTER — Telehealth: Payer: Self-pay | Admitting: Physician Assistant

## 2017-02-09 ENCOUNTER — Other Ambulatory Visit: Payer: Self-pay | Admitting: Physician Assistant

## 2017-02-09 MED ORDER — BENZONATATE 100 MG PO CAPS: 100.0000 mg | ORAL_CAPSULE | Freq: Three times a day (TID) | ORAL | 0 refills | Status: DC | PRN

## 2017-02-09 MED ORDER — AZITHROMYCIN 250 MG PO TABS: ORAL_TABLET | ORAL | 1 refills | Status: AC

## 2017-02-09 MED ORDER — PROMETHAZINE-DM 6.25-15 MG/5ML PO SYRP: 5.0000 mL | ORAL_SOLUTION | Freq: Four times a day (QID) | ORAL | 1 refills | Status: DC | PRN

## 2017-02-09 MED ORDER — PREDNISONE 20 MG PO TABS: ORAL_TABLET | ORAL | 0 refills | Status: DC

## 2017-02-09 NOTE — Telephone Encounter (Signed)
Patient aware.

## 2017-02-09 NOTE — Telephone Encounter (Signed)
72 y.o. female calls with 3 weeks of URI symptoms. She is on nothing additional.   Symptoms include fatigue, sweats, congestion, sinus drainage, green productive cough.   Problem list has Hyperlipidemia; Hypertension; Hypothyroidism; Prediabetes; Vitamin D deficiency; GERD (gastroesophageal reflux disease); Medication management; Insomnia; DDD (degenerative disc disease), lumbar; and Aortic atherosclerosis ([place]) on their problem list.  Medications Current Outpatient Medications on File Prior to Visit  Medication Sig  . ALPRAZolam (XANAX) 1 MG tablet Take 1/2 to 1 tablet at hour of sleep ONLY as needed  . atenolol (TENORMIN) 100 MG tablet TAKE 1 TABLET EVERY DAY  . benazepril (LOTENSIN) 40 MG tablet Take tablet at night for BP  . BIOTIN PO Take 1 capsule by mouth daily.  . Cholecalciferol (D 5000) 5000 UNITS capsule Take 5,000 Units by mouth daily.  Marland Kitchen doxycycline (VIBRAMYCIN) 100 MG capsule Take 1 capsule twice daily with food  . estradiol (ESTRACE) 1 MG tablet Take 1 tablet (1 mg total) by mouth daily.  . furosemide (LASIX) 80 MG tablet TAKE 1 TABLET EVERY DAY  . levothyroxine (SYNTHROID, LEVOTHROID) 50 MCG tablet TAKE 1 TABLET EVERY DAY BEFORE BREAKFAST  . Magnesium 500 MG TABS Take 500 mg by mouth daily.  . meloxicam (MOBIC) 15 MG tablet TAKE 1 TABLET EVERY DAY AS NEEDED FOR PAIN  . omeprazole (PRILOSEC) 20 MG capsule TAKE 1 CAPSULE EVERY DAY  . rosuvastatin (CRESTOR) 40 MG tablet TAKE 1 TABLET EVERY DAY OR AS DIRECTED FOR CHOLESTEROL   No current facility-administered medications on file prior to visit.     Allergies  Allergen Reactions  . Lopid [Gemfibrozil]     headache  . Morphine And Related Other (See Comments)    Reaction: unknown   . Nickel Other (See Comments)    Reaction: unknown   . Latex Rash     I have prescribed zpak and prednisone with a cough syrup.   You may use an oral decongestant such as Mucinex D or if you have glaucoma or high blood pressure use plain  Mucinex.   Saline nasal sprays help and can safely be used as often as needed for congestion.  Make sure you are on an allergy pill.   If you develop worsening sinus pain, fever or notice severe headache and vision changes, or if symptoms are not better after completion of antibiotic, please schedule an appointment with a health care provider. If you are getting worse please go to the ER.

## 2017-02-10 ENCOUNTER — Other Ambulatory Visit: Payer: Self-pay | Admitting: Internal Medicine

## 2017-02-10 MED ORDER — GUAIFENESIN-DM 100-10 MG/5ML PO SYRP: 5.0000 mL | ORAL_SOLUTION | ORAL | 1 refills | Status: DC | PRN

## 2017-02-15 ENCOUNTER — Other Ambulatory Visit: Payer: Self-pay | Admitting: Internal Medicine

## 2017-03-08 ENCOUNTER — Ambulatory Visit: Payer: Self-pay | Admitting: Internal Medicine

## 2017-03-16 ENCOUNTER — Ambulatory Visit (INDEPENDENT_AMBULATORY_CARE_PROVIDER_SITE_OTHER): Payer: Medicare HMO | Admitting: Internal Medicine

## 2017-03-16 ENCOUNTER — Encounter: Payer: Self-pay | Admitting: Internal Medicine

## 2017-03-16 VITALS — BP 140/76 | HR 72 | Temp 97.8°F | Resp 16 | Ht 61.75 in | Wt 111.8 lb

## 2017-03-16 DIAGNOSIS — R7309 Other abnormal glucose: Secondary | ICD-10-CM | POA: Diagnosis not present

## 2017-03-16 DIAGNOSIS — K219 Gastro-esophageal reflux disease without esophagitis: Secondary | ICD-10-CM | POA: Diagnosis not present

## 2017-03-16 DIAGNOSIS — E782 Mixed hyperlipidemia: Secondary | ICD-10-CM

## 2017-03-16 DIAGNOSIS — E538 Deficiency of other specified B group vitamins: Secondary | ICD-10-CM | POA: Diagnosis not present

## 2017-03-16 DIAGNOSIS — Z79899 Other long term (current) drug therapy: Secondary | ICD-10-CM | POA: Diagnosis not present

## 2017-03-16 DIAGNOSIS — I10 Essential (primary) hypertension: Secondary | ICD-10-CM | POA: Diagnosis not present

## 2017-03-16 DIAGNOSIS — R413 Other amnesia: Secondary | ICD-10-CM | POA: Diagnosis not present

## 2017-03-16 DIAGNOSIS — E559 Vitamin D deficiency, unspecified: Secondary | ICD-10-CM | POA: Diagnosis not present

## 2017-03-16 DIAGNOSIS — R7303 Prediabetes: Secondary | ICD-10-CM | POA: Diagnosis not present

## 2017-03-16 DIAGNOSIS — D649 Anemia, unspecified: Secondary | ICD-10-CM

## 2017-03-16 NOTE — Progress Notes (Signed)
This very nice 72 y.o. MWF presents for 6 month follow up with HTN, HLD, Hypothyroidism, Pre-Diabetes and Vitamin D Deficiency.     Today, she's accompanied by her daughter for concern re: her mother's memory loss.  Apparently short term memory has been progressively worse & she gotten lost in the car on a few occasions. Extensive labs were done in Dec included negative Lyme panel, RPR and ESR. BMET was consistent with CKD3 (GFR 40) . Vit B12 in Apr 2018 was low at 218 and patient has not supplemented B12. There's been no hx/o head trauma or k/o TIA's/CVA's. Patient seems indifferent to her memory deficits. There has been a weight loss of  13# from 128# in Nov 2015 to 114# in Dec 2016 and stable since then. Patient reports lack of taste for foods or desire to eat.      Patient is treated for HTN (1999) & BP has been controlled at home. Today's BP is at goal - 140/76. Patient has had no complaints of any cardiac type chest pain, palpitations, dyspnea / orthopnea / PND, dizziness, claudication, or dependent edema.     Hyperlipidemia is controlled with diet & meds. Patient denies myalgias or other med SE's. Last Lipids were  Lab Results  Component Value Date   CHOL 210 (H) 12/16/2016   HDL 85 12/16/2016   LDLCALC 103 (H) 12/16/2016   TRIG 123 12/16/2016   CHOLHDL 2.5 12/16/2016      Also, the patient has history of PreDiabetes (A1c5.8%/2011 and 5.9%/2014) and has had no symptoms of reactive hypoglycemia, diabetic polys, paresthesias or visual blurring.  Last A1c was Normal & at goal: Lab Results  Component Value Date   HGBA1C 5.4 08/05/2016      Patient has been on replacement Thyroid since 2014. Further, the patient also has history of Vitamin D Deficiency and supplements vitamin D without any suspected side-effects. Last vitamin D was at goal:   Lab Results  Component Value Date   VD25OH 72 12/16/2016   Current Outpatient Medications on File Prior to Visit  Medication Sig  .  ALPRAZolam (XANAX) 1 MG tablet Take 1/2 to 1 tablet at hour of sleep ONLY as needed  . atenolol (TENORMIN) 100 MG tablet TAKE 1 TABLET EVERY DAY  . benazepril (LOTENSIN) 40 MG tablet Take tablet at night for BP  . BIOTIN PO Take 1 capsule by mouth daily.  . Cholecalciferol (D 5000) 5000 UNITS capsule Take 5,000 Units by mouth daily.  Marland Kitchen estradiol (ESTRACE) 1 MG tablet Take 1 tablet (1 mg total) by mouth daily.  . furosemide (LASIX) 80 MG tablet TAKE 1 TABLET EVERY DAY  . levothyroxine (SYNTHROID, LEVOTHROID) 50 MCG tablet TAKE 1 TABLET EVERY DAY BEFORE BREAKFAST  . meloxicam (MOBIC) 15 MG tablet TAKE 1 TABLET EVERY DAY AS NEEDED FOR PAIN  . omeprazole (PRILOSEC) 20 MG capsule TAKE 1 CAPSULE EVERY DAY  . rosuvastatin (CRESTOR) 40 MG tablet TAKE 1 TABLET EVERY DAY OR AS DIRECTED FOR CHOLESTEROL  . Magnesium 500 MG TABS Take 500 mg by mouth daily.   No current facility-administered medications on file prior to visit.    Allergies  Allergen Reactions  . Lopid [Gemfibrozil]     headache  . Morphine And Related Other (See Comments)    Reaction: unknown   . Nickel Other (See Comments)    Reaction: unknown   . Latex Rash   PMHx:   Past Medical History:  Diagnosis Date  .  GERD (gastroesophageal reflux disease)   . Hyperlipidemia   . Hypertension   . Other abnormal glucose   . Unspecified hypothyroidism   . Unspecified vitamin D deficiency    Immunization History  Administered Date(s) Administered  . Influenza, High Dose Seasonal PF 09/19/2013, 10/16/2014, 10/08/2015  . Influenza-Unspecified 09/15/2012, 11/05/2016  . Pneumococcal Conjugate-13 08/08/2015  . Pneumococcal-Unspecified 03/18/2010  . Td 11/09/2006   Past Surgical History:  Procedure Laterality Date  . ABDOMINAL HYSTERECTOMY    . CARPAL TUNNEL RELEASE Bilateral 2001  . EYE SURGERY    . NASAL SEPTUM SURGERY    . TUBAL LIGATION     FHx:    Reviewed / unchanged  SHx:    Reviewed / unchanged  Systems  Review:  Constitutional: Denies fever, chills, wt changes, headaches, insomnia, fatigue, night sweats, change in appetite. Eyes: Denies redness, blurred vision, diplopia, discharge, itchy, watery eyes.  ENT: Denies discharge, congestion, post nasal drip, epistaxis, sore throat, earache, hearing loss, dental pain, tinnitus, vertigo, sinus pain, snoring.  CV: Denies chest pain, palpitations, irregular heartbeat, syncope, dyspnea, diaphoresis, orthopnea, PND, claudication or edema. Respiratory: denies cough, dyspnea, DOE, pleurisy, hoarseness, laryngitis, wheezing.  Gastrointestinal: Denies dysphagia, odynophagia, heartburn, reflux, water brash, abdominal pain or cramps, nausea, vomiting, bloating, diarrhea, constipation, hematemesis, melena, hematochezia  or hemorrhoids. Genitourinary: Denies dysuria, frequency, urgency, nocturia, hesitancy, discharge, hematuria or flank pain. Musculoskeletal: Denies arthralgias, myalgias, stiffness, jt. swelling, pain, limping or strain/sprain.  Skin: Denies pruritus, rash, hives, warts, acne, eczema or change in skin lesion(s). Neuro: No weakness, tremor, incoordination, spasms, paresthesia or pain. Endo: Denies change in weight, skin or hair change.  Heme/Lymph: No excessive bleeding, bruising or enlarged lymph nodes.  Physical Exam  BP 140/76   Pulse 72   Temp 97.8 F (36.6 C)   Resp 16   Ht 5' 1.75" (1.568 m)   Wt 111 lb 12.8 oz (50.7 kg)   BMI 20.61 kg/m   Appears  well nourished, well groomed  and in no distress.  Eyes: PERRLA, EOMs, conjunctiva no swelling or erythema. Sinuses: No frontal/maxillary tenderness ENT/Mouth: EAC's clear, TM's nl w/o erythema, bulging. Nares clear w/o erythema, swelling, exudates. Oropharynx clear without erythema or exudates. Oral hygiene is good. Tongue normal, non obstructing. Hearing intact.  Neck: Supple. Thyroid not palpable. Car 2+/2+ without bruits, nodes or JVD. Chest: Respirations nl with BS clear & equal  w/o rales, rhonchi, wheezing or stridor.  Cor: Heart sounds normal w/ regular rate and rhythm without sig. murmurs, gallops, clicks or rubs. Peripheral pulses normal and equal  without edema.  Abdomen: Soft & bowel sounds normal. Non-tender w/o guarding, rebound, hernias, masses or organomegaly.  Lymphatics: Unremarkable.  Musculoskeletal: Full ROM all peripheral extremities, joint stability, 5/5 strength and normal gait.  Skin: Warm, dry without exposed rashes, lesions or ecchymosis apparent.  Neuro: Cranial nerves intact, reflexes equal bilaterally. Sensory-motor testing grossly intact. Tendon reflexes grossly intact.  Pysch: Alert & oriented x 3.  Insight and judgement poor with affect  indifference. No ideations.  Assessment and Plan:  1. Essential hypertension  - Continue medication, monitor blood pressure at home.  - Continue DASH diet. Reminder to go to the ER if any CP,  SOB, nausea, dizziness, severe HA, changes vision/speech.  - CBC with Differential/Platelet - BASIC METABOLIC PANEL WITH GFR - Magnesium - TSH  2. Hyperlipidemia, mixed  - Continue diet/meds, exercise,& lifestyle modifications.  - Continue monitor periodic cholesterol/liver & renal functions   - Hepatic function panel - Lipid panel -  TSH  3. Abnormal glucose  - Hemoglobin A1c - Insulin, random  4. Vitamin D deficiency  - Continue diet, exercise, lifestyle modifications.  - Monitor appropriate labs. - Continue supplementation.   - VITAMIN D 25 Hydroxy   5. Prediabetes  - Hemoglobin A1c - Insulin, random  6. Gastroesophageal reflux disease  - CBC with Differential/Platelet  7. Memory loss - prob Vascular Dementia  - Ambulatory referral to Neurology  8. Vitamin B12 deficiency  - Methylmalonic acid, serum - Vitamin B12  9. Anemia  - Methylmalonic acid, serum - Vitamin B12  10. Medication management  - CBC with Differential/Platelet - BASIC METABOLIC PANEL WITH GFR - Hepatic  function panel - Magnesium - Lipid panel - TSH - Hemoglobin A1c - Insulin, random - VITAMIN D 25 Hydroxy        Discussed  regular exercise, BP monitoring, weight control to achieve/maintain BMI less than 25 and discussed med and SE's. Recommended labs to assess and monitor clinical status with further disposition pending results of labs. Over 30 minutes of exam, counseling, chart review was performed.

## 2017-03-16 NOTE — Patient Instructions (Addendum)
Dementia Dementia is the loss of two or more brain functions, such as:  Memory.  Decision making.  Behavior.  Speaking.  Thinking.  Problem solving.  There are many types of dementia. The most common type is called progressive dementia. Progressive dementia gets worse with time and it is irreversible. An example of this type of dementia is Alzheimer disease. What are the causes? This condition may be caused by:  Nerve cell damage in the brain.  Genetic mutations.  Certain medicines.  Multiple small strokes.  An infection, such as chronic meningitis.  A metabolic problem, such as vitamin B12 deficiency or thyroid disease.  Pressure on the brain, such as from a tumor or blood clot.  What are the signs or symptoms? Symptoms of this condition include:  Sudden changes in mood.  Depression.  Problems with balance.  Changes in personality.  Poor short-term memory.  Agitation.  Delusions.  Hallucinations.  Having a hard time: ? Speaking thoughts. ? Finding words. ? Solving problems. ? Doing familiar tasks. ? Understanding familiar ideas.  How is this diagnosed? This condition is diagnosed with an assessment by your health care provider. During this assessment, your health care provider will talk with you and your family, friends, or caregivers about your symptoms. A thorough medical history will be taken, and you will have a physical exam and tests. Tests may include:  Lab tests, such as blood or urine tests.  Imaging tests, such as a CT scan, PET scan, or MRI.  A lumbar puncture. This test involves removing and testing a small amount of the fluid that surrounds the brain and spinal cord.  An electroencephalogram (EEG). In this test, small metal discs are used to measure electrical activity in the brain.  Memory tests, cognitive tests, and neuropsychological tests. These tests evaluate brain function.  How is this treated? Treatment depends on the  cause of the dementia. It may involve taking medicines that may help:  To control the dementia.  To slow down the disease.  To manage symptoms.  In some cases, treating the cause of the dementia can improve symptoms, reverse symptoms, or slow down how quickly the dementia gets worse. Your health care provider can help direct you to support groups, organizations, and other health care providers who can help with decisions about your care. Follow these instructions at home: Medicine  Take over-the-counter and prescription medicines only as told by your health care provider.  Avoid taking medicines that can affect thinking, such as pain or sleeping medicines. Lifestyle   Make healthy lifestyle choices: ? Be physically active as told by your health care provider. ? Do not use any tobacco products, such as cigarettes, chewing tobacco, and e-cigarettes. If you need help quitting, ask your health care provider. ? Eat a healthy diet. ? Practice stress-management techniques when you get stressed. ? Stay social.  Drink enough fluid to keep your urine clear or pale yellow.  Make sure to get quality sleep. These tips can help you to get a good night's rest: ? Avoid napping during the day. ? Keep your sleeping area dark and cool. ? Avoid exercising during the few hours before you go to bed. ? Avoid caffeine products in the evening. General instructions  Work with your health care provider to determine what you need help with and what your safety needs are.  If you were given a bracelet that tracks your location, make sure to wear it.  Keep all follow-up visits as told by your   health care provider. This is important. Contact a health care provider if:  You have any new symptoms.  You have problems with choking or swallowing.  You have any symptoms of a different illness. Get help right away if:  You develop a fever.  You have new or worsening confusion.  You have new or  worsening sleepiness.  You have a hard time staying awake.  You or your family members become concerned for your safety. This information is not intended to replace advice given to you by your health care provider. Make sure you discuss any questions you have with your health care provider. Document Released: 06/17/2000 Document Revised: 05/02/2015 Document Reviewed: 09/19/2014 Elsevier Interactive Patient Education  2018 [employer].  ++++++++++++++++++++++++ Memory Compensation Strategies  1. Use "WARM" strategy.  W= write it down  A= associate it  R= repeat it  M= make a mental note  2.   You can keep a Social worker.  Use a 3-ring notebook with sections for the following: calendar, important names and phone numbers,  medications, doctors' names/phone numbers, lists/reminders, and a section to journal what you did  each day.   3.    Use a calendar to write appointments down.  4.    Write yourself a schedule for the day.  This can be placed on the calendar or in a separate section of the Memory Notebook.  Keeping a  regular schedule can help memory.  5.    Use medication organizer with sections for each day or morning/evening pills.  You may need help loading it  6.    Keep a basket, or pegboard by the door.  Place items that you need to take out with you in the basket or on the pegboard.  You may also want to  include a message board for reminders.  7.    Use sticky notes.  Place sticky notes with reminders in a place where the task is performed.  For example: " turn off the  stove" placed by the stove, "lock the door" placed on the door at eye level, " take your medications" on  the bathroom mirror or by the place where you normally take your medications.  8.    Use alarms/timers.  Use while cooking to remind yourself to check on food or as a reminder to take your medicine, or as a  reminder to make a call, or as a reminder to perform another task,  etc.  ++++++++++++++++++++++  Management of Memory Problems  There are some general things you can do to help manage your memory problems.  Your memory may not in fact recover, but by using techniques and strategies you will be able to manage your memory difficulties better.  1)  Establish a routine.  Try to establish and then stick to a regular routine.  By doing this, you will get used to what to expect and you will reduce the need to rely on your memory.  Also, try to do things at the same time of day, such as taking your medication or checking your calendar first thing in the morning.  Think about think that you can do as a part of a regular routine and make a list.  Then enter them into a daily planner to remind you.  This will help you establish a routine.  2)  Organize your environment.  Organize your environment so that it is uncluttered.  Decrease visual stimulation.  Place everyday items such as keys  or cell phone in the same place every day (ie.  Basket next to front door)  Use post it notes with a brief message to yourself (ie. Turn off light, lock the door)  Use labels to indicate where things go (ie. Which cupboards are for food, dishes, etc.)  Keep a notepad and pen by the telephone to take messages  3)  Memory Aids  A diary or journal/notebook/daily planner  Making a list (shopping list, chore list, to do list that needs to be done)  Using an alarm as a reminder (kitchen timer or cell phone alarm)  Using cell phone to store information (Notes, Calendar, Reminders)  Calendar/White board placed in a prominent position  Post-it notes  In order for memory aids to be useful, you need to have good habits.  It's no good remembering to make a note in your journal if you don't remember to look in it.  Try setting aside a certain time of day to look in journal.  4)  Improving mood and managing fatigue.  There may be other factors that contribute to memory difficulties.   Factors, such as anxiety, depression and tiredness can affect memory.  Regular gentle exercise can help improve your mood and give you more energy.  Simple relaxation techniques may help relieve symptoms of anxiety  Try to get back to completing activities or hobbies you enjoyed doing in the past.  Learn to pace yourself through activities to decrease fatigue.  Find out about some local support groups where you can share experiences with others.  Try and achieve 7-8 hours of sleep at night. ++++++++++++++++++++++++ Recommend Adult Low Dose Aspirin or  coated  Aspirin 81 mg daily  To reduce risk of Colon Cancer 20 %,  Skin Cancer 26 % ,  Melanoma 46%  and  Pancreatic cancer 60% +++++++++++++++++++++++++ Vitamin D goal  is between 70-100.  Please make sure that you are taking your Vitamin D as directed.  It is very important as a natural anti-inflammatory  helping hair, skin, and nails, as well as reducing stroke and heart attack risk.  It helps your bones and helps with mood. It also decreases numerous cancer risks so please take it as directed.  Low Vit D is associated with a 200-300% higher risk for CANCER  and 200-300% higher risk for HEART   ATTACK  &  STROKE.   .....................................Marland Kitchen It is also associated with higher death rate at younger ages,  autoimmune diseases like Rheumatoid arthritis, Lupus, Multiple Sclerosis.    Also many other serious conditions, like depression, Alzheimer's Dementia, infertility, muscle aches, fatigue, fibromyalgia - just to name a few. ++++++++++++++++++++ Recommend the book "The END of DIETING" by Dr Excell Seltzer  & the book "The END of DIABETES " by Dr Excell Seltzer At [email or web address] - get book & Audio CD's    Being diabetic has a  300% increased risk for heart attack, stroke, cancer, and alzheimer- type vascular dementia. It is very important that you work harder with diet by avoiding all foods that are white. Avoid white rice  (brown & wild rice is OK), white potatoes (sweetpotatoes in moderation is OK), White bread or wheat bread or anything made out of white flour like bagels, donuts, rolls, buns, biscuits, cakes, pastries, cookies, pizza crust, and pasta (made from white flour & egg whites) - vegetarian pasta or spinach or wheat pasta is OK. Multigrain breads like Arnold's or Pepperidge Farm, or multigrain sandwich thins or flatbreads.  Diet, exercise  and weight loss can reverse and cure diabetes in the early stages.  Diet, exercise and weight loss is very important in the control and prevention of complications of diabetes which affects every system in your body, ie. Brain - dementia/stroke, eyes - glaucoma/blindness, heart - heart attack/heart failure, kidneys - dialysis, stomach - gastric paralysis, intestines - malabsorption, nerves - severe painful neuritis, circulation - gangrene & loss of a leg(s), and finally cancer and Alzheimers.    I recommend avoid fried & greasy foods,  sweets/candy, white rice (brown or wild rice or Quinoa is OK), white potatoes (sweet potatoes are OK) - anything made from white flour - bagels, doughnuts, rolls, buns, biscuits,white and wheat breads, pizza crust and traditional pasta made of white flour & egg white(vegetarian pasta or spinach or wheat pasta is OK).  Multi-grain bread is OK - like multi-grain flat bread or sandwich thins. Avoid alcohol in excess. Exercise is also important.    Eat all the vegetables you want - avoid meat, especially red meat and dairy - especially cheese.  Cheese is the most concentrated form of trans-fats which is the worst thing to clog up our arteries. Veggie cheese is OK which can be found in the fresh produce section at [employer] or [employer] or [employer]  +++++++++++++++++++++ DASH Eating Plan  DASH stands for "Dietary Approaches to Stop Hypertension."   The DASH eating plan is a healthy eating plan that has been shown to reduce high blood pressure  (hypertension). Additional health benefits may include reducing the risk of type 2 diabetes mellitus, heart disease, and stroke. The DASH eating plan may also help with weight loss. WHAT DO I NEED TO KNOW ABOUT THE DASH EATING PLAN? For the DASH eating plan, you will follow these general guidelines:  Choose foods with a percent daily value for sodium of less than 5% (as listed on the food label).  Use salt-free seasonings or herbs instead of table salt or sea salt.  Check with your health care provider or pharmacist before using salt substitutes.  Eat lower-sodium products, often labeled as "lower sodium" or "no salt added."  Eat fresh foods.  Eat more vegetables, fruits, and low-fat dairy products.  Choose whole grains. Look for the word "whole" as the first word in the ingredient list.  Choose fish   Limit sweets, desserts, sugars, and sugary drinks.  Choose heart-healthy fats.  Eat veggie cheese   Eat more home-cooked food and less restaurant, buffet, and fast food.  Limit fried foods.  Cook foods using methods other than frying.  Limit canned vegetables. If you do use them, rinse them well to decrease the sodium.  When eating at a restaurant, ask that your food be prepared with less salt, or no salt if possible.                      WHAT FOODS CAN I EAT? Read Dr Fara Olden Fuhrman's books on The End of Dieting & The End of Diabetes  Grains Whole grain or whole wheat bread. Brown rice. Whole grain or whole wheat pasta. Quinoa, bulgur, and whole grain cereals. Low-sodium cereals. Corn or whole wheat flour tortillas. Whole grain cornbread. Whole grain crackers. Low-sodium crackers.  Vegetables Fresh or frozen vegetables (raw, steamed, roasted, or grilled). Low-sodium or reduced-sodium tomato and vegetable juices. Low-sodium or reduced-sodium tomato sauce and paste. Low-sodium or reduced-sodium canned vegetables.   Fruits All fresh, canned (in natural juice), or frozen  fruits.  Protein Products  All fish and seafood.  Dried beans, peas, or lentils. Unsalted nuts and seeds. Unsalted canned beans.  Dairy Low-fat dairy products, such as skim or 1% milk, 2% or reduced-fat cheeses, low-fat ricotta or cottage cheese, or plain low-fat yogurt. Low-sodium or reduced-sodium cheeses.  Fats and Oils Tub margarines without trans fats. Light or reduced-fat mayonnaise and salad dressings (reduced sodium). Avocado. Safflower, olive, or canola oils. Natural peanut or almond butter.  Other Unsalted popcorn and pretzels. The items listed above may not be a complete list of recommended foods or beverages. Contact your dietitian for more options.  +++++++++++++++  WHAT FOODS ARE NOT RECOMMENDED? Grains/ White flour or wheat flour White bread. White pasta. White rice. Refined cornbread. Bagels and croissants. Crackers that contain trans fat.  Vegetables  Creamed or fried vegetables. Vegetables in a . Regular canned vegetables. Regular canned tomato sauce and paste. Regular tomato and vegetable juices.  Fruits Dried fruits. Canned fruit in light or heavy syrup. Fruit juice.  Meat and Other Protein Products Meat in general - RED meat & White meat.  Fatty cuts of meat. Ribs, chicken wings, all processed meats as bacon, sausage, bologna, salami, fatback, hot dogs, bratwurst and packaged luncheon meats.  Dairy Whole or 2% milk, cream, half-and-half, and cream cheese. Whole-fat or sweetened yogurt. Full-fat cheeses or blue cheese. Non-dairy creamers and whipped toppings. Processed cheese, cheese spreads, or cheese curds.  Condiments Onion and garlic salt, seasoned salt, table salt, and sea salt. Canned and packaged gravies. Worcestershire sauce. Tartar sauce. Barbecue sauce. Teriyaki sauce. Soy sauce, including reduced sodium. Steak sauce. Fish sauce. Oyster sauce. Cocktail sauce. Horseradish. Ketchup and mustard. Meat flavorings and tenderizers. Bouillon cubes. Hot sauce.  Tabasco sauce. Marinades. Taco seasonings. Relishes.  Fats and Oils Butter, stick margarine, lard, shortening and bacon fat. Coconut, palm kernel, or palm oils. Regular salad dressings.  Pickles and olives. Salted popcorn and pretzels.  The items listed above may not be a complete list of foods and beverages to avoid.

## 2017-03-17 ENCOUNTER — Other Ambulatory Visit: Payer: Self-pay | Admitting: Internal Medicine

## 2017-03-17 DIAGNOSIS — G4700 Insomnia, unspecified: Secondary | ICD-10-CM

## 2017-03-17 MED ORDER — ALPRAZOLAM 1 MG PO TABS: ORAL_TABLET | ORAL | 1 refills | Status: DC

## 2017-03-19 LAB — BASIC METABOLIC PANEL WITH GFR
BUN / CREAT RATIO: 16 (calc) (ref 6–22)
BUN: 23 mg/dL (ref 7–25)
CO2: 33 mmol/L — AB (ref 20–32)
Calcium: 10.2 mg/dL (ref 8.6–10.4)
Chloride: 99 mmol/L (ref 98–110)
Creat: 1.4 mg/dL — ABNORMAL HIGH (ref 0.60–0.93)
GFR, Est African American: 43 mL/min/{1.73_m2} — ABNORMAL LOW (ref >=60)
GFR, Est Non African American: 37 mL/min/{1.73_m2} — ABNORMAL LOW (ref >=60)
Glucose, Bld: 94 mg/dL (ref 65–99)
POTASSIUM: 3.7 mmol/L (ref 3.5–5.3)
SODIUM: 143 mmol/L (ref 135–146)

## 2017-03-19 LAB — VITAMIN B12: VITAMIN B 12: 763 pg/mL (ref 200–1100)

## 2017-03-19 LAB — CBC WITH DIFFERENTIAL/PLATELET
BASOS ABS: 29 {cells}/uL (ref 0–200)
Basophils Relative: 0.4 %
Eosinophils Absolute: 102 cells/uL (ref 15–500)
Eosinophils Relative: 1.4 %
HCT: 40.7 % (ref 35.0–45.0)
Hemoglobin: 13.5 g/dL (ref 11.7–15.5)
Lymphs Abs: 2387 cells/uL (ref 850–3900)
MCH: 30.5 pg (ref 27.0–33.0)
MCHC: 33.2 g/dL (ref 32.0–36.0)
MCV: 91.9 fL (ref 80.0–100.0)
MPV: 10.4 fL (ref 7.5–12.5)
Monocytes Relative: 7.7 %
NEUTROS PCT: 57.8 %
Neutro Abs: 4219 cells/uL (ref 1500–7800)
Platelets: 170 10*3/uL (ref 140–400)
RBC: 4.43 10*6/uL (ref 3.80–5.10)
RDW: 13.2 % (ref 11.0–15.0)
Total Lymphocyte: 32.7 %
WBC: 7.3 10*3/uL (ref 3.8–10.8)
WBCMIX: 562 {cells}/uL (ref 200–950)

## 2017-03-19 LAB — HEMOGLOBIN A1C
EAG (MMOL/L): 6.2 (calc)
HEMOGLOBIN A1C: 5.5 %{Hb} (ref <5.7)
MEAN PLASMA GLUCOSE: 111 (calc)

## 2017-03-19 LAB — MAGNESIUM: Magnesium: 2.3 mg/dL (ref 1.5–2.5)

## 2017-03-19 LAB — LIPID PANEL
Cholesterol: 174 mg/dL (ref <200)
HDL: 71 mg/dL (ref >50)
LDL Cholesterol (Calc): 70 mg/dL (calc)
NON-HDL CHOLESTEROL (CALC): 103 mg/dL (ref <130)
Total CHOL/HDL Ratio: 2.5 (calc) (ref <5.0)
Triglycerides: 235 mg/dL — ABNORMAL HIGH (ref <150)

## 2017-03-19 LAB — HEPATIC FUNCTION PANEL
AG Ratio: 1.9 (calc) (ref 1.0–2.5)
ALKALINE PHOSPHATASE (APISO): 52 U/L (ref 33–130)
ALT: 12 U/L (ref 6–29)
AST: 25 U/L (ref 10–35)
Albumin: 4.6 g/dL (ref 3.6–5.1)
BILIRUBIN DIRECT: 0.1 mg/dL (ref 0.0–0.2)
BILIRUBIN INDIRECT: 0.4 mg/dL (ref 0.2–1.2)
Globulin: 2.4 g/dL (calc) (ref 1.9–3.7)
Total Bilirubin: 0.5 mg/dL (ref 0.2–1.2)
Total Protein: 7 g/dL (ref 6.1–8.1)

## 2017-03-19 LAB — INSULIN, RANDOM: Insulin: 10.9 u[IU]/mL (ref 2.0–19.6)

## 2017-03-19 LAB — TSH: TSH: 2.59 m[IU]/L (ref 0.40–4.50)

## 2017-03-19 LAB — VITAMIN D 25 HYDROXY (VIT D DEFICIENCY, FRACTURES): Vit D, 25-Hydroxy: 55 ng/mL (ref 30–100)

## 2017-03-19 LAB — METHYLMALONIC ACID, SERUM: Methylmalonic Acid, Quant: 225 nmol/L (ref 87–318)

## 2017-03-23 ENCOUNTER — Other Ambulatory Visit: Payer: Self-pay | Admitting: *Deleted

## 2017-03-23 DIAGNOSIS — G4700 Insomnia, unspecified: Secondary | ICD-10-CM

## 2017-03-23 MED ORDER — ALPRAZOLAM 1 MG PO TABS: ORAL_TABLET | ORAL | 2 refills | Status: DC

## 2017-04-14 ENCOUNTER — Other Ambulatory Visit: Payer: Self-pay | Admitting: Adult Health

## 2017-04-21 ENCOUNTER — Other Ambulatory Visit: Payer: Self-pay | Admitting: Internal Medicine

## 2017-04-21 MED ORDER — ESTRADIOL 0.5 MG PO TABS: ORAL_TABLET | ORAL | 3 refills | Status: DC

## 2017-04-25 ENCOUNTER — Other Ambulatory Visit: Payer: Self-pay | Admitting: Internal Medicine

## 2017-04-25 MED ORDER — ESTRADIOL 0.5 MG PO TABS: ORAL_TABLET | ORAL | 3 refills | Status: DC

## 2017-04-28 ENCOUNTER — Encounter: Payer: Self-pay | Admitting: Neurology

## 2017-04-28 ENCOUNTER — Telehealth: Payer: Self-pay | Admitting: Neurology

## 2017-04-28 ENCOUNTER — Ambulatory Visit (INDEPENDENT_AMBULATORY_CARE_PROVIDER_SITE_OTHER): Payer: Medicare HMO | Admitting: Neurology

## 2017-04-28 VITALS — BP 134/75 | HR 67 | Ht 61.0 in | Wt 114.0 lb

## 2017-04-28 DIAGNOSIS — R413 Other amnesia: Secondary | ICD-10-CM

## 2017-04-28 DIAGNOSIS — F0390 Unspecified dementia without behavioral disturbance: Secondary | ICD-10-CM

## 2017-04-28 NOTE — Patient Instructions (Addendum)
You have complaints of memory loss: memory loss or changes in cognitive function can have many reasons and does not always mean you have dementia. Conditions that can contribute to subjective or objective memory loss include: depression, stress, poor sleep from insomnia or sleep apnea, dehydration, fluctuation in blood sugar values, thyroid or electrolyte dysfunction and certain vitamin deficiencies. Dementia can be caused by stroke, brain atherosclerosis or brain vascular disease due to vascular risk factors (smoking, high blood pressure, high cholesterol, obesity and uncontrolled diabetes), certain degenerative brain disorders (including Parkinson's disease and Multiple sclerosis) and by Alzheimer's disease or other, more rare and sometimes hereditary causes. We will not do blood work (which has been done recently already) but we will do a brain scan. We will not start medication as yet, But I would like to consider medication in the near future, after testing. We will also request a formal cognitive test called neuropsychological evaluation which is done by a licensed neuropsychologist. We will make a referral in that regard. We will call you with brain scan test results and monitor your symptoms.   I am concerned about your driving. Please consider giving up driving, have your family monitor it too.    Please stay better hydrated with water and try to exercise regularly.

## 2017-04-28 NOTE — Telephone Encounter (Signed)
Mcarthur Rossetti Josem Kaufmann: [id number] (exp. 04/28/17 to 05/28/17) order setn to GI. They will reach out to the pt to schedule.

## 2017-04-28 NOTE — Progress Notes (Signed)
Subjective:    Patient ID: Stacy Wallace is a 72 y.o. female.  HPI     Star Age, MD, PhD [facility] [address] [place], [place] [address]  Dear Dr. Melford Aase,   I saw your patient, Stacy Wallace, upon your kind request in my neurologic clinic today for initial consultation of her memory loss. The patient is accompanied by her daughter today. As you know, Stacy Wallace is a 72 year old right-handed woman with an underlying medical history of hypertension, hypothyroidism, vitamin D deficiency, hyperlipidemia, reflux disease, vitamin B12 deficiency, and insomnia, who reports memory loss in terms of forgetfulness. She is not sure how long. She reports that it is probably normal to be forgetful at her age. Information was also provided by her daughter Claiborne Billings. She reports that approximately 8 year history of memory loss and her mom, particularly noticeable after she retired at age 16. She works for [employer]. In the past couple of years of memory loss has been much more pronounced. She has been repeating herself often, is more forgetful, has had some trouble driving including getting lost. She lives in a two-story home. She does not typically exercise but reports that she goes up and down the stairs a lot at her house. Her husband is 41 years old and still works. I reviewed your office note from 03/16/2017. She has had recent blood work through your office on 03/23/2017 which I reviewed. B12 level was in the normal range, the vitamin D level was normal, A1c was in the normal range, cholesterol level good, creatinine level 1.4. She is married and lives with her husband. She quit smoking in 1984, drinks alcohol occasionally, drinks caffeine daily. She does not always drink enough water according to the daughter. She has one son and one daughter, both live locally. She has no obvious family history of dementia. She had 4 brothers, 2 passed away, the other 2  she does not have close contact with. She has 2 sisters, none with memory loss. She has lost weight slowly over the past few years, more stable in the past couple of years. She snores but daughter has never noted any positives in her breathing.   Her Past Medical History Is Significant For: Past Medical History:  Diagnosis Date  . GERD (gastroesophageal reflux disease)   . Hyperlipidemia   . Hypertension   . Other abnormal glucose   . Unspecified hypothyroidism   . Unspecified vitamin D deficiency     Her Past Surgical History Is Significant For: Past Surgical History:  Procedure Laterality Date  . ABDOMINAL HYSTERECTOMY    . CARPAL TUNNEL RELEASE Bilateral 2001  . EYE SURGERY    . NASAL SEPTUM SURGERY    . TUBAL LIGATION      Her Family History Is Significant For: Family History  Problem Relation Age of Onset  . Hypertension Mother   . Hyperlipidemia Mother   . Heart disease Father   . Alcohol abuse Father   . Hyperlipidemia Father   . Hypertension Father   . Heart disease Sister   . Stroke Sister   . Depression Sister   . Heart disease Brother     Her Social History Is Significant For: Social History   Socioeconomic History  . Marital status: Married    Spouse name: Not on file  . Number of children: Not on file  . Years of education: Not on file  . Highest education level: Not on file  Occupational History  . Not on file  Social Needs  . Financial resource strain: Not on file  . Food insecurity:    Worry: Not on file    Inability: Not on file  . Transportation needs:    Medical: Not on file    Non-medical: Not on file  Tobacco Use  . Smoking status: Former Smoker    Last attempt to quit: 03/20/1988    Years since quitting: 29.1  . Smokeless tobacco: Never Used  Substance and Sexual Activity  . Alcohol use: Yes    Comment: rare glass of wine  . Drug use: Not on file  . Sexual activity: Not on file  Lifestyle  . Physical activity:    Days per week:  Not on file    Minutes per session: Not on file  . Stress: Not on file  Relationships  . Social connections:    Talks on phone: Not on file    Gets together: Not on file    Attends religious service: Not on file    Active member of club or organization: Not on file    Attends meetings of clubs or organizations: Not on file    Relationship status: Not on file  Other Topics Concern  . Not on file  Social History Narrative  . Not on file    Her Allergies Are:  Allergies  Allergen Reactions  . Lopid [Gemfibrozil]     headache  . Morphine And Related Other (See Comments)    Reaction: unknown   . Nickel Other (See Comments)    Reaction: unknown   . Latex Rash  :   Her Current Medications Are:  Outpatient Encounter Medications as of 04/28/2017  Medication Sig  . ALPRAZolam (XANAX) 1 MG tablet Take 1/2 to 1 tablet at hour of sleep ONLY as needed  . atenolol (TENORMIN) 100 MG tablet TAKE 1 TABLET EVERY DAY  . benazepril (LOTENSIN) 40 MG tablet Take tablet at night for BP  . BIOTIN PO Take 1 capsule by mouth daily.  . Cholecalciferol (D 5000) 5000 UNITS capsule Take 5,000 Units by mouth daily.  Marland Kitchen estradiol (ESTRACE) 0.5 MG tablet Take 1 tablet daily  . furosemide (LASIX) 80 MG tablet TAKE 1 TABLET EVERY DAY  . levothyroxine (SYNTHROID, LEVOTHROID) 50 MCG tablet TAKE 1 TABLET EVERY DAY BEFORE BREAKFAST  . Magnesium 500 MG TABS Take 500 mg by mouth daily.  . meloxicam (MOBIC) 15 MG tablet TAKE 1 TABLET EVERY DAY AS NEEDED FOR PAIN  . omeprazole (PRILOSEC) 20 MG capsule TAKE 1 CAPSULE EVERY DAY  . rosuvastatin (CRESTOR) 40 MG tablet TAKE 1 TABLET EVERY DAY  OR AS DIRECTED FOR CHOLESTEROL   No facility-administered encounter medications on file as of 04/28/2017.   :   Review of Systems:  Out of a complete 14 point review of systems, all are reviewed and negative with the exception of these symptoms as listed below: Review of Systems  Neurological:       Pt presents today to  discuss her memory. Pt reports that she repeats herself a lot.    Objective:  Neurological Exam  Physical Exam Physical Examination:   Vitals:   04/28/17 1443  BP: 134/75  Pulse: 67    General Examination: The patient is a very pleasant 71 y.o. female in no acute distress. She appears well-developed and well-nourished and well groomed.   HEENT: Normocephalic, atraumatic, pupils are equal, round and reactive to light and accommodation. Extraocular tracking is  good without limitation to gaze excursion or nystagmus noted. Normal smooth pursuit is noted. Hearing is grossly intact. Tympanic membranes are clear bilaterally. Face is symmetric with normal facial animation and normal facial sensation. Speech is clear with no dysarthria noted. There is no hypophonia. There is no lip, neck/head, jaw or voice tremor. Neck is supple with full range of passive and active motion. There are no carotid bruits on auscultation. Oropharynx exam reveals: moderate mouth dryness, adequate dental hygiene and no significant airway crowding, she does have a smaller airway entry. Tongue protrudes centrally and palate elevates symmetrically.  Chest: Clear to auscultation without wheezing, rhonchi or crackles noted.  Heart: S1+S2+0, regular and normal without murmurs, rubs or gallops noted.   Abdomen: Soft, non-tender and non-distended with normal bowel sounds appreciated on auscultation.  Extremities: There is no pitting edema in the distal lower extremities bilaterally. Pedal pulses are intact.  Skin: Warm and dry without trophic changes noted.  Musculoskeletal: exam reveals no obvious joint deformities, tenderness or joint swelling or erythema.   Neurologically:  Mental status: The patient is awake, alert and oriented to city, state and self. Her immediate and remote memory, attention, language skills and fund of knowledge are impaired. There is no evidence of aphasia, agnosia, apraxia or anomia. Speech is  clear with normal prosody and enunciation. Thought process is linear. Mood is normal and affect is normal.   04/28/2017: MMSE: 16/30, CDT: 2/4, AFT: 5/min.  Cranial nerves II - XII are as described above under HEENT exam. In addition: shoulder shrug is normal with equal shoulder height noted. Motor exam: Normal bulk, strength and tone is noted. There is no drift, tremor or rebound. Romberg is negative. Reflexes are 1-2+ throughout. Fine motor skills and coordination: intact grossly.   Cerebellar testing: No dysmetria or intention tremor.  Sensory exam: intact to light touch.  Gait, station and balance: She stands easily. No veering to one side is noted. No leaning to one side is noted. Posture is age-appropriate and stance is narrow based. Gait shows normal stride length and normal pace. No problems turning are noted.               Assessment and Plan:    In summary, Stacy Wallace is a very pleasant 72 y.o.-year old female with an underlying medical history of hypertension, hypothyroidism, vitamin D deficiency, hyperlipidemia, reflux disease, vitamin B12 deficiency, and insomnia, who presents for initial consultation of her memory loss. She has had a several year history of memory loss, more noticeable in the past couple of years. Her MMSE suggests moderate difficulty, she may be at risk for Alzheimer's disease. She does have vascular risk factors as well. Further testing would be helpful. I suggested a brain MRI without contrast. I also suggested formal neuropsychological evaluation for cognitive testing with a license neuropsychologist. She is amenable to further testing. We also talked about her recent blood test result, I did not suggest any additional blood work at this point from my end. We talked about potentially utilizing memory loss medication/dementia medication in the near future. I would like to have her family monitor her driving, I'm worried that she may not be safe to drive at this point.  She is advised of this. I had a long chat with the patient and Claiborne Billings about my findings and the diagnosis of dementia, the prognosis and treatment options. We talked about medical treatments and non-pharmacological approaches. We talked about maintaining a healthy lifestyle in general. I encouraged the  patient to eat healthy, exercise daily and keep well hydrated, to keep a scheduled bedtime and wake time routine, to not skip any meals and eat healthy snacks in between meals and to have protein with every meal.   As far as further diagnostic testing is concerned, I suggested the following today: MRI brain without contrast, neuropsychological evaluation. Referral was done.  As far as medications are concerned, I recommended the following at this time: no change as yet.  I answered all their questions today and the patient and her daughter were in agreement with the above outlined plan. I would like to see the patient back in 3 months, sooner if the need arises and encouraged them to call with any interim questions, concerns, problems or updates.  Thank you very much for allowing me to participate in the care of this nice patient. If I can be of any further assistance to you please do not hesitate to call me at [phone].  Sincerely,   Star Age, MD, PhD

## 2017-04-29 ENCOUNTER — Encounter: Payer: Self-pay | Admitting: Psychology

## 2017-04-29 ENCOUNTER — Other Ambulatory Visit: Payer: Self-pay | Admitting: *Deleted

## 2017-04-29 MED ORDER — ESTRADIOL 0.5 MG PO TABS: ORAL_TABLET | ORAL | 3 refills | Status: DC

## 2017-05-07 ENCOUNTER — Ambulatory Visit
Admission: RE | Admit: 2017-05-07 | Discharge: 2017-05-07 | Disposition: A | Discharge status: Home / Self Care | Payer: Medicare HMO | Source: Ambulatory Visit | Attending: Neurology | Admitting: Neurology

## 2017-05-07 DIAGNOSIS — R413 Other amnesia: Secondary | ICD-10-CM | POA: Diagnosis not present

## 2017-05-07 DIAGNOSIS — F0390 Unspecified dementia without behavioral disturbance: Secondary | ICD-10-CM

## 2017-05-10 ENCOUNTER — Telehealth: Payer: Self-pay

## 2017-05-10 NOTE — Telephone Encounter (Signed)
I called pt's daughter, Claiborne Billings, per DPR, to discuss pt's MRI results. No answer, left a message asking her to call me back.

## 2017-05-10 NOTE — Telephone Encounter (Signed)
-----   Message from Star Age, MD sent at 05/10/2017  3:30 PM EDT ----- Please call patient regarding the recent brain MRI: The brain scan showed a normal structure of the brain and mild volume loss which we call atrophy. There were changes in the deeper structures of the brain, which we call white matter changes or microvascular changes. These were reported as mild in Her case. These are tiny white spots, that occur with time and are seen in a variety of conditions, including with normal aging, chronic hypertension, chronic headaches, especially migraine HAs, chronic diabetes, chronic hyperlipidemia. These are not strokes and no mass or lesion were seen which is reassuring. Again, there were no acute findings, such as a stroke, or mass or blood products. No further action is required on this test at this time, other than re-enforcing the importance of good blood pressure control, good cholesterol control, good blood sugar control, and weight management. Please remind patient to keep any upcoming appointments or tests and to call us with any interim questions, concerns, problems or updates. Thanks,   FYI: she is scheduled for FU in August, and neuropsych evals in September with FU in Oct. Star Age, MD, PhD

## 2017-05-10 NOTE — Progress Notes (Signed)
Please call patient regarding the recent brain MRI: The brain scan showed a normal structure of the brain and mild volume loss which we call atrophy. There were changes in the deeper structures of the brain, which we call white matter changes or microvascular changes. These were reported as mild in Her case. These are tiny white spots, that occur with time and are seen in a variety of conditions, including with normal aging, chronic hypertension, chronic headaches, especially migraine HAs, chronic diabetes, chronic hyperlipidemia. These are not strokes and no mass or lesion were seen which is reassuring. Again, there were no acute findings, such as a stroke, or mass or blood products. No further action is required on this test at this time, other than re-enforcing the importance of good blood pressure control, good cholesterol control, good blood sugar control, and weight management. Please remind patient to keep any upcoming appointments or tests and to call us with any interim questions, concerns, problems or updates. Thanks,   FYI: she is scheduled for FU in August, and neuropsych evals in September with FU in Oct. Star Age, MD, PhD

## 2017-05-11 NOTE — Telephone Encounter (Signed)
I called pt's daughter, Claiborne Billings, per DPR. I explained pt's MRI results and recommendations. Pt's daughter reports they will keep the neuro psych appts as scheduled. Pt's daughter Claiborne Billings verbalized understanding of pt's results and had no questions at this time, but was encouraged to call back if concerns arise.

## 2017-06-14 ENCOUNTER — Other Ambulatory Visit: Payer: Self-pay

## 2017-06-14 MED ORDER — TRAZODONE HCL 150 MG PO TABS: ORAL_TABLET | ORAL | 0 refills | Status: DC

## 2017-06-14 NOTE — Telephone Encounter (Signed)
Patient came into the office and requesting a refill on her prescription for Xanax. Patient was informed that she was not due yet for a refill. Last refill date was 06/01/17. Per Dr. Melford Aase, a new prescription for Trazodone 150mg  with no refills be authorized to be filled.

## 2017-06-21 ENCOUNTER — Other Ambulatory Visit: Payer: Self-pay | Admitting: Adult Health

## 2017-07-05 ENCOUNTER — Other Ambulatory Visit: Payer: Self-pay | Admitting: Internal Medicine

## 2017-07-19 ENCOUNTER — Other Ambulatory Visit: Payer: Self-pay | Admitting: Internal Medicine

## 2017-08-05 ENCOUNTER — Ambulatory Visit: Payer: Medicare HMO | Admitting: Neurology

## 2017-09-02 ENCOUNTER — Other Ambulatory Visit: Payer: Self-pay | Admitting: Internal Medicine

## 2017-09-02 ENCOUNTER — Encounter: Payer: Self-pay | Admitting: Internal Medicine

## 2017-09-02 DIAGNOSIS — G4700 Insomnia, unspecified: Secondary | ICD-10-CM

## 2017-09-02 MED ORDER — ALPRAZOLAM 1 MG PO TABS: ORAL_TABLET | ORAL | 0 refills | Status: DC

## 2017-09-15 ENCOUNTER — Other Ambulatory Visit: Payer: Self-pay | Admitting: Adult Health

## 2017-10-04 ENCOUNTER — Ambulatory Visit (INDEPENDENT_AMBULATORY_CARE_PROVIDER_SITE_OTHER): Payer: Medicare HMO | Admitting: Psychology

## 2017-10-04 ENCOUNTER — Encounter: Payer: Self-pay | Admitting: Psychology

## 2017-10-04 ENCOUNTER — Ambulatory Visit: Payer: Medicare HMO | Admitting: Psychology

## 2017-10-04 DIAGNOSIS — F0390 Unspecified dementia without behavioral disturbance: Secondary | ICD-10-CM

## 2017-10-04 DIAGNOSIS — R413 Other amnesia: Secondary | ICD-10-CM

## 2017-10-04 NOTE — Progress Notes (Signed)
   Neuropsychology Note  Stacy Wallace completed 60 minutes of neuropsychological testing with technician, Milana Kidney, BS, under the supervision of Dr. Macarthur Critchley, Licensed Psychologist. The patient did not appear overtly distressed by the testing session, per behavioral observation or via self-report to the technician. Rest breaks were offered.   Clinical Decision Making: In considering the patient's current level of functioning, level of presumed impairment, nature of symptoms, emotional and behavioral responses during the interview, level of literacy, and observed level of motivation/effort, a battery of tests was selected and communicated to the psychometrician.  Communication between the psychologist and technician was ongoing throughout the testing session and changes were made as deemed necessary based on patient performance on testing, technician observations and additional pertinent factors such as those listed above.  Stacy Wallace will return within approximately 2 weeks for an interactive feedback session with Dr. Si Raider at which time her test performances, clinical impressions and treatment recommendations will be reviewed in detail. The patient understands she can contact our office should she require our assistance before this time.  35 minutes spent performing neuropsychological evaluation services/clinical decision making (psychologist). [CPT 32671] 60 minutes spent face-to-face with patient administering standardized tests, 30 minutes spent scoring (technician). [CPT [id number], 24580]  Full report to follow.

## 2017-10-04 NOTE — Progress Notes (Signed)
NEUROBEHAVIORAL STATUS EXAM   Name: Stacy Wallace Date of Birth: 12-09-1945 Date of Interview: 10/04/2017  Reason for Referral:  Stacy Wallace is a 72 y.o. female who is referred for neuropsychological evaluation by Dr. Star Age of [facility] due to concerns about memory loss. This patient is accompanied in the office by her daughter who supplements the history.  History of Presenting Problem:  Stacy Wallace was seen by Dr. Rexene Alberts for neurologic consultation of memory loss on 04/28/2017. It was noted that there was an approximately 8 year history of memory loss in the patient, per her daughter, particularly noticeable after she retired at age 50. It was noted that memory loss had become much more pronounced in the past couple of years. Family history of dementia was denied. MMSE was 16/30, CDT was 2/4, and AFT was 5/min. Brain MRI was completed on 05/07/2017 and reportedly revealed mild generalized cortical atrophy, some scattered T2/FLAIR hyperintense foci in the hemispheres and left cerebellum, and no acute findings.  At today's visit (10/04/2017), the patient reports that any memory issues she is having are normal for her age. She stated she will go to get something from a room and then forget what she went to get, but then it comes back to her. The patient denies forgetfulness for recent conversations or events, misplacing/losing items, word finding difficulty, comprehension difficulty, or any driving difficulty. In speaking with her daughter privately, it is clear that the family has observed major memory issues over the years starting with short term memory difficulty but now extending to long term memory. For example, she will have trouble remembering her grandchildren and great grandchildren, and she forgets that her daughter is married. 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 She repeats  herself constantly and goes in "loops" with her daughter and husband. She was independently managing the finances in the past but her husband is more involved in this now as the patient was getting lots of calls from "scammers" and was withdrawing large amounts of cash (e.g., $10,000) with no accounting of where the money was going. She has also been suspicious of her husband doing something with her money. She has missed a doctor's appointment in the past so now her daughter goes to all appointments with her. The patient still does the cooking but is able to cook a lot less than she used to. She forgets how to make things. She forgets that she has eaten.  Physically, the patient denies any complaints aside from chronic hip/tailbone pain from a fall down stairs over 10 years ago. She has not had any falls recently. She has no history of head injury. She denies difficulty with walking or balance.  She is eating less and reports she has an appetite and gets hungry but cannot taste anything for 1 1/2 - 2 years. Per her daughter she has lost a lot of weight over the past few years. The patient denies loss of sense of smell.   The patient describes her mood as "fine". She does admit she gets lonely as she is home by herself most of the day while her husband works. However she does not want to socialize. She has told her daughter she is depressed. She is pretty isolated. She denies feeling nervous or anxious. She denies suicidal ideation or intention to me, but apparently has told her daughter "I don't even  want to be here" as recently as last week. She denies visual or auditory hallucinations and her daughter has not observed any either.  Of note, the patient has been on Xanax for decades. She has been medicated for depression in the past as well. She has never seen a therapist or counselor. She tells me she takes Xanax for sleep and only takes 1/2 tablet nightly and has never had to increase the dose. She is  dismissive when I mention the anticholinergic properties that can affect memory in older adults. She does not think the medication is problematic.   Social History: Born/Raised: [place] Education: GED, finished 11 years prior to that Occupational history: Retired since age 68, worked at [employer] Marital history: Married x58 years. She has 1 daughter + 1 son  She demonstrated great difficulty recalling her grandchildren (their names and how many she had). Alcohol: "Little wine once in a while", no heavy alcohol use Tobacco: Former smoker, quit 1990   Medical History: Past Medical History:  Diagnosis Date  . GERD (gastroesophageal reflux disease)   . Hyperlipidemia   . Hypertension   . Other abnormal glucose   . Unspecified hypothyroidism   . Unspecified vitamin D deficiency       Current Medications:  Outpatient Encounter Medications as of 10/04/2017  Medication Sig  . ALPRAZolam (XANAX) 1 MG tablet Take 1/2 to 1 tablet at hour of sleep ONLY as needed  . atenolol (TENORMIN) 100 MG tablet TAKE 1 TABLET EVERY DAY  . benazepril (LOTENSIN) 40 MG tablet Take tablet at night for BP  . BIOTIN PO Take 1 capsule by mouth daily.  . Cholecalciferol (D 5000) 5000 UNITS capsule Take 5,000 Units by mouth daily.  Marland Kitchen estradiol (ESTRACE) 0.5 MG tablet Take 1 tablet daily  . furosemide (LASIX) 80 MG tablet TAKE 1 TABLET EVERY DAY  . levothyroxine (SYNTHROID, LEVOTHROID) 50 MCG tablet TAKE 1 TABLET EVERY DAY BEFORE BREAKFAST  . Magnesium 500 MG TABS Take 500 mg by mouth daily.  . meloxicam (MOBIC) 15 MG tablet TAKE 1 TABLET EVERY DAY AS NEEDED FOR PAIN  . omeprazole (PRILOSEC) 20 MG capsule TAKE 1 CAPSULE EVERY DAY  . rosuvastatin (CRESTOR) 40 MG tablet TAKE 1 TABLET EVERY DAY  OR AS DIRECTED FOR CHOLESTEROL  . traZODone (DESYREL) 150 MG tablet Take 1 tablet by mouth one hour prior to bedtime.   No facility-administered encounter medications on file as of 10/04/2017.      Behavioral  Observations:   Appearance: Neatly, casually and appropriately dressed and groomed Gait: Ambulated independently, no gross abnormalities observed Speech: Fluent; normal rate, rhythm and volume. Repeats herself throughout the interview. Thought process: Generally linear Affect: Full range, mildly anxious Interpersonal: Generally pleasant, somewhat defensive Lack of insight into cognitive difficulties   60 minutes spent face-to-face with patient completing neurobehavioral status exam. 40 minutes spent integrating medical records/clinical data and completing this report. [id number] unit; [id number] unit.   TESTING: There is medical necessity to proceed with neuropsychological assessment as the results will be used to aid in differential diagnosis and clinical decision-making and to inform specific treatment recommendations. Per the patient, her daughter and medical records reviewed, there has been a change in cognitive functioning and a reasonable suspicion of dementia.  Clinical Decision Making: In considering the patient's current level of functioning, level of presumed impairment, nature of symptoms, emotional and behavioral responses during the interview, level of literacy, and observed level of motivation, a battery of tests was selected and communicated  to the psychometrician.   Following the clinical interview/neurobehavioral status exam, the patient completed this full battery of neuropsychological testing with my psychometrician under my supervision (see separate note).   PLAN: The patient will return to see me for a follow-up session at which time her test performances and my impressions and treatment recommendations will be reviewed in detail.  Evaluation ongoing; full report to follow.

## 2017-10-14 ENCOUNTER — Encounter: Payer: Medicare HMO | Admitting: Psychology

## 2017-10-25 ENCOUNTER — Encounter: Payer: Medicare HMO | Admitting: Psychology

## 2017-10-28 NOTE — Progress Notes (Signed)
NEUROPSYCHOLOGICAL EVALUATION   Name:    Stacy Wallace  Date of Birth:   Oct 31, 1945 Date of Interview:  10/04/2017 Date of Testing:  10/04/2017   Date of Feedback:  11/01/2017       Background Information:  Reason for Referral:  Stacy Wallace is a 72 y.o. female referred by Dr. Rexene Alberts of [facility] to assess her current level of cognitive functioning and assist in differential diagnosis. The current evaluation consisted of a review of available medical records, an interview with the patient and her daughter, and the completion of a neuropsychological testing battery. Informed consent was obtained.  History of Presenting Problem:  Stacy Wallace was seen by Dr. Rexene Alberts for neurologic consultation of memory loss on 04/28/2017. It was noted that there was an approximately 8 year history of memory loss in the patient, per her daughter, particularly noticeable after she retired at age 45. It was noted that memory loss had become much more pronounced in the past couple of years. Family history of dementia was denied. MMSE was 16/30, CDT was 2/4, and AFT was 5/min. Brain MRI was completed on 05/07/2017 and reportedly revealed mild generalized cortical atrophy, some scattered T2/FLAIR hyperintense foci in the hemispheres and left cerebellum, and no acute findings.  At today's visit (10/04/2017), the patient reports that any memory issues she is having are normal for her age. She stated she will go to get something from a room and then forget what she went to get, but then it comes back to her. The patient denies forgetfulness for recent conversations or events, misplacing/losing items, word finding difficulty, comprehension difficulty, or any driving difficulty. In speaking with her daughter privately, it is clear that the family has observed major memory issues over the years starting with short term memory difficulty but now extending to long term memory. For example, she will have trouble remembering  her grandchildren and great grandchildren, and she forgets that her daughter is married. 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 She repeats herself constantly and goes in "loops" with her daughter and husband. She was independently managing the finances in the past but her husband is more involved in this now as the patient was getting lots of calls from "scammers" and was withdrawing large amounts of cash (e.g., $10,000) with no accounting of where the money was going. She has also been suspicious of her husband doing something with her money. She has missed a doctor's appointment in the past so now her daughter goes to all appointments with her. The patient still does the cooking but is able to cook a lot less than she used to. She forgets how to make things. She forgets that she has eaten.  The patient's daughter completed the AD8 dementia screening measure and endorsed 8/8 changes in behavior and cognition.   Physically, the patient denies any complaints aside from chronic hip/tailbone pain from a fall down stairs over 10 years ago. She has not had any falls recently. She has no history of head injury. She denies difficulty with walking or balance.  She is eating less and reports she has an appetite and gets hungry but cannot taste anything for 1 1/2 - 2 years. Per her daughter she has lost a lot of weight over the past few years. The patient denies loss of sense of smell.   The patient describes her mood as "fine".  She does admit she gets lonely as she is home by herself most of the day while her husband works. However she does not want to socialize. She has told her daughter she is depressed. She is pretty isolated. She denies feeling nervous or anxious. She denies suicidal ideation or intention to me, but apparently has told her daughter "I don't even want to be here" as recently as last week. She denies visual or  auditory hallucinations and her daughter has not observed any either.  Of note, the patient has been on Xanax for decades. She has been medicated for depression in the past as well. She has never seen a therapist or counselor. She tells me she takes Xanax for sleep and only takes 1/2 tablet nightly and has never had to increase the dose. She is dismissive, slightly defensive, when I mention the anticholinergic properties that can affect memory in older adults. She does not think the medication is problematic.   Social History: Born/Raised: [place] Education: GED, finished 11 years prior to that Occupational history: Retired since age 25, worked at [employer] Marital history: Married x58 years. She has 1 daughter + 1 son  She demonstrated great difficulty recalling her grandchildren (their names and how many she had). Alcohol: "Wallace wine once in a while", no heavy alcohol use Tobacco: Former smoker, quit 1990   Medical History:  Past Medical History:  Diagnosis Date  . GERD (gastroesophageal reflux disease)   . Hyperlipidemia   . Hypertension   . Other abnormal glucose   . Unspecified hypothyroidism   . Unspecified vitamin D deficiency     Current medications:  Outpatient Encounter Medications as of 11/01/2017  Medication Sig  . ALPRAZolam (XANAX) 1 MG tablet Take 1/2 to 1 tablet at hour of sleep ONLY as needed  . atenolol (TENORMIN) 100 MG tablet TAKE 1 TABLET EVERY DAY  . benazepril (LOTENSIN) 40 MG tablet Take tablet at night for BP  . BIOTIN PO Take 1 capsule by mouth daily.  . Cholecalciferol (D 5000) 5000 UNITS capsule Take 5,000 Units by mouth daily.  Marland Kitchen estradiol (ESTRACE) 0.5 MG tablet Take 1 tablet daily  . furosemide (LASIX) 80 MG tablet TAKE 1 TABLET EVERY DAY  . levothyroxine (SYNTHROID, LEVOTHROID) 50 MCG tablet TAKE 1 TABLET EVERY DAY BEFORE BREAKFAST  . Magnesium 500 MG TABS Take 500 mg by mouth daily.  . meloxicam (MOBIC) 15 MG tablet TAKE 1 TABLET EVERY DAY  AS NEEDED FOR PAIN  . omeprazole (PRILOSEC) 20 MG capsule TAKE 1 CAPSULE EVERY DAY  . rosuvastatin (CRESTOR) 40 MG tablet TAKE 1 TABLET EVERY DAY  OR AS DIRECTED FOR CHOLESTEROL  . traZODone (DESYREL) 150 MG tablet Take 1 tablet by mouth one hour prior to bedtime.   No facility-administered encounter medications on file as of 11/01/2017.      Current Examination:  Behavioral Observations:  Appearance: Neatly, casually and appropriately dressed and groomed Gait: Ambulated independently, no gross abnormalities observed Speech: Fluent; normal rate, rhythm and volume. Repeats herself throughout the interview. Thought process: Generally linear Affect: Full range, mildly anxious Interpersonal: Generally pleasant, somewhat defensive Insight/Awareness: Lack of insight into cognitive difficulties Orientation: Oriented to person and current city. Disoriented to month, date, year and day of the week. Accurately named the current President but could not name his predecessor.    Tests Administered: . Test of Premorbid Functioning (TOPF) . Wechsler Adult Intelligence Scale-Fourth Edition (WAIS-IV): Similarities, Music therapist, Coding and Digit Span subtests . Wechsler Memory Scale-Fourth  Edition (WMS-IV) Older Adult Version (ages 14-90): Logical Memory I, II and Recognition subtests  . [employer] Verbal Learning Test - 2nd Edition (CVLT-2) Short Form . Repeatable Battery for the Assessment of Neuropsychological Status (RBANS) Form A:  Figure Copy and Recall subtests and Semantic Fluency subtest . Boston Naming Test (BNT) . Boston Diagnostic Aphasia Examination: Complex Ideational Material subtest . Controlled Oral Word Association Test (COWAT) . Trail Making Test A and B . Clock drawing test . Beck Depression Inventory - 2nd Edition (BDI-II) . Generalized Anxiety Disorder - 7 item screener (GAD-7) . AD8 Dementia Screening  (AD8) - completed by patient's daughter  Test Results: Note:  Standardized scores are presented only for use by appropriately trained professionals and to allow for any future test-retest comparison. These scores should not be interpreted without consideration of all the information that is contained in the rest of the report. The most recent standardization samples from the test publisher or other sources were used whenever possible to derive standard scores; scores were corrected for age, gender, ethnicity and education when available.   Test Scores:  Test Name Raw Score Standardized Score Descriptor  TOPF 37/70 SS= 97 Average  WAIS-IV Subtests     Similarities 13/36 ss= 5 Borderline  Block Design 28/66 ss= 9 Average  Coding 20/135 ss= 4 Impaired  Digit Span Forward 8/16 ss= 8 Low end of average  Digit Span Backward 5/16 ss= 6 Low average  WMS-IV Subtests     LM I 7/53 ss= 2 Impaired   LM II 0/39 ss= 1 Severely impaired  LM II Recognition 16/23 Cum %: 17-25 Below average  RBANS Subtests     Figure Copy 17/20 Z= -0.4 Average  Figure Recall 7/20 Z= -1.3 Low average  Semantic Fluency 5 Z= -2.9 Severely impaired  CVLT-II Scores     Trial 1 1/9 Z= -3 Severely impaired  Trial 4 5/9 Z= -2 Impaired  Trials 1-4 total 13/36 T= 20 Severely impaired  SD Free Recall 3/9 Z= -2 Impaired  LD Free Recall 2/9 Z= -1.5 Borderline  LD Cued Recall 3/9 Z= -2 Impaired  Recognition Discriminability 8/9 hits 6 false positives Z= -1.5 Borderline  Forced Choice Recognition 9/9  WNL  BNT 53/60 T= 50 Average  BDAE Complex Ideational Material 6/12  Severely impaired  COWAT-FAS 31 T= 46 Average  COWAT-Animals 6 T= 26 Impaired  Trail Making Test A  43" 0 errors T= 49 Average  Trail Making Test B  Pt unable     Clock Drawing   WNL  BDI-II 2/63  WNL  GAD-7 0/21  WNL  AD8 8/8  Impaired      Description of Test Results:  Premorbid verbal intellectual abilities were estimated to have been within the average range based on a test of word reading. Psychomotor  processing speed was variable, ranging from impaired on a coding task to average on the simpler Trails A. Auditory attention and working memory were average to low average. Visual-spatial construction was average. Language abilities were variable. Specifically, confrontation naming was average, and semantic verbal fluency was impaired. Auditory comprehension of complex ideational material was severely impaired. With regard to verbal memory, encoding and acquisition of non-contextual information (i.e., word list) was severely impaired. After a brief distracter task, free recall was impaired (3/9 items). After a delay, free recall was borderline (2/9 items). Cued recall was impaired (3/9 items). Performance on a yes/no recognition task was impaired due to a high number of false positive errors. On  another verbal memory test, encoding and acquisition of contextual auditory information (i.e., short stories) was impaired. After a delay, free recall was severely impaired (recalling no aspects of either story). Performance on a yes/no recognition task was below average. With regard to non-verbal memory, delayed free recall of visual information was low average. Performances on tasks measuring various aspects of executive functioning ranged from impaired to average. Mental flexibility and set-shifting were severely impaired; she was unable to comprehend and complete Trails B. Verbal fluency with phonemic search restrictions was average. Verbal abstract reasoning was borderline impaired. Performance on a clock drawing task was normal. On a self-report measure of mood, the patient's responses were not indicative of clinically significant depression at the present time. On a self-report measure of anxiety, the patient did not endorse any symptoms of generalized anxiety.     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 Results of cognitive testing were abnormal and  demonstrated significant decline in several areas of cognitive function relative to estimated baseline. Additionally, there is evidence that her cognitive deficits are interfering with her ability to manage complex ADLs (driving, finances, appointments). As such, diagnostic criteria for a dementia syndrome are met.  The patient's cognitive profile is notable for prominent impairment in temporal orientation, memory encoding and consolidation, semantic fluency, auditory comprehension and some aspects of executive functioning. Her clinical features and cognitive profile are most in line with Alzheimer's disease. It is possible that long term Xanax use has exacerbated underlying cognitive dysfunction. I would characterize stage of dementia as mild to moderate at the present time. She denies depression or anxiety symptoms but there does seem to be at least some mild adjustment related depression likely secondary to lack of social interaction. She also appears to be experiencing some mild paranoid ideation (suspicious of her husband helping her manage finances), which appears to be a neuropsychiatric feature of her dementia.      Recommendations/Plan: Based on the findings of the present evaluation, the following recommendations are offered:  1. From a neuropsychological perspective, the patient appears to be an appropriate candidate for cholinesterase inhibitor therapy. She will follow up with Dr. Rexene Alberts about this. 2. I discussed with the patient and her family the risks of long-term alprazolam use and the risk of confusion and falls in older adults due to anticholinergic properties. I would recommend slowly tapering off the medication if possible. The patient has been taking this medication for decades and is likely physiologically dependent. Antidepressant therapy (eg SSRI) does not have the same risk of cognitive impairment and may be useful in assisting the patient coming off alprazolam. Of course, all  decisions with respect to medications are deferred to her physicians.  3. The patient's performance on a cognitive test highly correlated with driving ability was severely impaired. For the safety of the patient and other motorists, it is my recommendation that she discontinue driving at this time. I had a long discussion with the patient/family about this; the patient is adamant that she can and will continue to drive.  4. She should have assistance with all complex ADLs (finances, appointments, transportation, medications). 5. She is vulnerable to undue influence and financial exploitation, and all safeguards should be taken to reduce the risk of these occurring.  6. She would likely greatly benefit from increased social interaction, mental stimulation and physical exercise. These types of activity would help with brain health but also mood and quality of life. She could attend a day program  or, if feasible, hire a private duty companion, I provided local resources for additional information. 7. The patient's family will benefit from caregiver support and education. I provided local resources and contact information.   Feedback to Patient: CAMYAH PULTZ and her husband, as well as her daughter, returned for a feedback appointment on 11/01/2017 to review the results of her neuropsychological evaluation with this provider. 45 minutes face-to-face time was spent reviewing her test results, my impressions and my recommendations as detailed above.    Total time spent on this patient's case: 100 minutes for neurobehavioral status exam with psychologist (CPT code [phone], [phone]); 90 minutes of testing/scoring by psychometrician under psychologist's supervision (CPT codes [phone], [phone] units); 180 minutes for integration of patient data, interpretation of standardized test results and clinical data, clinical decision making, treatment planning and preparation of this report, and interactive feedback with  review of results to the patient/family by psychologist (CPT codes [phone], [phone] units).      Thank you for your referral of JACQUALIN SHIRKEY. Please feel free to contact me if you have any questions or concerns regarding this report.

## 2017-11-01 ENCOUNTER — Ambulatory Visit (INDEPENDENT_AMBULATORY_CARE_PROVIDER_SITE_OTHER): Payer: Medicare HMO | Admitting: Psychology

## 2017-11-01 ENCOUNTER — Encounter: Payer: Self-pay | Admitting: Psychology

## 2017-11-01 DIAGNOSIS — G301 Alzheimer's disease with late onset: Secondary | ICD-10-CM

## 2017-11-01 DIAGNOSIS — F0281 Dementia in other diseases classified elsewhere with behavioral disturbance: Secondary | ICD-10-CM

## 2017-11-02 ENCOUNTER — Encounter: Payer: Medicare HMO | Admitting: Psychology

## 2017-11-03 ENCOUNTER — Other Ambulatory Visit: Payer: Self-pay | Admitting: Adult Health

## 2017-11-08 ENCOUNTER — Telehealth: Payer: Self-pay | Admitting: Neurology

## 2017-11-08 NOTE — Telephone Encounter (Signed)
Pt daughter(on [facility]-KE[facility] ALEN) has called asking if since Dr Rexene Alberts will manage pt's medication does pt need to keep upcoming appointment with GP, please call
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
## 2017-11-08 NOTE — Telephone Encounter (Signed)
Please advise patient's daughter, that I will also second Dr. Corliss Blacker recommendation that patient no longer drive. However, I can address this during the FU appt only and I cannot contact the DMV or revoke driving privileges.  I would recommend, that her family discuss the driving issues and concerns with regards to safety with the patient directly and follow recommendations as per neuropsych testing.

## 2017-11-08 NOTE — Telephone Encounter (Signed)
I called pt's daughter, Claiborne Billings, per [facility] and discussed Dr. Guadelupe Sabin recommendations. Kelly verbalized understanding and appreciation.

## 2017-11-09 ENCOUNTER — Telehealth: Payer: Self-pay | Admitting: Psychology
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
## 2017-11-09 NOTE — Telephone Encounter (Signed)
Spoke with patient's daughter Claiborne Billings and informed her of the [facility] DMV Request for Driver Re-Examination form she can complete as a concerned citizen. I also completed the form as a health care professional and submitted via fax to [facility] DMV. Patient's daughter appreciative of assistance with this matter.

## 2017-11-11 ENCOUNTER — Ambulatory Visit (INDEPENDENT_AMBULATORY_CARE_PROVIDER_SITE_OTHER): Payer: Medicare HMO | Admitting: Internal Medicine

## 2017-11-11 ENCOUNTER — Encounter: Payer: Self-pay | Admitting: Internal Medicine

## 2017-11-11 VITALS — BP 126/76 | HR 64 | Temp 97.2°F | Resp 16 | Ht 62.0 in | Wt 117.0 lb

## 2017-11-11 DIAGNOSIS — E538 Deficiency of other specified B group vitamins: Secondary | ICD-10-CM | POA: Diagnosis not present

## 2017-11-11 DIAGNOSIS — Z1211 Encounter for screening for malignant neoplasm of colon: Secondary | ICD-10-CM

## 2017-11-11 DIAGNOSIS — Z79899 Other long term (current) drug therapy: Secondary | ICD-10-CM | POA: Diagnosis not present

## 2017-11-11 DIAGNOSIS — K219 Gastro-esophageal reflux disease without esophagitis: Secondary | ICD-10-CM

## 2017-11-11 DIAGNOSIS — E782 Mixed hyperlipidemia: Secondary | ICD-10-CM | POA: Diagnosis not present

## 2017-11-11 DIAGNOSIS — Z0001 Encounter for general adult medical examination with abnormal findings: Secondary | ICD-10-CM

## 2017-11-11 DIAGNOSIS — Z136 Encounter for screening for cardiovascular disorders: Secondary | ICD-10-CM | POA: Diagnosis not present

## 2017-11-11 DIAGNOSIS — R413 Other amnesia: Secondary | ICD-10-CM | POA: Diagnosis not present

## 2017-11-11 DIAGNOSIS — Z0000 Encounter for general adult medical examination without abnormal findings: Secondary | ICD-10-CM | POA: Diagnosis not present

## 2017-11-11 DIAGNOSIS — R7309 Other abnormal glucose: Secondary | ICD-10-CM | POA: Diagnosis not present

## 2017-11-11 DIAGNOSIS — E559 Vitamin D deficiency, unspecified: Secondary | ICD-10-CM | POA: Diagnosis not present

## 2017-11-11 DIAGNOSIS — R7303 Prediabetes: Secondary | ICD-10-CM | POA: Diagnosis not present

## 2017-11-11 DIAGNOSIS — I10 Essential (primary) hypertension: Secondary | ICD-10-CM

## 2017-11-11 DIAGNOSIS — Z1212 Encounter for screening for malignant neoplasm of rectum: Secondary | ICD-10-CM

## 2017-11-11 DIAGNOSIS — E039 Hypothyroidism, unspecified: Secondary | ICD-10-CM

## 2017-11-11 DIAGNOSIS — Z8249 Family history of ischemic heart disease and other diseases of the circulatory system: Secondary | ICD-10-CM

## 2017-11-11 DIAGNOSIS — F0150 Vascular dementia without behavioral disturbance: Secondary | ICD-10-CM

## 2017-11-11 DIAGNOSIS — D649 Anemia, unspecified: Secondary | ICD-10-CM

## 2017-11-11 DIAGNOSIS — Z87891 Personal history of nicotine dependence: Secondary | ICD-10-CM

## 2017-11-11 DIAGNOSIS — I700 Atherosclerosis of aorta: Secondary | ICD-10-CM

## 2017-11-11 NOTE — Progress Notes (Signed)
[facility] Unk Pinto, M.D.     Uvaldo Bristle. Silverio Lay, P.A.-C Liane Comber, [place] [address] [place] [address] Telephone [phone] Telefax [phone] Annual Screening/Preventative Visit & Comprehensive Evaluation &  Examination     This very nice 72 y.o. MWF  presents for a Screening /Preventative Visit & comprehensive evaluation and management of multiple medical co-morbidities.  Patient has been followed for HTN, HLD, Prediabetes  and Vitamin D Deficiency. Patient has GERD controlled with her meds.      Patient has has been seen by Dr Rexene Alberts for further w/u of cognitive decline with Brain MRI showing chronic microvascular changes. Previous blood w/u Apr 2018 found very low Vit B12 was 287   and repeated in March 2019  was up to 763. Extensive Labs done Dec 2018  included negative Lyme panel, RPR, HIV  and ESR which also were Negative & Normal.  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 Patient lacks insight to her memory deficits and is not accepting her diagnosis of memory loss.       HTN predates since  1999. Patient's BP has been controlled at home and patient denies any cardiac symptoms as chest pain, palpitations, shortness of breath, dizziness or ankle swelling. Today's BP is at goal - 126/76.      Patient's hyperlipidemia is controlled with diet and medications. Patient denies myalgias or other medication SE's. Last lipids were at goal albeit elevated Trig's: Lab Results  Component Value Date   CHOL 174 03/16/2017   HDL 71 03/16/2017   LDLCALC 70 03/16/2017   TRIG 235 (H) 03/16/2017   CHOLHDL 2.5 03/16/2017      Patient has hx/o prediabetes  (A1c5.8% / 2011 &5.9% / 2014)  and patient denies reactive hypoglycemic  symptoms, visual blurring, diabetic polys or paresthesias. Last A1c was Normal & at goal: Lab Results  Component Value Date   HGBA1C 5.5 03/16/2017      Patient has been on Thyroid Replacement since 2014.     Finally, patient has history of Vitamin D Deficiency and last Vitamin D was at goal: Lab Results  Component Value Date   VD25OH 55 03/16/2017   Current Outpatient Medications on File Prior to Visit  Medication Sig  . atenolol (TENORMIN) 100 MG tablet TAKE 1 TABLET EVERY DAY  . benazepril (LOTENSIN) 40 MG tablet Take tablet at night for BP  . BIOTIN PO Take 1 capsule by mouth daily.  . Cholecalciferol (D 5000) 5000 UNITS capsule Take 5,000 Units by mouth daily.  Marland Kitchen estradiol (ESTRACE) 0.5 MG tablet Take 1 tablet daily  . furosemide (LASIX) 80 MG tablet TAKE 1 TABLET EVERY DAY  . levothyroxine (SYNTHROID, LEVOTHROID) 50 MCG tablet TAKE 1 TABLET EVERY DAY BEFORE BREAKFAST  . Magnesium 500 MG TABS Take 500 mg by mouth daily.  . meloxicam (MOBIC) 15 MG tablet TAKE 1 TABLET EVERY DAY AS NEEDED FOR PAIN  . omeprazole (PRILOSEC) 20 MG capsule TAKE 1 CAPSULE EVERY DAY  . rosuvastatin (CRESTOR) 40 MG tablet TAKE 1 TABLET EVERY DAY  OR AS DIRECTED FOR CHOLESTEROL   No current facility-administered medications on file prior to visit.    Allergies  Allergen Reactions  . Lopid [Gemfibrozil]     headache  . Morphine And Related Other (  See Comments)    Reaction: unknown   . Nickel Other (See Comments)    Reaction: unknown   . Latex Rash   Past Medical History:  Diagnosis Date  . GERD (gastroesophageal reflux disease)   . Hyperlipidemia   . Hypertension   . Other abnormal glucose   . Unspecified hypothyroidism   . Unspecified vitamin D deficiency    Health Maintenance  Topic Date Due  . Hepatitis C Screening  12-Aug-1945  . COLONOSCOPY  06/23/2016  . MAMMOGRAM  07/10/2017  . INFLUENZA VACCINE  08/05/2017  . TETANUS/TDAP  12/16/2017 (Originally 11/08/2016)  . DEXA SCAN   Completed  . PNA vac Low Risk Adult  Completed   Immunization History  Administered Date(s) Administered  . Influenza, High Dose Seasonal PF 09/19/2013, 10/16/2014, 10/08/2015  . Influenza-Unspecified 09/15/2012, 11/05/2016, 11/05/2017  . Pneumococcal Conjugate-13 08/08/2015  . Pneumococcal-Unspecified 03/18/2010  . Td 11/09/2006   Last Colon - 06/24/2006 - Dr Carlean Purl overdue 10 yr f/u since July 2018. Referral made.  Last MGM - 07/11/2015 - overdue & daughter Claiborne Billings) to schedule.  Past Surgical History:  Procedure Laterality Date  . ABDOMINAL HYSTERECTOMY    . CARPAL TUNNEL RELEASE Bilateral 2001  . EYE SURGERY    . NASAL SEPTUM SURGERY    . TUBAL LIGATION     Family History  Problem Relation Age of Onset  . Hypertension Mother   . Hyperlipidemia Mother   . Heart disease Father   . Alcohol abuse Father   . Hyperlipidemia Father   . Hypertension Father   . Heart disease Sister   . Stroke Sister   . Depression Sister   . Heart disease Brother    Social History   Tobacco Use  . Smoking status: Former Smoker    Last attempt to quit: 03/20/1988    Years since quitting: 29.6  . Smokeless tobacco: Never Used  Substance Use Topics  . Alcohol use: Yes    Comment: rare glass of wine  . Drug use: Not on file    ROS Constitutional: Denies fever, chills, weight loss/gain, headaches, insomnia,  night sweats, and change in appetite. Does c/o fatigue. Eyes: Denies redness, blurred vision, diplopia, discharge, itchy, watery eyes.  ENT: Denies discharge, congestion, post nasal drip, epistaxis, sore throat, earache, hearing loss, dental pain, Tinnitus, Vertigo, Sinus pain, snoring.  Cardio: Denies chest pain, palpitations, irregular heartbeat, syncope, dyspnea, diaphoresis, orthopnea, PND, claudication, edema Respiratory: denies cough, dyspnea, DOE, pleurisy, hoarseness, laryngitis, wheezing.  Gastrointestinal: Denies dysphagia, heartburn, reflux, water brash, pain, cramps, nausea,  vomiting, bloating, diarrhea, constipation, hematemesis, melena, hematochezia, jaundice, hemorrhoids Genitourinary: Denies dysuria, frequency, urgency, nocturia, hesitancy, discharge, hematuria, flank pain Breast: Breast lumps, nipple discharge, bleeding.  Musculoskeletal: Denies arthralgia, myalgia, stiffness, Jt. Swelling, pain, limp, and strain/sprain. Denies falls. Skin: Denies puritis, rash, hives, warts, acne, eczema, changing in skin lesion Neuro: No weakness, tremor, incoordination, spasms, paresthesia, pain Psychiatric: Denies confusion, memory loss, sensory loss. Denies Depression. Endocrine: Denies change in weight, skin, hair change, nocturia, and paresthesia, diabetic polys, visual blurring, hyper / hypo glycemic episodes.  Heme/Lymph: No excessive bleeding, bruising, enlarged lymph nodes.  Physical Exam  BP 126/76   Pulse 64   Temp (!) 97.2 F (36.2 C)   Resp 16   Ht _0  (1.575 m)   Wt 117 lb (53.1 kg)   BMI 21.40 kg/m   General Appearance: Well nourished, well groomed and in no apparent distress.  Eyes: PERRLA, EOMs, conjunctiva no swelling or erythema, normal  fundi and vessels. Sinuses: No frontal/maxillary tenderness ENT/Mouth: EACs patent / TMs  nl. Nares clear without erythema, swelling, mucoid exudates. Oral hygiene is good. No erythema, swelling, or exudate. Tongue normal, non-obstructing. Tonsils not swollen or erythematous. Hearing normal.  Neck: Supple, thyroid not palpable. No bruits, nodes or JVD. Respiratory: Respiratory effort normal.  BS equal and clear bilateral without rales, rhonci, wheezing or stridor. Cardio: Heart sounds are normal with regular rate and rhythm and no murmurs, rubs or gallops. Peripheral pulses are normal and equal bilaterally without edema. No aortic or femoral bruits. Chest: symmetric with normal excursions and percussion. Breasts: Symmetric, without lumps, nipple discharge, retractions, or fibrocystic changes.  Abdomen: Flat,  soft with bowel sounds active. Nontender, no guarding, rebound, hernias, masses, or organomegaly.  Lymphatics: Non tender without lymphadenopathy.  Genitourinary:  Musculoskeletal: Full ROM all peripheral extremities, joint stability, 5/5 strength, and normal gait. Skin: Warm and dry without rashes, lesions, cyanosis, clubbing or  ecchymosis.  Neuro: Cranial nerves intact, reflexes equal bilaterally. Normal muscle tone, no cerebellar symptoms. Sensation intact.  Pysch: Alert and oriented X 3, normal affect, Insight and Judgment appropriate.   Assessment and Plan  1. Annual Preventative Screening Examination  2. Essential hypertension  - Urinalysis, Routine w reflex microscopic - Microalbumin / creatinine urine ratio - CBC with Differential/Platelet - COMPLETE METABOLIC PANEL WITH GFR - Magnesium - TSH - EKG 12-Lead  3. Hyperlipidemia, mixed  - Lipid panel - TSH - EKG 12-Lead  4. Abnormal glucose  - Hemoglobin A1c - Insulin, random - EKG 12-Lead  5. Vitamin D deficiency  - VITAMIN D 25 Hydroxyl  6. Hypothyroidism  - TSH  7. Prediabetes  - Hemoglobin A1c - Insulin, random  8. Aortic atherosclerosis (HCC)  - EKG 12-Lead  9. Gastroesophageal reflux disease  - CBC with Differential/Platelet  10. Vascular dementia without behavioral disturbance ([place])  - Patient is advised to stop her hs Alprazolam.  - Vitamin B12  11. Vitamin B12 deficiency  - Vitamin B12  12. Anemia, unspecified type  - CBC with Differential/Platelet  13. Screening for colorectal cancer  - POC Hemoccult Bld/Stl   14. Screening for ischemic heart disease  - EKG 12-Lead  15. FHx: heart disease  - EKG 12-Lead  16. Former smoker  - EKG 12-Lead  17. Medication management  - Urinalysis, Routine w reflex microscopic - Microalbumin / creatinine urine ratio - CBC with Differential/Platelet - COMPLETE METABOLIC PANEL WITH GFR - Magnesium - Lipid panel - TSH - Hemoglobin  A1c - Insulin, random - VITAMIN D 25 Hydroxy - EKG 12-Lead       Patient was counseled in prudent diet to achieve/maintain BMI less than 25 for weight control, BP monitoring, regular exercise and medications. Discussed med's effects and SE's. Screening labs and tests as requested with regular follow-up as recommended. Over 40 minutes of exam, counseling, chart review and high complex critical decision making was performed.

## 2017-11-11 NOTE — Patient Instructions (Signed)
We Do NOT Approve of  [facility] [place] Soliciting Our Patients  To Do Home Visits & We Do NOT Approve of [facility] > > > > > > > > > > > > > > > > > > > > > > > > > > > > > > > > > > > > > > >  Preventive Care for Adults  A healthy lifestyle and preventive care can promote health and wellness. Preventive health guidelines for women include the following key practices.  A routine yearly physical is a good way to check with your health care provider about your health and preventive screening. It is a chance to share any concerns and updates on your health and to receive a thorough exam.  Visit your dentist for a routine exam and preventive care every 6 months. Brush your teeth twice a day and floss once a day. Good oral hygiene prevents tooth decay and gum disease.  The frequency of eye exams is based on your age, health, family medical history, use of contact lenses, and other factors. Follow your health care provider's recommendations for frequency of eye exams.  Eat a healthy diet. Foods like vegetables, fruits, whole grains, low-fat dairy products, and lean protein foods contain the nutrients you need without too many calories. Decrease your intake of foods high in solid fats, added sugars, and salt. Eat the right amount of calories for you. Get information about a proper diet from your health care provider, if necessary.  Regular physical exercise is one of the most important things you can do for your health. Most adults should get at least 150 minutes of moderate-intensity exercise (any activity that increases your heart rate and causes you to sweat) each week. In addition, most adults need muscle-strengthening exercises on 2 or more days a week.  Maintain a healthy weight. The body mass index (BMI) is a screening tool to identify possible weight problems. It provides an estimate of body fat based on height and weight. Your health care provider can find your BMI  and can help you achieve or maintain a healthy weight. For adults 20 years and older:  A BMI below 18.5 is considered underweight.  A BMI of 18.5 to 24.9 is normal.  A BMI of 25 to 29.9 is considered overweight.  A BMI of 30 and above is considered obese.  Maintain normal blood lipids and cholesterol levels by exercising and minimizing your intake of saturated fat. Eat a balanced diet with plenty of fruit and vegetables. If your lipid or cholesterol levels are high, you are over 50, or you are at high risk for heart disease, you may need your cholesterol levels checked more frequently. Ongoing high lipid and cholesterol levels should be treated with medicines if diet and exercise are not working.  If you smoke, find out from your health care provider how to quit. If you do not use tobacco, do not start.  Lung cancer screening is recommended for adults aged 55-80 years who are at high risk for developing lung cancer because of a history of smoking. A yearly low-dose CT scan of the lungs is recommended for people who have at least a 30-pack-year history of smoking and are a current smoker or have quit within the past 15 years. A pack year of smoking is smoking an average of 1 pack of cigarettes a day for 1 year (for example: 1 pack a day for 30 years or 2 packs a day   for 15 years). Yearly screening should continue until the smoker has stopped smoking for at least 15 years. Yearly screening should be stopped for people who develop a health problem that would prevent them from having lung cancer treatment.  Avoid use of street drugs. Do not share needles with anyone. Ask for help if you need support or instructions about stopping the use of drugs.  High blood pressure causes heart disease and increases the risk of stroke.  Ongoing high blood pressure should be treated with medicines if weight loss and exercise do not work.  If you are 29-34 years old, ask your health care provider if you should take  aspirin to prevent strokes.  Diabetes screening involves taking a blood sample to check your fasting blood sugar level. This should be done once every 3 years, after age 27, if you are within normal weight and without risk factors for diabetes. Testing should be considered at a younger age or be carried out more frequently if you are overweight and have at least 1 risk factor for diabetes.  Breast cancer screening is essential preventive care for women. You should practice "breast self-awareness." This means understanding the normal appearance and feel of your breasts and may include breast self-examination. Any changes detected, no matter how small, should be reported to a health care provider. Women in their 64s and 30s should have a clinical breast exam (CBE) by a health care provider as part of a regular health exam every 1 to 3 years. After age 69, women should have a CBE every year. Starting at age 6, women should consider having a mammogram (breast X-ray test) every year. Women who have a family history of breast cancer should talk to their health care provider about genetic screening. Women at a high risk of breast cancer should talk to their health care providers about having an MRI and a mammogram every year.  Breast cancer gene (BRCA)-related cancer risk assessment is recommended for women who have family members with BRCA-related cancers. BRCA-related cancers include breast, ovarian, tubal, and peritoneal cancers. Having family members with these cancers may be associated with an increased risk for harmful changes (mutations) in the breast cancer genes BRCA1 and BRCA2. Results of the assessment will determine the need for genetic counseling and BRCA1 and BRCA2 testing.  Routine pelvic exams to screen for cancer are no longer recommended for nonpregnant women who are considered low risk for cancer of the pelvic organs (ovaries, uterus, and vagina) and who do not have symptoms. Ask your health  care provider if a screening pelvic exam is right for you.  If you have had past treatment for cervical cancer or a condition that could lead to cancer, you need Pap tests and screening for cancer for at least 20 years after your treatment. If Pap tests have been discontinued, your risk factors (such as having a new sexual partner) need to be reassessed to determine if screening should be resumed. Some women have medical problems that increase the chance of getting cervical cancer. In these cases, your health care provider may recommend more frequent screening and Pap tests.    Colorectal cancer can be detected and often prevented. Most routine colorectal cancer screening begins at the age of 21 years and continues through age 8 years. However, your health care provider may recommend screening at an earlier age if you have risk factors for colon cancer. On a yearly basis, your health care provider may provide home test kits to check  for hidden blood in the stool. Use of a small camera at the end of a tube, to directly examine the colon (sigmoidoscopy or colonoscopy), can detect the earliest forms of colorectal cancer. Talk to your health care provider about this at age 50, when routine screening begins.  Direct exam of the colon should be repeated every 5-10 years through age 75 years, unless early forms of pre-cancerous polyps or small growths are found.  Osteoporosis is a disease in which the bones lose minerals and strength with aging. This can result in serious bone fractures or breaks. The risk of osteoporosis can be identified using a bone density scan. Women ages 65 years and over and women at risk for fractures or osteoporosis should discuss screening with their health care providers. Ask your health care provider whether you should take a calcium supplement or vitamin D to reduce the rate of osteoporosis.  Menopause can be associated with physical symptoms and risks. Hormone replacement therapy  is available to decrease symptoms and risks. You should talk to your health care provider about whether hormone replacement therapy is right for you.  Use sunscreen. Apply sunscreen liberally and repeatedly throughout the day. You should seek shade when your shadow is shorter than you. Protect yourself by wearing long sleeves, pants, a wide-brimmed hat, and sunglasses year round, whenever you are outdoors.  Once a month, do a whole body skin exam, using a mirror to look at the skin on your back. Tell your health care provider of new moles, moles that have irregular borders, moles that are larger than a pencil eraser, or moles that have changed in shape or color.  Stay current with required vaccines (immunizations).  Influenza vaccine. All adults should be immunized every year.  Tetanus, diphtheria, and acellular pertussis (Td, Tdap) vaccine. Pregnant women should receive 1 dose of Tdap vaccine during each pregnancy. The dose should be obtained regardless of the length of time since the last dose. Immunization is preferred during the 27th-36th week of gestation. An adult who has not previously received Tdap or who does not know her vaccine status should receive 1 dose of Tdap. This initial dose should be followed by tetanus and diphtheria toxoids (Td) booster doses every 10 years. Adults with an unknown or incomplete history of completing a 3-dose immunization series with Td-containing vaccines should begin or complete a primary immunization series including a Tdap dose. Adults should receive a Td booster every 10 years.    Zoster vaccine. One dose is recommended for adults aged 60 years or older unless certain conditions are present.    Pneumococcal 13-valent conjugate (PCV13) vaccine. When indicated, a person who is uncertain of her immunization history and has no record of immunization should receive the PCV13 vaccine. An adult aged 19 years or older who has certain medical conditions and has not  been previously immunized should receive 1 dose of PCV13 vaccine. This PCV13 should be followed with a dose of pneumococcal polysaccharide (PPSV23) vaccine. The PPSV23 vaccine dose should be obtained at least 1 or more year(s) after the dose of PCV13 vaccine. An adult aged 19 years or older who has certain medical conditions and previously received 1 or more doses of PPSV23 vaccine should receive 1 dose of PCV13. The PCV13 vaccine dose should be obtained 1 or more years after the last PPSV23 vaccine dose.    Pneumococcal polysaccharide (PPSV23) vaccine. When PCV13 is also indicated, PCV13 should be obtained first. All adults aged 65 years and older should   be immunized. An adult younger than age 65 years who has certain medical conditions should be immunized. Any person who resides in a nursing home or long-term care facility should be immunized. An adult smoker should be immunized. People with an immunocompromised condition and certain other conditions should receive both PCV13 and PPSV23 vaccines. People with human immunodeficiency virus (HIV) infection should be immunized as soon as possible after diagnosis. Immunization during chemotherapy or radiation therapy should be avoided. Routine use of PPSV23 vaccine is not recommended for American Indians, [place] Natives, or people younger than 65 years unless there are medical conditions that require PPSV23 vaccine. When indicated, people who have unknown immunization and have no record of immunization should receive PPSV23 vaccine. One-time revaccination 5 years after the first dose of PPSV23 is recommended for people aged 19-64 years who have chronic kidney failure, nephrotic syndrome, asplenia, or immunocompromised conditions. People who received 1-2 doses of PPSV23 before age 65 years should receive another dose of PPSV23 vaccine at age 65 years or later if at least 5 years have passed since the previous dose. Doses of PPSV23 are not needed for people immunized  with PPSV23 at or after age 65 years.   Preventive Services / Frequency  Ages 65 years and over  Blood pressure check.  Lipid and cholesterol check.  Lung cancer screening. / Every year if you are aged 55-80 years and have a 30-pack-year history of smoking and currently smoke or have quit within the past 15 years. Yearly screening is stopped once you have quit smoking for at least 15 years or develop a health problem that would prevent you from having lung cancer treatment.  Clinical breast exam.** / Every year after age 40 years.   BRCA-related cancer risk assessment.** / For women who have family members with a BRCA-related cancer (breast, ovarian, tubal, or peritoneal cancers).  Mammogram.** / Every year beginning at age 40 years and continuing for as long as you are in good health. Consult with your health care provider.  Pap test.** / Every 3 years starting at age 30 years through age 65 or 70 years with 3 consecutive normal Pap tests. Testing can be stopped between 65 and 70 years with 3 consecutive normal Pap tests and no abnormal Pap or HPV tests in the past 10 years.  Fecal occult blood test (FOBT) of stool. / Every year beginning at age 50 years and continuing until age 75 years. You may not need to do this test if you get a colonoscopy every 10 years.  Flexible sigmoidoscopy or colonoscopy.** / Every 5 years for a flexible sigmoidoscopy or every 10 years for a colonoscopy beginning at age 50 years and continuing until age 75 years.  Hepatitis C blood test.** / For all people born from 1945 through 1965 and any individual with known risks for hepatitis C.  Osteoporosis screening.** / A one-time screening for women ages 65 years and over and women at risk for fractures or osteoporosis.  Skin self-exam. / Monthly.  Influenza vaccine. / Every year.  Tetanus, diphtheria, and acellular pertussis (Tdap/Td) vaccine.** / 1 dose of Td every 10 years.  Zoster vaccine.** / 1 dose  for adults aged 60 years or older.  Pneumococcal 13-valent conjugate (PCV13) vaccine.** / Consult your health care provider.  Pneumococcal polysaccharide (PPSV23) vaccine.** / 1 dose for all adults aged 65 years and older. Screening for abdominal aortic aneurysm (AAA)  by ultrasound is recommended for people who have history of high blood pressure   or who are current or former smokers. ++++++++++++++++++++ Recommend Adult Low Dose Aspirin or  coated  Aspirin 81 mg daily  To reduce risk of Colon Cancer 20 %,  Skin Cancer 26 % ,  Melanoma 46%  and  Pancreatic cancer 60% ++++++++++++++++++++ Vitamin D goal  is between 70-100.  Please make sure that you are taking your Vitamin D as directed.  It is very important as a natural anti-inflammatory  helping hair, skin, and nails, as well as reducing stroke and heart attack risk.  It helps your bones and helps with mood. It also decreases numerous cancer risks so please take it as directed.  Low Vit D is associated with a 200-300% higher risk for CANCER  and 200-300% higher risk for HEART   ATTACK  &  STROKE.   ...................................... It is also associated with higher death rate at younger ages,  autoimmune diseases like Rheumatoid arthritis, Lupus, Multiple Sclerosis.    Also many other serious conditions, like depression, Alzheimer's Dementia, infertility, muscle aches, fatigue, fibromyalgia - just to name a few. ++++++++++++++++++ Recommend the book "The END of DIETING" by Dr Joel Fuhrman  & the book "The END of DIABETES " by Dr Joel Fuhrman At [employer] - get book & Audio CD's    Being diabetic has a  300% increased risk for heart attack, stroke, cancer, and alzheimer- type vascular dementia. It is very important that you work harder with diet by avoiding all foods that are white. Avoid white rice (brown & wild rice is OK), white potatoes (sweetpotatoes in moderation is OK), White bread or wheat bread or anything made out of  white flour like bagels, donuts, rolls, buns, biscuits, cakes, pastries, cookies, pizza crust, and pasta (made from white flour & egg whites) - vegetarian pasta or spinach or wheat pasta is OK. Multigrain breads like Arnold's or Pepperidge [employer], or multigrain sandwich thins or flatbreads.  Diet, exercise and weight loss can reverse and cure diabetes in the early stages.  Diet, exercise and weight loss is very important in the control and prevention of complications of diabetes which affects every system in your body, ie. Brain - dementia/stroke, eyes - glaucoma/blindness, heart - heart attack/heart failure, kidneys - dialysis, stomach - gastric paralysis, intestines - malabsorption, nerves - severe painful neuritis, circulation - gangrene & loss of a leg(s), and finally cancer and Alzheimers.    I recommend avoid fried & greasy foods,  sweets/candy, white rice (brown or wild rice or Quinoa is OK), white potatoes (sweet potatoes are OK) - anything made from white flour - bagels, doughnuts, rolls, buns, biscuits,white and wheat breads, pizza crust and traditional pasta made of white flour & egg white(vegetarian pasta or spinach or wheat pasta is OK).  Multi-grain bread is OK - like multi-grain flat bread or sandwich thins. Avoid alcohol in excess. Exercise is also important.    Eat all the vegetables you want - avoid meat, especially red meat and dairy - especially cheese.  Cheese is the most concentrated form of trans-fats which is the worst thing to clog up our arteries. Veggie cheese is OK which can be found in the fresh produce section at [employer] or [employer] or [employer]  +++++++++++++++++++ DASH Eating Plan  DASH stands for "Dietary Approaches to Stop Hypertension."   The DASH eating plan is a healthy eating plan that has been shown to reduce high blood pressure (hypertension). Additional health benefits may include reducing the risk of type 2 diabetes mellitus, heart disease,   and stroke. The  DASH eating plan may also help with weight loss. WHAT DO I NEED TO KNOW ABOUT THE DASH EATING PLAN? For the DASH eating plan, you will follow these general guidelines:  Choose foods with a percent daily value for sodium of less than 5% (as listed on the food label).  Use salt-free seasonings or herbs instead of table salt or sea salt.  Check with your health care provider or pharmacist before using salt substitutes.  Eat lower-sodium products, often labeled as "lower sodium" or "no salt added."  Eat fresh foods.  Eat more vegetables, fruits, and low-fat dairy products.  Choose whole grains. Look for the word "whole" as the first word in the ingredient list.  Choose fish   Limit sweets, desserts, sugars, and sugary drinks.  Choose heart-healthy fats.  Eat veggie cheese   Eat more home-cooked food and less restaurant, buffet, and fast food.  Limit fried foods.  Cook foods using methods other than frying.  Limit canned vegetables. If you do use them, rinse them well to decrease the sodium.  When eating at a restaurant, ask that your food be prepared with less salt, or no salt if possible.                      WHAT FOODS CAN I EAT? Read Dr Fara Olden Fuhrman's books on The End of Dieting & The End of Diabetes  Grains Whole grain or whole wheat bread. Brown rice. Whole grain or whole wheat pasta. Quinoa, bulgur, and whole grain cereals. Low-sodium cereals. Corn or whole wheat flour tortillas. Whole grain cornbread. Whole grain crackers. Low-sodium crackers.  Vegetables Fresh or frozen vegetables (raw, steamed, roasted, or grilled). Low-sodium or reduced-sodium tomato and vegetable juices. Low-sodium or reduced-sodium tomato sauce and paste. Low-sodium or reduced-sodium canned vegetables.   Fruits All fresh, canned (in natural juice), or frozen fruits.  Protein Products  All fish and seafood.  Dried beans, peas, or lentils. Unsalted nuts and seeds. Unsalted canned  beans.  Dairy Low-fat dairy products, such as skim or 1% milk, 2% or reduced-fat cheeses, low-fat ricotta or cottage cheese, or plain low-fat yogurt. Low-sodium or reduced-sodium cheeses.  Fats and Oils Tub margarines without trans fats. Light or reduced-fat mayonnaise and salad dressings (reduced sodium). Avocado. Safflower, olive, or canola oils. Natural peanut or almond butter.  Other Unsalted popcorn and pretzels. The items listed above may not be a complete list of recommended foods or beverages. Contact your dietitian for more options.  +++++++++++++++  WHAT FOODS ARE NOT RECOMMENDED? Grains/ White flour or wheat flour White bread. White pasta. White rice. Refined cornbread. Bagels and croissants. Crackers that contain trans fat.  Vegetables  Creamed or fried vegetables. Vegetables in a . Regular canned vegetables. Regular canned tomato sauce and paste. Regular tomato and vegetable juices.  Fruits Dried fruits. Canned fruit in light or heavy syrup. Fruit juice.  Meat and Other Protein Products Meat in general - RED meat & White meat.  Fatty cuts of meat. Ribs, chicken wings, all processed meats as bacon, sausage, bologna, salami, fatback, hot dogs, bratwurst and packaged luncheon meats.  Dairy Whole or 2% milk, cream, half-and-half, and cream cheese. Whole-fat or sweetened yogurt. Full-fat cheeses or blue cheese. Non-dairy creamers and whipped toppings. Processed cheese, cheese spreads, or cheese curds.  Condiments Onion and garlic salt, seasoned salt, table salt, and sea salt. Canned and packaged gravies. Worcestershire sauce. Tartar sauce. Barbecue sauce. Teriyaki sauce. Soy sauce, including reduced  sodium. Steak sauce. Fish sauce. Oyster sauce. Cocktail sauce. Horseradish. Ketchup and mustard. Meat flavorings and tenderizers. Bouillon cubes. Hot sauce. Tabasco sauce. Marinades. Taco seasonings. Relishes.  Fats and Oils Butter, stick margarine, lard, shortening and bacon  fat. Coconut, palm kernel, or palm oils. Regular salad dressings.  Pickles and olives. Salted popcorn and pretzels.  The items listed above may not be a complete list of foods and beverages to avoid.  ++++++++++++++++++++++++++++++++++ Mild Neurocognitive Disorder Mild neurocognitive disorder (formerly known as mild cognitive impairment) is a mental disorder. It is a slight abnormal decrease in mental function. The areas of mental function affected may include memory, thought, communication, behavior, and completion of tasks. The decrease is noticeable and measurable but for the most part does not interfere with your daily activities. Mild neurocognitive disorder typically occurs in people older than 60 years but can occur earlier. It is not as serious as major neurocognitive disorder (formerly known as dementia) but may lead to a more serious neurocognitive disorder. However, in some cases the condition does not get worse. A few people with this disorder even improve. What are the causes? There are a number of different causes of mild neurocognitive disorder:  Brain disorders associated with abnormal protein deposits, such as Alzheimer's disease, Pick's disease, and Lewy body disease.  Brain disorders associated with abnormal movement, such as Parkinson's disease and Huntington's disease.  Diseases affecting blood vessels in the brain and resulting in mini-strokes.  Certain infections, such as human immunodeficiency virus (HIV) infection.  Traumatic brain injury.  Other medical conditions such as brain tumors, underactive thyroid (hypothyroidism), and vitamin B12 deficiency.  Use of certain prescription medicine and "recreational" drugs.  What are the signs or symptoms? Symptoms of mild neurocognitive disorder include:  Difficulty remembering. You may forget details of recent events, names, or phone numbers. You may forget important social events and appointments or repeatedly forget  where you put your car keys.  Difficulty thinking and solving problems. You may have trouble with complex tasks such as paying bills or driving in unfamiliar locations.  Difficulty communicating. You may have trouble finding the right word, naming an object, forming a sentence that makes sense, or understanding what you read or hear.  Changes in your behavior or personality. You may lose interest in the things that you used to enjoy or withdraw from social situations. You may get angry more easily than usual. You may act before thinking. You may do things in public that you would not usually do. You may hear or see things that are not real (hallucinations). You may believe falsely that others are trying to hurt you (paranoia).  How is this diagnosed? Mild neurocognitive disorder is diagnosed through an assessment by your health care provider. Your health care provider will ask you and your family, friends, or coworkers questions about your symptoms. He or she will ask how often the symptoms occur, how long they have been occurring, whether they are getting worse, and the effect they are having on your life. Your health care provider may refer you to a neurologist or mental health specialist for a detailed evaluation of your mental functions (neuropsychological testing). To identify the cause of your mild neurocognitive disorder, your health care provider may:  Obtain a detailed medical history.  Ask about alcohol and drug use, including prescription medicine.  Perform a physical exam.  Order blood tests and brain imaging exams.  How is this treated? Mild neurocognitive disorder caused by infections, use of certain medicines  or "recreational" drugs, and certain medical conditions may improve with treatment of the condition that is causing the disorder. Mild neurocognitive disorder resulting from other causes generally does not improve and may worsen. In these cases, the goal of treatment is to  slow progression of the disorder and help you cope with the loss of mental function. Treatments in these cases include:  Medicine. Medicine helps mainly with memory loss and behavioral symptoms.  Talk therapy. Talk therapy provides education, emotional support, memory aids, and other ways of making up for decreases in mental function.  Lifestyle changes. These include regular exercise, a healthy diet (including essential omega-3 fatty acids), intellectual stimulation, and increased social interaction.  +++++++++++++++++++++++++++++++++++  Cognitive Tips  Keep a journal/notebook with sections for the following (or use sections separately as needed):  Calendar and appointment sheet, schedule for each day, lists of reminders (such as grocery lists or "to do" list), homework assignments for therapy, important information such as family and friends names / addresses / phone numbers, medications, medical history and doctors name / phone numbers.  Avoid / remove clutter and unnecessary items from areas such as countertops / cabinets in kitchen and bathroom, closets, etc.  Organize items by purpose.  Baskets and bins help with this.  Leave notes for reminders above task to be completed.  For example: Note to turn off stove over the the stove; note to lock door beside the door, not to brush teeth then wash face by sink, note to take medication on table etc.  To help recall names of people of people or things, mentally or verbally go through the alphabet to try to determine the 1st letter of the word as this may trigger the name or word you are looking for.  If this is too difficult and someone else knows the word, have them give you the first letter by asking, "does it start with an "A", "B", "C" etc. (or have them give you the first sound of the word or some other clue).  Review family events, occasions, names, etc.  Pictures are a good way to trigger memory.  Have others correct you if you answer  something incorrectly.  Have them speak slowly with a few words to give you time to process and respond.  Don't let others automatically problem-solve. (For example: don't let them automatically lay out clothes in the correct position, but hand it to you folded so that you can figure it out for yourself.)  However, if you need help with tasks, they should give you as little as they can so that you can be successful.  If appropriate and safe, they may allow you to make mistakes so that you can figure out how to correct the error.  (For example, they may allow you to put your shoes on the wrong foot to see if you notice that is wrong).  If you struggle, they should give you a cue.  (Example: "Do your shoes feel right?"  "Do they look right?")

## 2017-11-12 LAB — CBC WITH DIFFERENTIAL/PLATELET
Basophils Absolute: 28 cells/uL (ref 0–200)
Basophils Relative: 0.4 %
EOS PCT: 2.6 %
Eosinophils Absolute: 179 cells/uL (ref 15–500)
HEMATOCRIT: 38.5 % (ref 35.0–45.0)
Hemoglobin: 12.9 g/dL (ref 11.7–15.5)
LYMPHS ABS: 2650 {cells}/uL (ref 850–3900)
MCH: 29.9 pg (ref 27.0–33.0)
MCHC: 33.5 g/dL (ref 32.0–36.0)
MCV: 89.1 fL (ref 80.0–100.0)
MPV: 10.3 fL (ref 7.5–12.5)
Monocytes Relative: 6.1 %
NEUTROS PCT: 52.5 %
Neutro Abs: 3623 cells/uL (ref 1500–7800)
PLATELETS: 170 10*3/uL (ref 140–400)
RBC: 4.32 10*6/uL (ref 3.80–5.10)
RDW: 12.9 % (ref 11.0–15.0)
TOTAL LYMPHOCYTE: 38.4 %
WBC: 6.9 10*3/uL (ref 3.8–10.8)
WBCMIX: 421 {cells}/uL (ref 200–950)

## 2017-11-12 LAB — LIPID PANEL
CHOLESTEROL: 191 mg/dL (ref <200)
HDL: 65 mg/dL (ref >50)
LDL CHOLESTEROL (CALC): 104 mg/dL — AB
Non-HDL Cholesterol (Calc): 126 mg/dL (calc) (ref <130)
TRIGLYCERIDES: 122 mg/dL (ref <150)
Total CHOL/HDL Ratio: 2.9 (calc) (ref <5.0)

## 2017-11-12 LAB — COMPLETE METABOLIC PANEL WITH GFR
AG Ratio: 1.8 (calc) (ref 1.0–2.5)
ALT: 11 U/L (ref 6–29)
AST: 26 U/L (ref 10–35)
Albumin: 4.8 g/dL (ref 3.6–5.1)
Alkaline phosphatase (APISO): 51 U/L (ref 33–130)
BUN/Creatinine Ratio: 11 (calc) (ref 6–22)
BUN: 19 mg/dL (ref 7–25)
CO2: 34 mmol/L — ABNORMAL HIGH (ref 20–32)
Calcium: 10.5 mg/dL — ABNORMAL HIGH (ref 8.6–10.4)
Chloride: 98 mmol/L (ref 98–110)
Creat: 1.73 mg/dL — ABNORMAL HIGH (ref 0.60–0.93)
GFR, Est African American: 33 mL/min/{1.73_m2} — ABNORMAL LOW (ref >=60)
GFR, Est Non African American: 29 mL/min/{1.73_m2} — ABNORMAL LOW (ref >=60)
Globulin: 2.7 g/dL (calc) (ref 1.9–3.7)
Glucose, Bld: 97 mg/dL (ref 65–99)
Potassium: 4.3 mmol/L (ref 3.5–5.3)
Sodium: 142 mmol/L (ref 135–146)
Total Bilirubin: 0.7 mg/dL (ref 0.2–1.2)
Total Protein: 7.5 g/dL (ref 6.1–8.1)

## 2017-11-12 LAB — URINALYSIS, ROUTINE W REFLEX MICROSCOPIC
BILIRUBIN URINE: NEGATIVE
GLUCOSE, UA: NEGATIVE
HGB URINE DIPSTICK: NEGATIVE
Hyaline Cast: NONE SEEN /LPF
Ketones, ur: NEGATIVE
LEUKOCYTES UA: NEGATIVE
NITRITE: NEGATIVE
Specific Gravity, Urine: 1.009 (ref 1.001–1.03)
pH: 5.5 (ref 5.0–8.0)

## 2017-11-12 LAB — HEMOGLOBIN A1C
Hgb A1c MFr Bld: 5.9 % of total Hgb — ABNORMAL HIGH (ref <5.7)
Mean Plasma Glucose: 123 (calc)
eAG (mmol/L): 6.8 (calc)

## 2017-11-12 LAB — MAGNESIUM: Magnesium: 2.3 mg/dL (ref 1.5–2.5)

## 2017-11-12 LAB — MICROALBUMIN / CREATININE URINE RATIO
Creatinine, Urine: 57 mg/dL (ref 20–275)
Microalb Creat Ratio: 54 mcg/mg creat — ABNORMAL HIGH (ref <30)
Microalb, Ur: 3.1 mg/dL

## 2017-11-12 LAB — TSH: TSH: 3.32 m[IU]/L (ref 0.40–4.50)

## 2017-11-12 LAB — VITAMIN D 25 HYDROXY (VIT D DEFICIENCY, FRACTURES): Vit D, 25-Hydroxy: 61 ng/mL (ref 30–100)

## 2017-11-12 LAB — INSULIN, RANDOM: INSULIN: 8.5 u[IU]/mL (ref 2.0–19.6)

## 2017-11-12 LAB — VITAMIN B12: Vitamin B-12: 1184 pg/mL — ABNORMAL HIGH (ref 200–1100)

## 2017-11-14 ENCOUNTER — Other Ambulatory Visit: Payer: Self-pay | Admitting: Internal Medicine

## 2017-11-14 MED ORDER — ESTRADIOL 0.5 MG PO TABS: ORAL_TABLET | ORAL | 3 refills | Status: DC

## 2017-11-15 ENCOUNTER — Other Ambulatory Visit: Payer: Self-pay | Admitting: Internal Medicine

## 2017-11-15 DIAGNOSIS — Z1231 Encounter for screening mammogram for malignant neoplasm of breast: Secondary | ICD-10-CM

## 2017-11-19 ENCOUNTER — Encounter: Payer: Self-pay | Admitting: Internal Medicine

## 2017-11-22 ENCOUNTER — Other Ambulatory Visit: Payer: Self-pay | Admitting: Internal Medicine

## 2017-11-24 ENCOUNTER — Encounter: Payer: Self-pay | Admitting: Neurology

## 2017-11-24 ENCOUNTER — Ambulatory Visit: Payer: Medicare HMO | Admitting: Neurology

## 2017-11-24 VITALS — BP 157/82 | HR 63 | Ht 61.0 in | Wt 116.0 lb

## 2017-11-24 DIAGNOSIS — G301 Alzheimer's disease with late onset: Secondary | ICD-10-CM | POA: Diagnosis not present

## 2017-11-24 DIAGNOSIS — F0281 Dementia in other diseases classified elsewhere with behavioral disturbance: Secondary | ICD-10-CM

## 2017-11-24 DIAGNOSIS — F02818 Dementia in other diseases classified elsewhere, unspecified severity, with other behavioral disturbance: Secondary | ICD-10-CM

## 2017-11-24 MED ORDER — DONEPEZIL HCL 10 MG PO TABS: ORAL_TABLET | ORAL | 3 refills | Status: DC

## 2017-11-24 NOTE — Patient Instructions (Addendum)
Aricept (generic name: donepezil) 10 mg: take 1/2 pill each evening for 2 weeks, then 1 pill each evening.  Common side effects may include dry eyes, dry mouth, confusion, low pulse, low blood pressure, also GI related side effects (nausea, vomiting, diarrhea, constipation), headaches; rare side effects may include hallucinations and seizures.   Based on your history and your exam as well as your formal memory evaluation, you are not safe to drive any longer.

## 2017-11-24 NOTE — Progress Notes (Signed)
Subjective:    Patient ID: Stacy Wallace is a 72 y.o. female.  HPI     Interim history:   Stacy Wallace is a 72 year old right-handed woman with an underlying medical history of hypertension, hypothyroidism, vitamin D deficiency, hyperlipidemia, reflux disease, vitamin B12 deficiency, and insomnia, who presents for follow-up consultation of her memory loss. Patient is accompanied by her daughter today. I first met her on 04/28/2017 at the request of her primary care physician, at which time the patient reported forgetfulness for years. Her daughter reported an approximately eight-year history of memory loss especially after her retirement at age 72. Her MMSE was 16 out of 30 at the time. I suggested further workup in the form of brain MRI and neuropsychological evaluation.  She had a brain MRI without contrast on 05/07/2017 and I reviewed the results: IMPRESSION: This MRI of the brain without contrast shows the following: 1.   Mild generalized cortical atrophy.  2.   Some scattered T2/FLAIR hyperintense foci in the hemispheres and left cerebellum.   This is nonspecific and could be due to chronic microvascular ischemic change, demyelination or sequela of migraine or inflammation. 3.   There are no acute findings.  We called her daughter with her test results.  She had interim evaluation with neuropsychology, saw Dr. 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 Results of cognitive testing were abnormal and demonstrated significant decline in several areas of cognitive function relative to estimated baseline. Additionally, there is evidence that her cognitive deficits are interfering with her ability to manage complex ADLs (driving, finances, appointments). As such, diagnostic criteria for a dementia syndrome are met.  The  patient's cognitive profile is notable for prominent impairment in temporal orientation, memory encoding and consolidation, semantic fluency, auditory comprehension and some aspects of executive functioning. Her clinical features and cognitive profile are most in line with Alzheimer's disease. It is possible that long term Xanax use has exacerbated underlying cognitive dysfunction. I would characterize stage of dementia as mild to moderate at the present time. She denies depression or anxiety symptoms but there does seem to be at least some mild adjustment related depression likely secondary to lack of social interaction. She also appears to be experiencing some mild paranoid ideation (suspicious of her husband helping her manage finances), which appears to be a neuropsychiatric feature of her dementia.       Recommendations/Plan: Based on the findings of the present evaluation, the following recommendations are offered:   1. From a neuropsychological perspective, the patient appears to be an appropriate candidate for cholinesterase inhibitor therapy. She will follow up with Dr. Rexene Alberts about this. 2. I discussed with the patient and her family the risks of long-term alprazolam use and the risk of confusion and falls in older adults due to anticholinergic properties. I would recommend slowly tapering off the medication if possible. The patient has been taking this medication for decades and is likely physiologically dependent. Antidepressant therapy (eg SSRI) does not have the same risk of cognitive impairment and may be useful in assisting the patient coming off alprazolam. Of course, all decisions with respect to medications are deferred to her physicians.  3. The patient's performance on a cognitive test highly correlated with driving ability was severely impaired. For the safety of the patient and other motorists, it is my recommendation that she discontinue driving  at this time. I had a long discussion  with the patient/family about this; the patient is adamant that she can and will continue to drive.  4. She should have assistance with all complex ADLs (finances, appointments, transportation, medications). 5. She is vulnerable to undue influence and financial exploitation, and all safeguards should be taken to reduce the risk of these occurring.  6. She would likely greatly benefit from increased social interaction, mental stimulation and physical exercise. These types of activity would help with brain health but also mood and quality of life. She could attend a day program or, if feasible, hire a private duty companion, I provided local resources for additional information. 7. The patient's family will benefit from caregiver support and education. I provided local resources and contact information.>>  Her daughter called earlier this month requesting that we reinforce that the patient no longer drive as she was observed by the daughter to not be completely safe to drive. I suggested we discussed this issue during the appointment and follow recommendations particularly made during the neuropsychology follow-up appointment.  Today, 11/24/2017: She reports feeling stable with regards to her memory. She has some forgetfulness. She had a recent follow-up appointment with her primary care physician and was advised to stop taking Xanax. This was also a recommendation from Dr. Bonita Quin. Her daughter reports the patient received a letter from the [facility] about her no longer recommended to drive. Daughter reports that patient was upset about this. Nevertheless, patient reports that she still drives to the grocery store. She has not been taking Xanax. She is sleeping fairly well. She has increased her water intake and is reminded by her family about her water intake. She had recent blood work earlier this month through primary care physician and was advised to make some adjustments to her dietary habits and increase  her water intake as her creatinine level had gone up. Her vitamin D level was good and she has stopped taking vitamin D.   The patient's allergies, current medications, family history, past medical history, past social history, past surgical history and problem list were reviewed and updated as appropriate.   Previously:   04/28/2017: (She) reports memory loss in terms of forgetfulness. She is not sure how long. She reports that it is probably normal to be forgetful at her age. Information was also provided by her daughter Stacy Wallace. She reports that approximately 8 year history of memory loss and her mom, particularly noticeable after she retired at age 19. She works for [employer]. In the past couple of years of memory loss has been much more pronounced. She has been repeating herself often, is more forgetful, has had some trouble driving including getting lost. She lives in a two-story home. She does not typically exercise but reports that she goes up and down the stairs a lot at her house. Her husband is 87 years old and still works. I reviewed your office note from 03/16/2017. She has had recent blood work through your office on 03/23/2017 which I reviewed. B12 level was in the normal range, the vitamin D level was normal, A1c was in the normal range, cholesterol level good, creatinine level 1.4. She is married and lives with her husband. She quit smoking in 1984, drinks alcohol occasionally, drinks caffeine daily. She does not always drink enough water according to the daughter. She has one son and one daughter, both live locally. She has no obvious family history of dementia. She had 4 brothers, 2 passed away,  the other 2 she does not have close contact with. She has 2 sisters, none with memory loss. She has lost weight slowly over the past few years, more stable in the past couple of years. She snores but daughter has never noted any pauses in her breathing.   Her Past Medical History Is  Significant For: Past Medical History:  Diagnosis Date  . GERD (gastroesophageal reflux disease)   . Hyperlipidemia   . Hypertension   . Other abnormal glucose   . Unspecified hypothyroidism   . Unspecified vitamin D deficiency     Her Past Surgical History Is Significant For: Past Surgical History:  Procedure Laterality Date  . ABDOMINAL HYSTERECTOMY    . CARPAL TUNNEL RELEASE Bilateral 2001  . EYE SURGERY    . NASAL SEPTUM SURGERY    . TUBAL LIGATION      Her Family History Is Significant For: Family History  Problem Relation Age of Onset  . Hypertension Mother   . Hyperlipidemia Mother   . Heart disease Father   . Alcohol abuse Father   . Hyperlipidemia Father   . Hypertension Father   . Heart disease Sister   . Stroke Sister   . Depression Sister   . Heart disease Brother     Her Social History Is Significant For: Social History   Socioeconomic History  . Marital status: Married    Spouse name: Not on file  . Number of children: Not on file  . Years of education: Not on file  . Highest education level: Not on file  Occupational History  . Not on file  Social Needs  . Financial resource strain: Not on file  . Food insecurity:    Worry: Not on file    Inability: Not on file  . Transportation needs:    Medical: Not on file    Non-medical: Not on file  Tobacco Use  . Smoking status: Former Smoker    Last attempt to quit: 03/20/1988    Years since quitting: 29.7  . Smokeless tobacco: Never Used  Substance and Sexual Activity  . Alcohol use: Yes    Comment: rare glass of wine  . Drug use: Not on file  . Sexual activity: Not on file  Lifestyle  . Physical activity:    Days per week: Not on file    Minutes per session: Not on file  . Stress: Not on file  Relationships  . Social connections:    Talks on phone: Not on file    Gets together: Not on file    Attends religious service: Not on file    Active member of club or organization: Not on file     Attends meetings of clubs or organizations: Not on file    Relationship status: Not on file  Other Topics Concern  . Not on file  Social History Narrative  . Not on file    Her Allergies Are:  Allergies  Allergen Reactions  . Lopid [Gemfibrozil]     headache  . Morphine And Related Other (See Comments)    Reaction: unknown   . Nickel Other (See Comments)    Reaction: unknown   . Latex Rash  :   Her Current Medications Are:  Outpatient Encounter Medications as of 11/24/2017  Medication Sig  . atenolol (TENORMIN) 100 MG tablet TAKE 1 TABLET EVERY DAY  . benazepril (LOTENSIN) 40 MG tablet Take tablet at night for BP  . BIOTIN PO Take 1 capsule by  mouth daily.  Marland Kitchen levothyroxine (SYNTHROID, LEVOTHROID) 50 MCG tablet TAKE 1 TABLET EVERY DAY BEFORE BREAKFAST  . Magnesium 500 MG TABS Take 500 mg by mouth daily.  . meloxicam (MOBIC) 15 MG tablet TAKE 1 TABLET EVERY DAY AS NEEDED FOR PAIN  . omeprazole (PRILOSEC) 20 MG capsule TAKE 1 CAPSULE EVERY DAY  . rosuvastatin (CRESTOR) 40 MG tablet TAKE 1 TABLET EVERY DAY  OR AS DIRECTED FOR CHOLESTEROL  . [DISCONTINUED] Cholecalciferol (D 5000) 5000 UNITS capsule Take 5,000 Units by mouth daily.  . [DISCONTINUED] estradiol (ESTRACE) 0.5 MG tablet Take 1 tablet daily  . [DISCONTINUED] furosemide (LASIX) 80 MG tablet TAKE 1 TABLET EVERY DAY   No facility-administered encounter medications on file as of 11/24/2017.   :  Review of Systems:  Out of a complete 14 point review of systems, all are reviewed and negative with the exception of these symptoms as listed below: Review of Systems  Neurological:       Pt presents today for follow up. Pt reports that her memory is stable and that she still drives to the grocery store.    Objective:  Neurological Exam  Physical Exam Physical Examination:   Vitals:   11/24/17 1347  BP: (!) 157/82  Pulse: 63    General Examination: The patient is a very pleasant 72 y.o. female in no acute  distress. She appears well-developed and well-nourished and well groomed.   HEENT: Normocephalic, atraumatic, pupils are equal, round and reactive to light and accommodation. Extraocular tracking is good without limitation to gaze excursion or nystagmus noted. Normal smooth pursuit is noted. Hearing is grossly intact. Face is symmetric with normal facial animation and normal facial sensation. Speech is clear with no dysarthria noted. There is no hypophonia. There is no lip, neck/head, jaw or voice tremor. Neck shows FROM. Oropharynx exam reveals: mild mouth dryness, adequate dental hygiene. Tongue protrudes centrally and palate elevates symmetrically.  Chest: Clear to auscultation without wheezing, rhonchi or crackles noted.  Heart: S1+S2+0, regular and normal without murmurs, rubs or gallops noted.   Abdomen: Soft, non-tender and non-distended with normal bowel sounds appreciated on auscultation.  Extremities: There is no pitting edema in the distal lower extremities bilaterally.   Skin: Warm and dry without trophic changes noted.  Musculoskeletal: exam reveals no obvious joint deformities, tenderness or joint swelling or erythema.   Neurologically:  Mental status: The patient is awake, alert and oriented to city, state and self. Her immediate and remote memory, attention, language skills and fund of knowledge are impaired. There is no evidence of aphasia, agnosia, apraxia or anomia. Speech is clear with normal prosody and enunciation. Thought process is linear. Mood is normal and affect is normal.   On 04/28/2017: MMSE: 16/30, CDT: 2/4, AFT: 5/min.  On 11/24/2017: MMSE: 20/30, CDT: 3/4, AFT: 6/min.  Cranial nerves II - XII are as described above under HEENT exam. In addition: shoulder shrug is normal with equal shoulder height noted. Motor exam: Normal bulk, strength and tone is noted. There is no tremor. Fine motor skills and coordination: intact grossly.   Cerebellar testing: No  dysmetria or intention tremor.  Sensory exam: intact to light touch.  Gait, station and balance: She stands easily. No veering to one side is noted. No leaning to one side is noted. Posture is age-appropriate and stance is narrow based. Gait shows normal stride length and normal pace. No problems turning are noted.  Assessment and Plan:    In summary, Stacy Wallace is a very pleasant 72 year old female with an underlying medical history of hypertension, hypothyroidism, vitamin D deficiency, hyperlipidemia, reflux disease, vitamin B12 deficiency, and insomnia, who presents for FU consultation of her memory loss. She has had a several year history of memory loss, more noticeable in the past couple of years. Her MMSE has been stable, actually mildly better today and work up in the interim has included MRI brain in May 2019 which showed mild generalized atrophy and microvascular changes, and neuropsychological evaluation with cognitive testing recently in September and a follow-up in October 2019. Her history and findings and neuropsychological test results are supportive of a diagnosis of Alzheimer's disease. Today, we talked about utilizing a dementia medication and she is amenable. She is no longer considered to be safe to drive. She is advised to no longer drive and I explained recommendation to her at length. She has been able to stop the Xanax thankfully. We talked about the importance of socialization, staying active physically and mentally and well-hydrated. I suggested we start her on generic Aricept 10 mg strength half a pill in the evening daily for 2 weeks and then increase to 1 pill daily thereafter. We talked about expectations, side effects and limitations of this medication. Of note, she does have a low normal pulse rate, I have advised them to monitor for low pulse rate and sedation, lightheadedness as well. I suggested follow-up in 3 months, sooner if needed. I answered all their  questions today and the patient and her daughter Stacy Wallace were in agreement. I spent 30 minutes in total face-to-face time with the patient, more than 50% of which was spent in counseling and coordination of care, reviewing test results, reviewing medication and discussing or reviewing the diagnosis of dementia, its prognosis and treatment options. Pertinent laboratory and imaging test results that were available during this visit with the patient were reviewed by me and considered in my medical decision making (see chart for details).

## 2017-12-06 DIAGNOSIS — R238 Other skin changes: Secondary | ICD-10-CM | POA: Diagnosis not present

## 2017-12-06 DIAGNOSIS — D485 Neoplasm of uncertain behavior of skin: Secondary | ICD-10-CM | POA: Diagnosis not present

## 2017-12-06 DIAGNOSIS — C44719 Basal cell carcinoma of skin of left lower limb, including hip: Secondary | ICD-10-CM | POA: Diagnosis not present

## 2017-12-08 DIAGNOSIS — C4499 Other specified malignant neoplasm of skin, unspecified: Secondary | ICD-10-CM | POA: Diagnosis not present

## 2017-12-10 ENCOUNTER — Other Ambulatory Visit: Payer: Self-pay | Admitting: Internal Medicine

## 2017-12-21 ENCOUNTER — Ambulatory Visit: Payer: Self-pay | Admitting: Adult Health

## 2017-12-27 ENCOUNTER — Ambulatory Visit
Admission: RE | Admit: 2017-12-27 | Discharge: 2017-12-27 | Disposition: A | Discharge status: Home / Self Care | Payer: Medicare HMO | Source: Ambulatory Visit | Attending: Internal Medicine | Admitting: Internal Medicine

## 2017-12-27 DIAGNOSIS — Z1231 Encounter for screening mammogram for malignant neoplasm of breast: Secondary | ICD-10-CM

## 2018-01-03 ENCOUNTER — Other Ambulatory Visit: Payer: Self-pay | Admitting: Adult Health

## 2018-01-06 ENCOUNTER — Encounter: Payer: Medicare HMO | Admitting: Internal Medicine

## 2018-01-26 DIAGNOSIS — C44799 Other specified malignant neoplasm of skin of left lower limb, including hip: Secondary | ICD-10-CM | POA: Diagnosis not present

## 2018-02-18 ENCOUNTER — Ambulatory Visit: Payer: Self-pay | Admitting: Physician Assistant

## 2018-02-21 NOTE — Progress Notes (Signed)
Patient ID: CHEVETTE FEE, female   DOB: [birth year], 73 y.o.   MRN: [id number]  MEDICARE ANNUAL WELLNESS VISIT AND FOLLOW UP  Assessment:   Essential hypertension - continue medications, DASH diet, exercise and monitor at home. Call if greater than 130/80.  -     CBC with Differential/Platelet -     BASIC METABOLIC PANEL WITH GFR -     Hepatic function panel -     TSH  Aortic atherosclerosis Control blood pressure, cholesterol, glucose, increase exercise.   Alzheimer disease Continue follow up with neuro Exercise at least 10 mins a day Stop xanax  Hypothyroidism, unspecified type Hypothyroidism-check TSH level, continue medications the same, reminded to take on an empty stomach 30-40mins before food.  -     TSH  Mixed hyperlipidemia -continue medications, check lipids, decrease fatty foods, increase activity.  -     Lipid panel  Prediabetes Discussed disease progression and risks Discussed diet/exercise, weight management and risk modification  Medication management -     Magnesium  Vitamin D deficiency -     VITAMIN D 25 Hydroxy (Vit-D Deficiency, Fractures)  Insomnia, unspecified type Insomnia- good sleep hygiene discussed, increase day time activity  DDD (degenerative disc disease), lumbar Suggest getting a pool/exercise  Gastroesophageal reflux disease, esophagitis presence not specified Continue PPI/H2 blocker, diet discussed  BMI 21.0-21.9, adult  Encounter for Medicare annual wellness exam Colonoscopy- patient declines a colonoscopy even though the risks and benefits were discussed at length. Colon cancer is 3rd most diagnosed cancer and 2nd leading cause of death in both men and women 78 years of age and older. Patient understands the risk of cancer and death with declining the test however they are willing to do cologuard screening instead. They understand that this is not as sensitive or specific as a colonoscopy and they are still recommended to get a  colonoscopy. The cologuard will be sent out to their house.   Anemia Check iron/ferritin  Over 30 minutes of exam, counseling, chart review, and critical decision making was performed  Future Appointments  Date Time Provider [place]  03/08/2018  2:00 PM Star Age, MD [facility] None  05/26/2018 11:00 AM Unk Pinto, MD [facility] None  10/10/2018  3:00 PM Unk Pinto, MD [facility] None  12/07/2018  2:00 PM Unk Pinto, MD [facility] None    Plan:   During the course of the visit the patient was educated and counseled about appropriate screening and preventive services including:    Pneumococcal vaccine   Influenza vaccine  Td vaccine  Prevnar 13  Screening electrocardiogram  Screening mammography  Bone densitometry screening  Colorectal cancer screening  Diabetes screening  Glaucoma screening  Nutrition counseling   Advanced directives: given info/requested copies   Subjective:   MIRRANDA MONRROY is a 73 y.o. female who presents for Medicare Annual Wellness Visit and 3 month follow up on hypertension, prediabetes, hyperlipidemia, vitamin D def.   Daughter is here, states she is cold a lot, wears a hoodie inside. She has memory issues, following with neurology.   She is on thyroid medication. Her medication was not changed last visit.   Lab Results  Component Value Date   TSH 3.32 11/11/2017  .   Her blood pressure has been controlled at home has been elevated at time, she is on atenolol 100mg  and benazepril 20mg  at night, lasix 80 mg 1/2 pill, today their BP is BP: 138/72 She does not workout. She denies chest pain, shortness of breath,  dizziness.  She is on cholesterol medication and denies myalgias. Her cholesterol is at goal. The cholesterol last visit was:   Lab Results  Component Value Date   CHOL 191 11/11/2017   HDL 65 11/11/2017   LDLCALC 104 (H) 11/11/2017   TRIG 122 11/11/2017   CHOLHDL 2.9 11/11/2017   Patient reports  that she has not been monitoring her diet.  She has a history of prediabetes which is diet controlled.  She has not symptoms of polydipsia, polyphagia, or polyuria.   Lab Results  Component Value Date   HGBA1C 5.9 (H) 11/11/2017   Last GFR Lab Results  Component Value Date   GFRNONAA 29 (L) 11/11/2017   Patient is on Vitamin D supplement. Lab Results  Component Value Date   VD25OH 61 11/11/2017     BMI is Body mass index is 21.09 kg/m., she is working on diet and exercise. Wt Readings from Last 3 Encounters:  02/22/18 111 lb 9.6 oz (50.6 kg)  11/24/17 116 lb (52.6 kg)  11/11/17 117 lb (53.1 kg)    Medication Review Current Outpatient Medications on File Prior to Visit  Medication Sig Dispense Refill  . atenolol (TENORMIN) 100 MG tablet TAKE 1 TABLET EVERY DAY 90 tablet 1  . benazepril (LOTENSIN) 40 MG tablet Take tablet at night for BP 90 tablet 3  . BIOTIN PO Take 1 capsule by mouth daily.    Marland Kitchen donepezil (ARICEPT) 10 MG tablet Take 1/2 pill each evening for 2 weeks, then 1 pill daily each evening thereafter. 90 tablet 3  . levothyroxine (SYNTHROID, LEVOTHROID) 50 MCG tablet TAKE 1 TABLET EVERY DAY BEFORE BREAKFAST 90 tablet 0  . Magnesium 500 MG TABS Take 500 mg by mouth daily.    . meloxicam (MOBIC) 15 MG tablet TAKE 1 TABLET EVERY DAY AS NEEDED FOR PAIN 90 tablet 1  . omeprazole (PRILOSEC) 20 MG capsule TAKE 1 CAPSULE EVERY DAY 90 capsule 3  . rosuvastatin (CRESTOR) 40 MG tablet TAKE 1 TABLET EVERY DAY  OR AS DIRECTED FOR CHOLESTEROL 90 tablet 1   No current facility-administered medications on file prior to visit.     Allergies: Allergies  Allergen Reactions  . Lopid [Gemfibrozil]     headache  . Morphine And Related Other (See Comments)    Reaction: unknown   . Nickel Other (See Comments)    Reaction: unknown   . Latex Rash    Current Problems (verified) has Hyperlipidemia; Hypertension; Hypothyroidism; Abnormal glucose; Vitamin D deficiency; GERD  (gastroesophageal reflux disease); Medication management; Insomnia; DDD (degenerative disc disease), lumbar; and Aortic atherosclerosis ([place]) on their problem list.  Screening Tests Immunization History  Administered Date(s) Administered  . Influenza, High Dose Seasonal PF 09/19/2013, 10/16/2014, 10/08/2015, 11/30/2016  . Influenza-Unspecified 09/15/2012, 11/05/2016, 11/05/2017  . Pneumococcal Conjugate-13 08/08/2015  . Pneumococcal-Unspecified 03/18/2010  . Td 11/09/2006    Preventative care: Last colonoscopy: 2008 normal, willing to do cologuard Last mammogram: 12/2017 DEXA: 2013 Left hip Xray 03/2013 MR spine 09/2015  Prior vaccinations: TD or Tdap: 2008 declines  Influenza: 2018 Pneumococcal: 2012 Prevnar13: 2017 Shingles/Zostavax: Declined   Names of Other Physician/Practitioners you currently use: 1. [facility]- here for primary care 2. Dr. Katy Fitch, eye doctor, 4-5 years ago 3. [place], dentist, last visit remote Patient Care Team: Unk Pinto, MD as PCP - General (Internal Medicine)  Surgical: She  has a past surgical history that includes Tubal ligation; Eye surgery; Abdominal hysterectomy; Carpal tunnel release (Bilateral, 2001); and  Nasal septum surgery. Family Her family history includes Alcohol abuse in her father; Depression in her sister; Heart disease in her brother, father, and sister; Hyperlipidemia in her father and mother; Hypertension in her father and mother; Stroke in her sister. Social history  She reports that she quit smoking about 29 years ago. She has never used smokeless tobacco. She reports current alcohol use. No history on file for drug.  MEDICARE WELLNESS OBJECTIVES: Physical activity: Current Exercise Habits: The patient does not participate in regular exercise at present Cardiac risk factors: Cardiac Risk Factors include: advanced age (>72men, >39 women);dyslipidemia;hypertension;sedentary  lifestyle Depression/mood screen:   Depression screen [facility] 2/9 02/22/2018  Decreased Interest 0  Down, Depressed, Hopeless 0  PHQ - 2 Score 0    ADLs:  In your present state of health, do you have any difficulty performing the following activities: 02/22/2018 11/11/2017  Hearing? N N  Vision? N N  Difficulty concentrating or making decisions? Y N  Walking or climbing stairs? N N  Dressing or bathing? N N  Doing errands, shopping? Y N  Preparing Food and eating ? N -  Using the Toilet? N -  In the past six months, have you accidently leaked urine? N -  Do you have problems with loss of bowel control? N -  Managing your Medications? N -  Managing your Finances? Y -  Housekeeping or managing your Housekeeping? N -  Some recent data might be hidden     Cognitive Testing  Alert? Yes  Normal Appearance?Yes  Oriented to person? Yes  Place? Yes   Time? No got month wrong  Recall of three objects?  2/3  Can perform simple calculations? Yes  Displays appropriate judgment?Yes  Can read the correct time from a watch face?Yes  EOL planning: Does Patient Have a Medical Advance Directive?: Yes Type of Advance Directive: Healthcare Power of Attorney, Living will Copy of [place] in Chart?: No - copy requested   Objective:   Today's Vitals   02/22/18 1050  BP: 138/72  Pulse: 60  Temp: 98.1 F (36.7 C)  SpO2: 98%  Weight: 111 lb 9.6 oz (50.6 kg)  Height: 5\' 1"  (1.549 m)   Body mass index is 21.09 kg/m.  General appearance: alert, no distress, WD/WN,  female HEENT: normocephalic, sclerae anicteric, TMs pearly, nares patent, no discharge or erythema, pharynx normal Oral cavity: MMM, no lesions Neck: supple, no lymphadenopathy, no thyromegaly, no masses Heart: RRR, normal S1, S2, no murmurs Lungs: CTA bilaterally, no wheezes, rhonchi, or rales Abdomen: +bs, soft, non tender, non distended, no masses, no hepatomegaly, no splenomegaly Musculoskeletal: nontender,  no swelling, no obvious deformity, no SI tenderness, neg straight leg.  Extremities: no edema, no cyanosis, no clubbing Pulses: 2+ symmetric, upper and lower extremities, normal cap refill Neurological: alert, oriented x 3, CN2-12 intact, strength normal upper extremities and lower extremities, sensation normal throughout, DTRs 2+ throughout, no cerebellar signs, normal finger to nose, some tongue fasiculations, gait normal Psychiatric: normal affect, behavior normal, pleasant  Breast: defer Gyn: defer Rectal: defer   Medicare Attestation I have personally reviewed: The patient's medical and social history Their use of alcohol, tobacco or illicit drugs Their current medications and supplements The patient's functional ability including ADLs,fall risks, home safety risks, cognitive, and hearing and visual impairment Diet and physical activities Evidence for depression or mood disorders  The patient's weight, height, BMI, and visual acuity have been recorded in the chart.  I have made referrals,  counseling, and provided education to the patient based on review of the above and I have provided the patient with a written personalized care plan for preventive services.     Vicie Mutters, PA-C   02/22/2018

## 2018-02-22 ENCOUNTER — Encounter: Payer: Self-pay | Admitting: Physician Assistant

## 2018-02-22 ENCOUNTER — Telehealth: Payer: Self-pay | Admitting: Physician Assistant

## 2018-02-22 ENCOUNTER — Ambulatory Visit (INDEPENDENT_AMBULATORY_CARE_PROVIDER_SITE_OTHER): Payer: Medicare HMO | Admitting: Physician Assistant

## 2018-02-22 VITALS — BP 138/72 | HR 60 | Temp 98.1°F | Ht 61.0 in | Wt 111.6 lb

## 2018-02-22 DIAGNOSIS — Z1211 Encounter for screening for malignant neoplasm of colon: Secondary | ICD-10-CM

## 2018-02-22 DIAGNOSIS — I10 Essential (primary) hypertension: Secondary | ICD-10-CM | POA: Diagnosis not present

## 2018-02-22 DIAGNOSIS — E559 Vitamin D deficiency, unspecified: Secondary | ICD-10-CM

## 2018-02-22 DIAGNOSIS — D649 Anemia, unspecified: Secondary | ICD-10-CM | POA: Diagnosis not present

## 2018-02-22 DIAGNOSIS — G4700 Insomnia, unspecified: Secondary | ICD-10-CM

## 2018-02-22 DIAGNOSIS — E039 Hypothyroidism, unspecified: Secondary | ICD-10-CM | POA: Diagnosis not present

## 2018-02-22 DIAGNOSIS — F0280 Dementia in other diseases classified elsewhere without behavioral disturbance: Secondary | ICD-10-CM

## 2018-02-22 DIAGNOSIS — G309 Alzheimer's disease, unspecified: Secondary | ICD-10-CM | POA: Insufficient documentation

## 2018-02-22 DIAGNOSIS — Z0001 Encounter for general adult medical examination with abnormal findings: Secondary | ICD-10-CM

## 2018-02-22 DIAGNOSIS — R6889 Other general symptoms and signs: Secondary | ICD-10-CM | POA: Diagnosis not present

## 2018-02-22 DIAGNOSIS — R7309 Other abnormal glucose: Secondary | ICD-10-CM | POA: Diagnosis not present

## 2018-02-22 DIAGNOSIS — M5136 Other intervertebral disc degeneration, lumbar region: Secondary | ICD-10-CM

## 2018-02-22 DIAGNOSIS — Z79899 Other long term (current) drug therapy: Secondary | ICD-10-CM

## 2018-02-22 DIAGNOSIS — G300 Alzheimer's disease with early onset: Secondary | ICD-10-CM

## 2018-02-22 DIAGNOSIS — E782 Mixed hyperlipidemia: Secondary | ICD-10-CM

## 2018-02-22 DIAGNOSIS — K219 Gastro-esophageal reflux disease without esophagitis: Secondary | ICD-10-CM

## 2018-02-22 DIAGNOSIS — Z0000 Encounter for general adult medical examination without abnormal findings: Secondary | ICD-10-CM

## 2018-02-22 DIAGNOSIS — I700 Atherosclerosis of aorta: Secondary | ICD-10-CM

## 2018-02-22 NOTE — Telephone Encounter (Signed)
-----   Message from Elenor Quinones, [place] sent at 02/22/2018  3:30 PM EST ----- Regarding: cologuard Would like an order for cologuard please

## 2018-02-22 NOTE — Patient Instructions (Addendum)
Make sure you are drinking 4 1/2 bottled water a day AT LEAST and that includes your coffee.   RANGE OF A1C   Your A1C is a measure of your sugar over the past 3 months and is not affected by what you have eaten over the past few days. Diabetes increases your chances of stroke and heart attack over 300 % and is the leading cause of blindness and kidney failure in the [place]. Please make sure you decrease bad carbs like white bread, white rice, potatoes, corn, soft drinks, pasta, cereals, refined sugars, sweet tea, dried fruits, and fruit juice. Good carbs are okay to eat in moderation like sweet potatoes, brown rice, whole grain pasta/bread, most fruit (except dried fruit) and you can eat as many veggies as you want.   Greater than 6.5 is considered diabetic. Between 6.4 and 5.7 is prediabetic If your A1C is less than 5.7 you are NOT diabetic.  Targets for Glucose Readings: Time of Check Target for patients WITHOUT Diabetes Target for DIABETICS  Before Meals Less than 100  less than 150  Two hours after meals Less than 200  Less than 250       Bad carbs also include fruit juice, alcohol, and sweet tea. These are empty calories that do not signal to your brain that you are full.   Please remember the good carbs are still carbs which convert into sugar. So please measure them out no more than 1/2-1 cup of rice, oatmeal, pasta, and beans  Veggies are however free foods! Pile them on.   Not all fruit is created equal. Please see the list below, the fruit at the bottom is higher in sugars than the fruit at the top. Please avoid all dried fruits.    COLOGUARD INFORMATION   Cologuard is an easy to use noninvasive colon cancer screening test based on the latest advances in stool DNA science.   Colon cancer is 3rd most diagnosed cancer and 2nd leading cause of death in both men and women 50 years of age and older despite being one of the most preventable and treatable cancers if found  early.  4 of out 5 people diagnosed with colon cancer have NO prior family history.  When caught EARLY 90% of colon cancer is curable.   More than 92% of cologuard patients have NO out of pocket cost for screening however only your insurer can confirm how Cologuard would be covered for you. [facility] has a team of specialist that can help you contact your insurer and ask the right questions. Please call [phone] so they can help.   You will receive a short call from [place] support center at [employer], when you receive a call they will say they are from [place],  to confirm your mailing address and give you more information.  When they calll you, it will appear on the caller ID as "[employer]" or in some cases only this number will appear, [phone].   [employer] [employer] will ship your collection kit directly to you. You will collect a single stool sample in the privacy of your own home, no special preparation required. You will return the kit via [place] pre-paid shipping or pick-up, in the same box it arrived in. Then I will contact you to discuss your results after I receive them from the laboratory.   If you have any questions or concerns, [facility] Customer Support Specialist are available 24 hours a day, 7 days  a week at [phone] or go to [email or web address].

## 2018-02-23 LAB — LIPID PANEL
Cholesterol: 173 mg/dL (ref <200)
HDL: 71 mg/dL (ref >=50)
LDL Cholesterol (Calc): 81 mg/dL (calc)
Non-HDL Cholesterol (Calc): 102 mg/dL (calc) (ref <130)
Total CHOL/HDL Ratio: 2.4 (calc) (ref <5.0)
Triglycerides: 110 mg/dL (ref <150)

## 2018-02-23 LAB — CBC WITH DIFFERENTIAL/PLATELET
Absolute Monocytes: 449 cells/uL (ref 200–950)
Basophils Absolute: 20 cells/uL (ref 0–200)
Basophils Relative: 0.3 %
EOS ABS: 88 {cells}/uL (ref 15–500)
Eosinophils Relative: 1.3 %
HCT: 41.3 % (ref 35.0–45.0)
Hemoglobin: 13.8 g/dL (ref 11.7–15.5)
Lymphs Abs: 2149 cells/uL (ref 850–3900)
MCH: 30.1 pg (ref 27.0–33.0)
MCHC: 33.4 g/dL (ref 32.0–36.0)
MCV: 90.2 fL (ref 80.0–100.0)
MONOS PCT: 6.6 %
MPV: 10.7 fL (ref 7.5–12.5)
Neutro Abs: 4094 cells/uL (ref 1500–7800)
Neutrophils Relative %: 60.2 %
Platelets: 165 10*3/uL (ref 140–400)
RBC: 4.58 10*6/uL (ref 3.80–5.10)
RDW: 13.1 % (ref 11.0–15.0)
Total Lymphocyte: 31.6 %
WBC: 6.8 10*3/uL (ref 3.8–10.8)

## 2018-02-23 LAB — HEMOGLOBIN A1C
Hgb A1c MFr Bld: 5.7 % of total Hgb — ABNORMAL HIGH (ref <5.7)
Mean Plasma Glucose: 117 (calc)
eAG (mmol/L): 6.5 (calc)

## 2018-02-23 LAB — COMPLETE METABOLIC PANEL WITH GFR
AG Ratio: 2 (calc) (ref 1.0–2.5)
ALT: 11 U/L (ref 6–29)
AST: 25 U/L (ref 10–35)
Albumin: 4.8 g/dL (ref 3.6–5.1)
Alkaline phosphatase (APISO): 46 U/L (ref 37–153)
BUN/Creatinine Ratio: 15 (calc) (ref 6–22)
BUN: 19 mg/dL (ref 7–25)
CO2: 31 mmol/L (ref 20–32)
Calcium: 9.9 mg/dL (ref 8.6–10.4)
Chloride: 102 mmol/L (ref 98–110)
Creat: 1.31 mg/dL — ABNORMAL HIGH (ref 0.60–0.93)
GFR, Est African American: 46 mL/min/{1.73_m2} — ABNORMAL LOW (ref >=60)
GFR, Est Non African American: 40 mL/min/{1.73_m2} — ABNORMAL LOW (ref >=60)
Globulin: 2.4 g/dL (calc) (ref 1.9–3.7)
Glucose, Bld: 101 mg/dL — ABNORMAL HIGH (ref 65–99)
Potassium: 4.1 mmol/L (ref 3.5–5.3)
Sodium: 142 mmol/L (ref 135–146)
Total Bilirubin: 0.6 mg/dL (ref 0.2–1.2)
Total Protein: 7.2 g/dL (ref 6.1–8.1)

## 2018-02-23 LAB — IRON, TOTAL/TOTAL IRON BINDING CAP
%SAT: 27 % (calc) (ref 16–45)
IRON: 92 ug/dL (ref 45–160)
TIBC: 347 mcg/dL (calc) (ref 250–450)

## 2018-02-23 LAB — TSH: TSH: 0.89 mIU/L (ref 0.40–4.50)

## 2018-02-23 LAB — FERRITIN: Ferritin: 68 ng/mL (ref 16–288)

## 2018-03-03 DIAGNOSIS — Z1211 Encounter for screening for malignant neoplasm of colon: Secondary | ICD-10-CM | POA: Diagnosis not present

## 2018-03-08 ENCOUNTER — Ambulatory Visit: Payer: Medicare HMO | Admitting: Neurology

## 2018-03-08 ENCOUNTER — Encounter: Payer: Self-pay | Admitting: Neurology

## 2018-03-08 VITALS — BP 174/84 | HR 77 | Ht 61.0 in | Wt 110.0 lb

## 2018-03-08 DIAGNOSIS — F0281 Dementia in other diseases classified elsewhere with behavioral disturbance: Secondary | ICD-10-CM

## 2018-03-08 DIAGNOSIS — F02818 Dementia in other diseases classified elsewhere, unspecified severity, with other behavioral disturbance: Secondary | ICD-10-CM

## 2018-03-08 DIAGNOSIS — G301 Alzheimer's disease with late onset: Secondary | ICD-10-CM | POA: Diagnosis not present

## 2018-03-08 LAB — COLOGUARD: Cologuard: NEGATIVE

## 2018-03-08 NOTE — Progress Notes (Signed)
Subjective:    Patient ID: Stacy Wallace is a 73 y.o. female.  HPI     Interim history:   Stacy Wallace is a 73 year old right-handed woman with an underlying medical history of hypertension, hypothyroidism, vitamin D deficiency, hyperlipidemia, reflux disease, vitamin B12 deficiency, and insomnia, who presents for follow-up consultation of her dementia. The patient is accompanied by her daughter today. I last saw her on 11/2017, at which time we talked about her test results with neuropsychological evaluation in September 2019. She was able to come off of Xanax. Her MMSE was 20 out of 30 at the time. She was advised to not drive any longer. She had also received a letter from the [facility]. I suggested we start her on Aricept generic.  Today, 03/08/2018: She reports doing well, tolerates that donepezil, takes 1 in the evening. No obvious side effects. She does not eat very well. She reports lack of taste. She has lost some weight. She often skips lunch. Per daughter, she does not eat very much for breakfast either. She has not driven a car since her last appointment. She was recently taken off of Lasix. She was prediabetic and had some dietary changes. Her A1c has come down to 5.7 recently.  The patient's allergies, current medications, family history, past medical history, past social history, past surgical history and problem list were reviewed and updated as appropriate.    Previously:     I first met her on 04/28/2017 at the request of her primary care physician, at which time the patient reported forgetfulness for years. Her daughter reported an approximately eight-year history of memory loss especially after her retirement at age 29. Her MMSE was 16 out of 30 at the time. I suggested further workup in the form of brain MRI and neuropsychological evaluation.   She had a brain MRI without contrast on 05/07/2017 and I reviewed the results: IMPRESSION: This MRI of the brain without contrast shows the  following: 1.   Mild generalized cortical atrophy.  2.   Some scattered T2/FLAIR hyperintense foci in the hemispheres and left cerebellum.   This is nonspecific and could be due to chronic microvascular ischemic change, demyelination or sequela of migraine or inflammation. 3.   There are no acute findings.   We called her daughter with her test results.   She had interim evaluation with neuropsychology, saw Dr. 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 Results of cognitive testing were abnormal and demonstrated significant decline in several areas of cognitive function relative to estimated baseline. Additionally, there is evidence that her cognitive deficits are interfering with her ability to manage complex ADLs (driving, finances, appointments). As such, diagnostic criteria for a dementia syndrome are met.  The patient's cognitive profile is notable for prominent impairment in temporal orientation, memory encoding and consolidation, semantic fluency, auditory comprehension and some aspects of executive functioning. Her clinical features and cognitive profile are most in line with Alzheimer's disease. It is possible that long term Xanax use has exacerbated underlying cognitive dysfunction. I would characterize stage of dementia as mild to moderate at the present time. She denies depression or anxiety symptoms but there does seem to be at least some mild adjustment related depression likely secondary to lack of social interaction. She also appears to be experiencing some mild paranoid ideation (suspicious of her husband helping her  manage finances), which appears to be a neuropsychiatric feature of her dementia.       Recommendations/Plan: Based on the findings of the present evaluation, the following recommendations are  offered:   1. From a neuropsychological perspective, the patient appears to be an appropriate candidate for cholinesterase inhibitor therapy. She will follow up with Dr. Rexene Alberts about this. 2. I discussed with the patient and her family the risks of long-term alprazolam use and the risk of confusion and falls in older adults due to anticholinergic properties. I would recommend slowly tapering off the medication if possible. The patient has been taking this medication for decades and is likely physiologically dependent. Antidepressant therapy (eg SSRI) does not have the same risk of cognitive impairment and may be useful in assisting the patient coming off alprazolam. Of course, all decisions with respect to medications are deferred to her physicians.  3. The patient's performance on a cognitive test highly correlated with driving ability was severely impaired. For the safety of the patient and other motorists, it is my recommendation that she discontinue driving at this time. I had a long discussion with the patient/family about this; the patient is adamant that she can and will continue to drive.  4. She should have assistance with all complex ADLs (finances, appointments, transportation, medications). 5. She is vulnerable to undue influence and financial exploitation, and all safeguards should be taken to reduce the risk of these occurring.  6. She would likely greatly benefit from increased social interaction, mental stimulation and physical exercise. These types of activity would help with brain health but also mood and quality of life. She could attend a day program or, if feasible, hire a private duty companion, I provided local resources for additional information. 7. The patient's family will benefit from caregiver support and education. I provided local resources and contact information.>>   Her daughter called earlier this month requesting that we reinforce that the patient no longer drive as she  was observed by the daughter to not be completely safe to drive. I suggested we discussed this issue during the appointment and follow recommendations particularly made during the neuropsychology follow-up appointment.     04/28/2017: (She) reports memory loss in terms of forgetfulness. She is not sure how long. She reports that it is probably normal to be forgetful at her age. Information was also provided by her daughter Stacy Wallace. She reports that approximately 8 year history of memory loss and her mom, particularly noticeable after she retired at age 66. She works for [employer]. In the past couple of years of memory loss has been much more pronounced. She has been repeating herself often, is more forgetful, has had some trouble driving including getting lost. She lives in a two-story home. She does not typically exercise but reports that she goes up and down the stairs a lot at her house. Her husband is 63 years old and still works. I reviewed your office note from 03/16/2017. She has had recent blood work through your office on 03/23/2017 which I reviewed. B12 level was in the normal range, the vitamin D level was normal, A1c was in the normal range, cholesterol level good, creatinine level 1.4. She is married and lives with her husband. She quit smoking in 1984, drinks alcohol occasionally, drinks caffeine daily. She does not always drink enough water according to the daughter. She has one son and one daughter, both live locally. She has no obvious family history of dementia. She had 4 brothers,  2 passed away, the other 2 she does not have close contact with. She has 2 sisters, none with memory loss. She has lost weight slowly over the past few years, more stable in the past couple of years. She snores but daughter has never noted any pauses in her breathing.   Her Past Medical History Is Significant For: Past Medical History:  Diagnosis Date  . GERD (gastroesophageal reflux disease)   .  Hyperlipidemia   . Hypertension   . Other abnormal glucose   . Unspecified hypothyroidism   . Unspecified vitamin D deficiency     Her Past Surgical History Is Significant For: Past Surgical History:  Procedure Laterality Date  . ABDOMINAL HYSTERECTOMY    . CARPAL TUNNEL RELEASE Bilateral 2001  . EYE SURGERY    . NASAL SEPTUM SURGERY    . TUBAL LIGATION      Her Family History Is Significant For: Family History  Problem Relation Age of Onset  . Hypertension Mother   . Hyperlipidemia Mother   . Heart disease Father   . Alcohol abuse Father   . Hyperlipidemia Father   . Hypertension Father   . Heart disease Sister   . Stroke Sister   . Depression Sister   . Heart disease Brother     Her Social History Is Significant For: Social History   Socioeconomic History  . Marital status: Married    Spouse name: Not on file  . Number of children: Not on file  . Years of education: Not on file  . Highest education level: Not on file  Occupational History  . Not on file  Social Needs  . Financial resource strain: Not on file  . Food insecurity:    Worry: Not on file    Inability: Not on file  . Transportation needs:    Medical: Not on file    Non-medical: Not on file  Tobacco Use  . Smoking status: Former Smoker    Last attempt to quit: 03/20/1988    Years since quitting: 29.9  . Smokeless tobacco: Never Used  Substance and Sexual Activity  . Alcohol use: Yes    Comment: rare glass of wine  . Drug use: Not on file  . Sexual activity: Not on file  Lifestyle  . Physical activity:    Days per week: Not on file    Minutes per session: Not on file  . Stress: Not on file  Relationships  . Social connections:    Talks on phone: Not on file    Gets together: Not on file    Attends religious service: Not on file    Active member of club or organization: Not on file    Attends meetings of clubs or organizations: Not on file    Relationship status: Not on file  Other  Topics Concern  . Not on file  Social History Narrative  . Not on file    Her Allergies Are:  Allergies  Allergen Reactions  . Lopid [Gemfibrozil]     headache  . Morphine And Related Other (See Comments)    Reaction: unknown   . Nickel Other (See Comments)    Reaction: unknown   . Latex Rash  :   Her Current Medications Are:  Outpatient Encounter Medications as of 03/08/2018  Medication Sig  . atenolol (TENORMIN) 100 MG tablet TAKE 1 TABLET EVERY DAY  . benazepril (LOTENSIN) 40 MG tablet Take tablet at night for BP  . BIOTIN PO Take  1 capsule by mouth daily.  Marland Kitchen donepezil (ARICEPT) 10 MG tablet Take 1/2 pill each evening for 2 weeks, then 1 pill daily each evening thereafter. (Patient taking differently: Take 10 mg by mouth at bedtime. Take 1/2 pill each evening for 2 weeks, then 1 pill daily each evening thereafter.)  . levothyroxine (SYNTHROID, LEVOTHROID) 50 MCG tablet TAKE 1 TABLET EVERY DAY BEFORE BREAKFAST  . Magnesium 500 MG TABS Take 500 mg by mouth daily.  . meloxicam (MOBIC) 15 MG tablet TAKE 1 TABLET EVERY DAY AS NEEDED FOR PAIN  . omeprazole (PRILOSEC) 20 MG capsule TAKE 1 CAPSULE EVERY DAY  . rosuvastatin (CRESTOR) 40 MG tablet TAKE 1 TABLET EVERY DAY  OR AS DIRECTED FOR CHOLESTEROL   No facility-administered encounter medications on file as of 03/08/2018.   :  Review of Systems:  Out of a complete 14 point review of systems, all are reviewed and negative with the exception of these symptoms as listed below:  Review of Systems  Neurological:       Pt presents today to discuss her memory. Pt reports that she is tolerating the aricept well.    Objective:  Neurological Exam  Physical Exam Physical Examination:   Vitals:   03/08/18 1353  BP: (!) 174/84  Pulse: 77   General Examination: The patient is a very pleasant 73 y.o. female in no acute distress. She appears well-developed and well-nourished and well groomed.   HEENT:Normocephalic, atraumatic, pupils  are equal, round and reactive to light and accommodation. Extraocular tracking is good without limitation to gaze excursion or nystagmus noted. Normal smooth pursuit is noted. Hearing is grossly intact. Face is symmetric with normal facial animation and normal facial sensation. Speech is clear with no dysarthria noted. There is no hypophonia. There is no lip, neck/head, jaw or voice tremor. Neck shows FROM. Oropharynx exam reveals: mild to moderate mouth dryness, adequatedental hygiene.Tongue protrudes centrally and palate elevates symmetrically.  Chest:Clear to auscultation without wheezing, rhonchi or crackles noted.  Heart:S1+S2+0, regular and normal without murmurs, rubs or gallops noted.   Abdomen:Soft, non-tender and non-distended with normal bowel sounds appreciated on auscultation.  Extremities:There isnopitting edema in the distal lower extremities bilaterally.   Skin: Warm and dry without trophic changes noted.  Musculoskeletal: exam reveals no obvious joint deformities, tenderness or joint swelling or erythema.   Neurologically:  Mental status: The patient is awake, alert and partially oriented. Her immediate and remote memory, attention, language skills and fund of knowledge areimpaired.There is no evidence of aphasia, agnosia, apraxia or anomia. Speech is clear with normal prosody and enunciation. Thought process is linear. Mood isnormaland affect is normal.  On 04/28/2017: MMSE: 16/30, CDT: 2/4, AFT: 5/min.  On 11/24/2017: MMSE: 20/30, CDT: 3/4, AFT: 6/min.  Cranial nerves II - XII are as described above under HEENT exam.  Motor exam: thin bulk, normal strength and tone is noted. There is no tremor. Fine motor skills and coordination: intactgrossly. Cerebellar testing: No dysmetria or intention tremor.  Sensory exam: intact to light touch.  Gait, station and balance:Shestands easily. No veering to one side is noted. No leaning to one side is noted.  Posture is age-appropriate and stance is narrow based. Gait showsnormalstride length and normalpace. No problems turning are noted.   Assessmentand Plan:   In summary,Stacy F Smithis a very pleasant 66 year oldfemalewith an underlying medical history of hypertension, hypothyroidism, vitamin D deficiency, hyperlipidemia, reflux disease, vitamin B12 deficiency, and insomnia, whopresents for FU consultation of her memory loss. She  has had a several year history of memory loss, more noticeable in the past 2-3 years.she has been on donepezil since November 2019. She had workup in the form of brain MRI in May 2019 which showed mild generalized atrophy and microvascular changes, and neuropsychological evaluation with cognitive testing in September 2019 with follow-up in October 2019. Her history and findings and neuropsychological test results are supportive of a diagnosis of Alzheimer's disease. Today, we talked about the importance of healthy lifestyle and good nutrition. She is encouraged to monitor her weight and supplementation nutrition if needed with a protein milkshake. She is advised to stay well-hydrated we talked about the importance of physical activity. She has been able to tolerate the generic Aricept. She is encouraged to continue with it.  We talked about expectations, side effects and limitations of this medication again today. We may consider a second memory drug. I would like for her to follow-up in 3 months, we will repeat MMSE testing at the time and take it from there. I answered all their questions today and the patient and her daughter were in agreement. I spent 20 minutes in total face-to-face time with the patient, more than 50% of which was spent in counseling and coordination of care, reviewing test results, reviewing medication and discussing or reviewing the diagnosis of dementia, its prognosis and treatment options. Pertinent laboratory and imaging test results  that were available during this visit with the patient were reviewed by me and considered in my medical decision making (see chart for details).

## 2018-03-08 NOTE — Patient Instructions (Addendum)
Please keep monitoring your weight and nutrition.  Please try to supplement with a protein milkshake.  Please hydrate well with water.  We will continue with the donepezil 10 mg daily.

## 2018-03-14 ENCOUNTER — Other Ambulatory Visit: Payer: Self-pay | Admitting: Internal Medicine

## 2018-03-21 ENCOUNTER — Other Ambulatory Visit: Payer: Self-pay | Admitting: Internal Medicine

## 2018-03-29 ENCOUNTER — Other Ambulatory Visit: Payer: Self-pay | Admitting: Physician Assistant

## 2018-03-29 DIAGNOSIS — G4700 Insomnia, unspecified: Secondary | ICD-10-CM

## 2018-03-29 MED ORDER — ALPRAZOLAM 1 MG PO TABS: 1.0000 mg | ORAL_TABLET | Freq: Every day | ORAL | 1 refills | Status: DC

## 2018-04-05 ENCOUNTER — Other Ambulatory Visit: Payer: Self-pay | Admitting: Physician Assistant

## 2018-04-05 DIAGNOSIS — I10 Essential (primary) hypertension: Secondary | ICD-10-CM

## 2018-04-05 MED ORDER — BENAZEPRIL HCL 20 MG PO TABS: ORAL_TABLET | ORAL | 1 refills | Status: DC

## 2018-04-06 ENCOUNTER — Other Ambulatory Visit: Payer: Self-pay | Admitting: *Deleted

## 2018-04-06 DIAGNOSIS — I10 Essential (primary) hypertension: Secondary | ICD-10-CM

## 2018-04-06 MED ORDER — BENAZEPRIL HCL 20 MG PO TABS: ORAL_TABLET | ORAL | 1 refills | Status: DC

## 2018-05-23 ENCOUNTER — Other Ambulatory Visit: Payer: Self-pay | Admitting: Internal Medicine

## 2018-05-24 ENCOUNTER — Other Ambulatory Visit: Payer: Self-pay | Admitting: *Deleted

## 2018-05-24 ENCOUNTER — Other Ambulatory Visit: Payer: Self-pay | Admitting: Internal Medicine

## 2018-05-24 MED ORDER — LEVOTHYROXINE SODIUM 50 MCG PO TABS: ORAL_TABLET | ORAL | 1 refills | Status: DC

## 2018-05-26 ENCOUNTER — Ambulatory Visit: Payer: Self-pay | Admitting: Internal Medicine

## 2018-05-28 ENCOUNTER — Other Ambulatory Visit: Payer: Self-pay | Admitting: Internal Medicine

## 2018-05-30 ENCOUNTER — Encounter: Payer: Self-pay | Admitting: Internal Medicine

## 2018-05-30 DIAGNOSIS — F0150 Vascular dementia without behavioral disturbance: Secondary | ICD-10-CM | POA: Insufficient documentation

## 2018-05-30 DIAGNOSIS — R7303 Prediabetes: Secondary | ICD-10-CM | POA: Insufficient documentation

## 2018-05-30 MED ORDER — DONEPEZIL HCL 10 MG PO TABS: ORAL_TABLET | ORAL | 3 refills | Status: DC

## 2018-05-30 NOTE — Progress Notes (Signed)
THIS ENCOUNTER IS A VIRTUAL VISIT DUE TO COVID-19 - PATIENT WAS NOT SEEN IN THE OFFICE.  PATIENT HAS CONSENTED TO VIRTUAL VISIT / TELEMEDICINE VISIT  This provider placed a call to Stacy Wallace using telephone, her appointment was changed to a virtual office visit to reduce the risk of exposure to the COVID-19 virus and to help Stacy Wallace remain healthy and safe. The virtual visit will also provide continuity of care. She verbalizes understanding.   Virtual Visit via telephone Note  I connected with  Stacy Wallace  on 05/31/18  by telephone.  I verified that I am speaking with the correct person using two identifiers.        I discussed the limitations of evaluation and management by telemedicine and the availability of in person appointments. The patient expressed understanding and agreed to proceed.  History of Present Illness:      This very nice 73 y.o. MWF presents for 3 month follow up with HTN, Dementia, HLD, Pre-Diabetes and Vitamin D Deficiency. Patient also has GERD controlled on her meds.    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 Patient does have hx/o Low Vit B12 of 287 in Apr 2018. Patient has poor insight to her memory impairment and is not accepting her diagnosis of memory loss / Dementia.       Patient is treated for HTN (1999) & BP has been controlled at home. Patient also has CKD3. Today's  . Patient has had no complaints of any cardiac type chest pain, palpitations, dyspnea / orthopnea / PND, dizziness, claudication, or dependent edema.      Hyperlipidemia is controlled with diet & meds. Patient denies myalgias or other med SE's. Last Lipids were at goal: Lab Results  Component Value Date   CHOL 173 02/22/2018   HDL 71 02/22/2018   LDLCALC 81 02/22/2018   TRIG 110 02/22/2018   CHOLHDL 2.4 02/22/2018       Also, the patient has history of PreDiabetes  (A1c5.8% / 2011 &5.9% / 2014) and has had no symptoms of reactive  hypoglycemia, diabetic polys, paresthesias or visual blurring.  Last A1c was near goal: Lab Results  Component Value Date   HGBA1C 5.7 (H) 02/22/2018      Patient has been on Thyroid replacement since 2014.     Further, the patient also has history of Vitamin D Deficiency and supplements vitamin D without any suspected side-effects. Last vitamin D was at goal: Lab Results  Component Value Date   VD25OH 61 11/11/2017   Current Outpatient Medications on File Prior to Visit  Medication Sig  . atenolol (TENORMIN) 100 MG tablet TAKE 1 TABLET EVERY DAY  . benazepril (LOTENSIN) 20 MG tablet Take 1 tablet (20mg ) at night for BP  . levothyroxine (SYNTHROID) 50 MCG tablet TAKE 1 TABLET EVERY DAY BEFORE BREAKFAST  . Magnesium 500 MG TABS Take 500 mg by mouth daily.  . meloxicam (MOBIC) 15 MG tablet Take 1/2 to 1 tablet Daily with food for Pain & Inflammation & limit to 4 to 5 days /week to Avoid Kidney Damage  . omeprazole (PRILOSEC) 20 MG capsule Take 1 capsule Daily for Acid Indigestion & Reflux  . rosuvastatin (CRESTOR) 40 MG tablet Take 1 tablet Daily for Cholesterol  . BIOTIN PO Take 1 capsule by mouth daily.   No current facility-administered medications on file prior to visit.    Allergies  Allergen Reactions  . Lopid [Gemfibrozil]  headache  . Morphine And Related Other (See Comments)    Reaction: unknown   . Nickel Other (See Comments)    Reaction: unknown   . Latex Rash   PMHx:   Past Medical History:  Diagnosis Date  . GERD (gastroesophageal reflux disease)   . Hyperlipidemia   . Hypertension   . Other abnormal glucose   . Unspecified hypothyroidism   . Unspecified vitamin D deficiency    Immunization History  Administered Date(s) Administered  . Influenza, High Dose Seasonal PF 09/19/2013, 10/16/2014, 10/08/2015, 11/30/2016  . Influenza-Unspecified 09/15/2012, 11/05/2016, 11/05/2017  . Pneumococcal Conjugate-13 08/08/2015  . Pneumococcal-Unspecified 03/18/2010   . Td 11/09/2006   Past Surgical History:  Procedure Laterality Date  . ABDOMINAL HYSTERECTOMY    . CARPAL TUNNEL RELEASE Bilateral 2001  . EYE SURGERY    . NASAL SEPTUM SURGERY    . TUBAL LIGATION     FHx:    Reviewed / unchanged SHx:    Reviewed / unchanged   Systems Review:  Constitutional: Denies fever, chills, wt changes, headaches, insomnia, fatigue, night sweats, change in appetite. Eyes: Denies redness, blurred vision, diplopia, discharge, itchy, watery eyes.  ENT: Denies discharge, congestion, post nasal drip, epistaxis, sore throat, earache, hearing loss, dental pain, tinnitus, vertigo, sinus pain, snoring.  CV: Denies chest pain, palpitations, irregular heartbeat, syncope, dyspnea, diaphoresis, orthopnea, PND, claudication or edema. Respiratory: denies cough, dyspnea, DOE, pleurisy, hoarseness, laryngitis, wheezing.  Gastrointestinal: Denies dysphagia, odynophagia, heartburn, reflux, water brash, abdominal pain or cramps, nausea, vomiting, bloating, diarrhea, constipation, hematemesis, melena, hematochezia  or hemorrhoids. Genitourinary: Denies dysuria, frequency, urgency, nocturia, hesitancy, discharge, hematuria or flank pain. Musculoskeletal: Denies arthralgias, myalgias, stiffness, jt. swelling, pain, limping or strain/sprain.  Skin: Denies pruritus, rash, hives, warts, acne, eczema or change in skin lesion(s). Neuro: No weakness, tremor, incoordination, spasms, paresthesia or pain. Psychiatric: Denies confusion, memory loss or sensory loss. Endo: Denies change in weight, skin or hair change.  Heme/Lymph: No excessive bleeding, bruising or enlarged lymph nodes.  Physical Exam  BP initially 249/119 by wrist cuff by daughter Stacy Wallace. Asked daughter to take her to Fire station to recheck & repeat BP was 210/92. As patient is asymptomatic , meds changed as below and daughter to monitor BP's 2 x/day and was given ER precautions for any suspect neuro sx's.   Appears  well  nourished, well groomed  and in no distress.  Eyes: PERRLA, EOMs, conjunctiva no swelling or erythema. Sinuses: No frontal/maxillary tenderness ENT/Mouth: EAC's clear, TM's nl w/o erythema, bulging. Nares clear w/o erythema, swelling, exudates. Oropharynx clear without erythema or exudates. Oral hygiene is good. Tongue normal, non obstructing. Hearing intact.  Neck: Supple. Thyroid not palpable. Car 2+/2+ without bruits, nodes or JVD. Chest: Respirations nl with BS clear & equal w/o rales, rhonchi, wheezing or stridor.  Cor: Heart sounds normal w/ regular rate and rhythm without sig. murmurs, gallops, clicks or rubs. Peripheral pulses normal and equal  without edema.  Abdomen: Soft & bowel sounds normal. Non-tender w/o guarding, rebound, hernias, masses or organomegaly.  Lymphatics: Unremarkable.  Musculoskeletal: Full ROM all peripheral extremities, joint stability, 5/5 strength and normal gait.  Skin: Warm, dry without exposed rashes, lesions or ecchymosis apparent.  Neuro: Cranial nerves intact, reflexes equal bilaterally. Sensory-motor testing grossly intact. Tendon reflexes grossly intact.  Pysch: Alert & oriented x 3.  Insight and judgement nl & appropriate. No ideations.  Assessment and Plan:  1. Essential hypertension  - Continue medication,  - Added new meds - HCTZ &  Benicar as below  - D/C'd Lisinopril   - Continue DASH diet.  Reminder to go to the ER if any CP,  SOB, nausea, dizziness, severe HA, changes vision/speech.  - hydrochlorothiazide (HYDRODIURIL) 25 MG tablet; Take 1 tablet daily for BP and fluid  Dispense: 30 tablet; Refill: 0 - olmesartan (BENICAR) 40 MG tablet; Take 1 tablet Daily for BP  Dispense: 30 tablet; Refill: 0  2. Hyperlipidemia, mixed  - Continue diet/meds, exercise,& lifestyle modifications.  - Continue monitor periodic cholesterol/liver & renal functions   3. Vitamin D deficiency  - Continue diet, exercise  - Lifestyle modifications.  - Monitor  appropriate labs.  4. Abnormal glucose  - Continue supplementation.  5. Vascular dementia without behavioral disturbance ([place])  - Patient is reminded AGAIN NOT to take Alprazolam / Xanax.  - donepezil (ARICEPT) 10 MG tablet; Take 1 tablet Daily for Memory  Dispense: 90 tablet; Refill: 3  6. Prediabetes  7. Medication management        Discussed  regular activities, BP monitoring, weight control to achieve/maintain ideal body weight and discussed med and SE's. Deferred labs due to Covid isolation.  I discussed the assessment and treatment plan with the patient & her daughter Stacy Wallace who manages & dispenses her meds. The patient was provided an opportunity to ask questions and all were answered. The patient agreed with the plan and demonstrated an understanding of the instructions. I provided 22 minutes of non-face-to-face time during this encounter and over 20minutes of exam, counseling, chart review and  complex critical decision making was performed   8:30 pm  - Spoke with Stacy Wallace - patient's daughter - they got the new Rx's OK & have started the new schedule and daughter Stacy Wallace will be monitoring BP's closely.    Kirtland Bouchard, MD

## 2018-05-30 NOTE — Patient Instructions (Signed)

## 2018-05-31 ENCOUNTER — Encounter: Payer: Self-pay | Admitting: Internal Medicine

## 2018-05-31 ENCOUNTER — Other Ambulatory Visit: Payer: Self-pay

## 2018-05-31 ENCOUNTER — Ambulatory Visit: Payer: Medicare HMO | Admitting: Internal Medicine

## 2018-05-31 VITALS — BP 210/92 | HR 70 | Temp 97.0°F | Resp 12 | Wt 113.0 lb

## 2018-05-31 DIAGNOSIS — I10 Essential (primary) hypertension: Secondary | ICD-10-CM

## 2018-05-31 DIAGNOSIS — Z79899 Other long term (current) drug therapy: Secondary | ICD-10-CM | POA: Diagnosis not present

## 2018-05-31 DIAGNOSIS — R7309 Other abnormal glucose: Secondary | ICD-10-CM

## 2018-05-31 DIAGNOSIS — F0150 Vascular dementia without behavioral disturbance: Secondary | ICD-10-CM

## 2018-05-31 DIAGNOSIS — E782 Mixed hyperlipidemia: Secondary | ICD-10-CM | POA: Diagnosis not present

## 2018-05-31 DIAGNOSIS — R7303 Prediabetes: Secondary | ICD-10-CM | POA: Diagnosis not present

## 2018-05-31 DIAGNOSIS — E559 Vitamin D deficiency, unspecified: Secondary | ICD-10-CM | POA: Diagnosis not present

## 2018-05-31 MED ORDER — HYDROCHLOROTHIAZIDE 25 MG PO TABS: ORAL_TABLET | ORAL | 0 refills | Status: DC

## 2018-05-31 MED ORDER — OLMESARTAN MEDOXOMIL 40 MG PO TABS: ORAL_TABLET | ORAL | 1 refills | Status: DC

## 2018-05-31 MED ORDER — OLMESARTAN MEDOXOMIL 40 MG PO TABS: ORAL_TABLET | ORAL | 0 refills | Status: DC

## 2018-05-31 MED ORDER — HYDROCHLOROTHIAZIDE 25 MG PO TABS: ORAL_TABLET | ORAL | 1 refills | Status: DC

## 2018-06-02 ENCOUNTER — Other Ambulatory Visit: Payer: Self-pay | Admitting: *Deleted

## 2018-06-02 MED ORDER — LEVOTHYROXINE SODIUM 50 MCG PO TABS: ORAL_TABLET | ORAL | 1 refills | Status: DC

## 2018-06-13 ENCOUNTER — Telehealth: Payer: Self-pay | Admitting: Neurology

## 2018-06-13 NOTE — Telephone Encounter (Signed)
Due to current COVID 19 pandemic, our office is severely reducing in office visits until further notice, in order to minimize the risk to our patients and healthcare providers.   Called patient to offer virtual visit for 6/10 appt. I spoke with patient's daughter who handles appt. Patient's dghter accepted and verbalized understanding of the doxy process. I have sent an e-mail with link and instructions, as well as my name and office number/hours. She understands that she will receive a call from RN prior to appt.  Pt understands that although there may be some limitations with this type of visit, we will take all precautions to reduce any security or privacy concerns.  Pt understands that this will be treated like an in office visit and we will file with pt's insurance, and there may be a patient responsible charge related to this service.

## 2018-06-14 NOTE — Telephone Encounter (Signed)
I called pt, spoke with pt's daughter, Stacy Wallace, per [facility]. Pt's meds, allergies, and PMH were updated.  Pt's daughter will call me back this afternoon with pt to complete MMSE.

## 2018-06-15 ENCOUNTER — Encounter: Payer: Self-pay | Admitting: Neurology

## 2018-06-15 ENCOUNTER — Other Ambulatory Visit: Payer: Self-pay

## 2018-06-15 ENCOUNTER — Telehealth: Payer: Self-pay

## 2018-06-15 ENCOUNTER — Ambulatory Visit (INDEPENDENT_AMBULATORY_CARE_PROVIDER_SITE_OTHER): Payer: Medicare HMO | Admitting: Neurology

## 2018-06-15 DIAGNOSIS — F02818 Dementia in other diseases classified elsewhere, unspecified severity, with other behavioral disturbance: Secondary | ICD-10-CM

## 2018-06-15 DIAGNOSIS — G301 Alzheimer's disease with late onset: Secondary | ICD-10-CM | POA: Diagnosis not present

## 2018-06-15 DIAGNOSIS — F0281 Dementia in other diseases classified elsewhere with behavioral disturbance: Secondary | ICD-10-CM

## 2018-06-15 NOTE — Patient Instructions (Signed)
Given verbally, during today's virtual video-based encounter, with verbal feedback received.   

## 2018-06-15 NOTE — Progress Notes (Signed)
Interim history:  Stacy Wallace is a 73 year old right-handed woman with an underlying medical history of hypertension, hypothyroidism, vitamin D deficiency, hyperlipidemia, reflux disease, vitamin B12 deficiency, and insomnia, who presents for a virtual, video based appointment via [email or web address] for follow-up consultation of her dementia. The patient is accompanied by her daughter today and joins from home, I am located in my office. I last saw her on 03/08/2018, at which time she was able to tolerate generic Aricept 1 pill in the evening.  She had lost some weight.  She was not always keeping a regular eating schedule.  We talked about the importance of healthy lifestyle, good hydration, enough rest and healthy nutrition and we also considered adding a second memory medication down the road.  Her neuropsychological evaluation in September 2019 was supportive of mild to moderate dementia, probably Alzheimer's disease.  Today, 06/15/2018: Please also see below for virtual visit documentation.    The patient's allergies, current medications, family history, past medical history, past social history, past surgical history and problem list were reviewed and updated as appropriate.    Previously:    I saw her on 11/2017, at which time we talked about her test results with neuropsychological evaluation in September 2019. She was able to come off of Xanax. Her MMSE was 20 out of 30 at the time. She was advised to not drive any longer. She had also received a letter from the [facility]. I suggested we start her on Aricept generic.   I first met her on 04/28/2017 at the request of her primary care physician, at which time the patient reported forgetfulness for years. Her daughter reported an approximately eight-year history of memory loss especially after her retirement at age 86. Her MMSE was 16 out of 30 at the time. I suggested further workup in the form of brain MRI and neuropsychological evaluation.   She had a brain  MRI without contrast on 05/07/2017 and I reviewed the results: IMPRESSION: This MRI of the brain without contrast shows the following: 1.   Mild generalized cortical atrophy.  2.   Some scattered T2/FLAIR hyperintense foci in the hemispheres and left cerebellum.   This is nonspecific and could be due to chronic microvascular ischemic change, demyelination or sequela of migraine or inflammation. 3.   There are no acute findings.   We called her daughter with her test results.   She had interim evaluation with neuropsychology, saw Dr. 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 Results of cognitive testing were abnormal and demonstrated significant decline in several areas of cognitive function relative to estimated baseline. Additionally, there is evidence that her cognitive deficits are interfering with her ability to manage complex ADLs (driving, finances, appointments). As such, diagnostic criteria for a dementia syndrome are met.  The patient's cognitive profile is notable for prominent impairment in temporal orientation, memory encoding and consolidation, semantic fluency, auditory comprehension and some aspects of executive functioning. Her clinical features and cognitive profile are most in line with Alzheimer's disease. It is possible that long term Xanax use has exacerbated underlying cognitive dysfunction. I would characterize stage of dementia as mild to moderate at the present time. She denies depression or anxiety symptoms but there does seem to be at least some mild adjustment related depression likely secondary to lack of social interaction. She also appears to be experiencing  some mild paranoid ideation (suspicious of her husband helping her manage finances), which appears to be a neuropsychiatric feature of her  dementia.       Recommendations/Plan: Based on the findings of the present evaluation, the following recommendations are offered:   1. From a neuropsychological perspective, the patient appears to be an appropriate candidate for cholinesterase inhibitor therapy. She will follow up with Dr. Rexene Alberts about this. 2. I discussed with the patient and her family the risks of long-term alprazolam use and the risk of confusion and falls in older adults due to anticholinergic properties. I would recommend slowly tapering off the medication if possible. The patient has been taking this medication for decades and is likely physiologically dependent. Antidepressant therapy (eg SSRI) does not have the same risk of cognitive impairment and may be useful in assisting the patient coming off alprazolam. Of course, all decisions with respect to medications are deferred to her physicians.  3. The patient's performance on a cognitive test highly correlated with driving ability was severely impaired. For the safety of the patient and other motorists, it is my recommendation that she discontinue driving at this time. I had a long discussion with the patient/family about this; the patient is adamant that she can and will continue to drive.  4. She should have assistance with all complex ADLs (finances, appointments, transportation, medications). 5. She is vulnerable to undue influence and financial exploitation, and all safeguards should be taken to reduce the risk of these occurring.  6. She would likely greatly benefit from increased social interaction, mental stimulation and physical exercise. These types of activity would help with brain health but also mood and quality of life. She could attend a day program or, if feasible, hire a private duty companion, I provided local resources for additional information. 7. The patient's family will benefit from caregiver support and education. I provided local resources and contact  information.>>   Her daughter called earlier this month requesting that we reinforce that the patient no longer drive as she was observed by the daughter to not be completely safe to drive. I suggested we discussed this issue during the appointment and follow recommendations particularly made during the neuropsychology follow-up appointment.     04/28/2017: (She) reports memory loss in terms of forgetfulness. She is not sure how long. She reports that it is probably normal to be forgetful at her age. Information was also provided by her daughter Stacy Wallace. She reports that approximately 8 year history of memory loss and her mom, particularly noticeable after she retired at age 23. She works for [employer]. In the past couple of years of memory loss has been much more pronounced. She has been repeating herself often, is more forgetful, has had some trouble driving including getting lost. She lives in a two-story home. She does not typically exercise but reports that she goes up and down the stairs a lot at her house. Her husband is 32 years old and still works. I reviewed your office note from 03/16/2017. She has had recent blood work through your office on 03/23/2017 which I reviewed. B12 level was in the normal range, the vitamin D level was normal, A1c was in the normal range, cholesterol level good, creatinine level 1.4. She is married and lives with her husband. She quit smoking in 1984, drinks alcohol occasionally, drinks caffeine daily. She does not always drink enough water according to the daughter. She has one son and one daughter, both live locally. She has  no obvious family history of dementia. She had 4 brothers, 2 passed away, the other 2 she does not have close contact with. She has 2 sisters, none with memory loss. She has lost weight slowly over the past few years, more stable in the past couple of years. She snores but daughter has never noted any pauses in her breathing.   Her Past  Medical History Is Significant For: Past Medical History:  Diagnosis Date   GERD (gastroesophageal reflux disease)    Hyperlipidemia    Hypertension    Other abnormal glucose    Unspecified hypothyroidism    Unspecified vitamin D deficiency     Her Past Surgical History Is Significant For: Past Surgical History:  Procedure Laterality Date   ABDOMINAL HYSTERECTOMY     CARPAL TUNNEL RELEASE Bilateral 2001   EYE SURGERY     NASAL SEPTUM SURGERY     TUBAL LIGATION      Her Family History Is Significant For: Family History  Problem Relation Age of Onset   Hypertension Mother    Hyperlipidemia Mother    Heart disease Father    Alcohol abuse Father    Hyperlipidemia Father    Hypertension Father    Heart disease Sister    Stroke Sister    Depression Sister    Heart disease Brother     Her Social History Is Significant For: Social History   Socioeconomic History   Marital status: Married    Spouse name: Not on file   Number of children: Not on file   Years of education: Not on file   Highest education level: Not on file  Occupational History   Not on file  Social Needs   Financial resource strain: Not on file   Food insecurity:    Worry: Not on file    Inability: Not on file   Transportation needs:    Medical: Not on file    Non-medical: Not on file  Tobacco Use   Smoking status: Former Smoker    Last attempt to quit: 03/20/1988    Years since quitting: 30.2   Smokeless tobacco: Never Used  Substance and Sexual Activity   Alcohol use: Yes    Comment: rare glass of wine   Drug use: Not on file   Sexual activity: Not on file  Lifestyle   Physical activity:    Days per week: Not on file    Minutes per session: Not on file   Stress: Not on file  Relationships   Social connections:    Talks on phone: Not on file    Gets together: Not on file    Attends religious service: Not on file    Active member of club or  organization: Not on file    Attends meetings of clubs or organizations: Not on file    Relationship status: Not on file  Other Topics Concern   Not on file  Social History Narrative   Not on file    Her Allergies Are:  Allergies  Allergen Reactions   Lopid [Gemfibrozil]     headache   Morphine And Related Other (See Comments)    Reaction: unknown    Nickel Other (See Comments)    Reaction: unknown    Latex Rash  :   Her Current Medications Are:  Outpatient Encounter Medications as of 06/15/2018  Medication Sig   atenolol (TENORMIN) 100 MG tablet TAKE 1 TABLET EVERY DAY   benazepril (LOTENSIN) 20 MG tablet Take  1 tablet ('20mg'$ ) at night for BP   BIOTIN PO Take 1 capsule by mouth daily.   donepezil (ARICEPT) 10 MG tablet Take 1 tablet Daily for Memory   hydrochlorothiazide (HYDRODIURIL) 25 MG tablet Take 1 tablet daily for BP and fluid   levothyroxine (SYNTHROID) 50 MCG tablet TAKE 1 TABLET EVERY DAY BEFORE BREAKFAST   Magnesium 500 MG TABS Take 500 mg by mouth daily.   meloxicam (MOBIC) 15 MG tablet Take 1/2 to 1 tablet Daily with food for Pain & Inflammation & limit to 4 to 5 days /week to Avoid Kidney Damage   olmesartan (BENICAR) 40 MG tablet Take 1 tablet Daily for BP   omeprazole (PRILOSEC) 20 MG capsule Take 1 capsule Daily for Acid Indigestion & Reflux   rosuvastatin (CRESTOR) 40 MG tablet Take 1 tablet Daily for Cholesterol   No facility-administered encounter medications on file as of 06/15/2018.   :  Review of Systems:  Out of a complete 14 point review of systems, all are reviewed and negative with the exception of these symptoms as listed below:  Virtual Visit via Video Note on 06/15/2018:  I connected with Stacy Wallace on 06/15/18 at  1:00 PM EDT by a video enabled telemedicine application and verified that I am speaking with the correct person using two identifiers.   I discussed the limitations of evaluation and management by telemedicine and the  availability of in person appointments. The patient expressed understanding and agreed to proceed.  History of Present Illness: She reports Doing well, daughter reports that memory seems to be a little better even.  Appetite is stable, she hydrates very well with water.  Weight is stable, slight increase in the recent past by a couple of pounds.  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  She was started on all losartan by her primary care physician recently and numbers have come down nicely.  Most recent check today by daughters report was 113/69.  Patient denies having had any chest pain or headache in the context of blood pressure elevation.  Nevertheless, since the blood pressure numbers are better her daughter reports that her memory or cognitive function seems to be a slightlySurgery recently and even went to the fire department for a recheck and numbers were high.  She was started on all losartan by her primary care physician recently and numbers have come down nicely.  Most recent check today by daughters report was 113/69.  Patient denies having had any chest pain or headache in the context of blood pressure elevation.  Nevertheless, since the blood pressure numbers are better her daughter reports that her memory or cognitive function seems to be a Little better as well.  She tolerates the donepezil, takes it in the afternoon 10 mg strength.    Observations/Objective: Her MMSE virtually is 15 out of 22, animal fluency 6/min. On examination, she is pleasant and conversant, in no acute distress.  Comprehension is fairly well-preserved, speech is clear without dysarthria, hypophonia or voice tremor.  Her history however is primarily provided by her daughter.  Hearing is grossly intact, extraocular movements preserved.  Upper body mobility, muscle bulk and coordination grossly intact.   Assessment and Plan: In  summary,Stacy F Smithis a very pleasant 41 year oldfemalewith an underlying medical history of hypertension, hypothyroidism, vitamin D deficiency, hyperlipidemia, reflux disease, vitamin B12 deficiency, and insomnia, whopresents for A virtual, video based appointment via doxy.me  for follow-upconsultation of her memory loss. She has a several year history of memory loss, more noticeable in the past 3+ years. She has been on donepezil since November 2019. She had workup in the form of brain MRI in May 2019 (mild generalized atrophy and microvascular changes), and neuropsychological evaluation with cognitive testing in September 2019 with follow-up in October 2019. Her history and findings and neuropsychological test results are supportive of a diagnosis of Alzheimer's disease. Wetalked about the importance of healthy lifestyle and good nutrition. She is encouraged to monitor her weight and consider supplementation with a protein milkshake (per daughter, she recently declined to try Premier Protein). She is advised to stay well-hydrated and she has done a good job with the hydration with water lately. She has been able to tolerate the generic Aricept. She is encouraged to continue with it. We may consider a second memory drug in the near future. I would like for her to follow-up in 3 to 4 months. She did not need a refill on the Aricept today.  I answered all their questions today and the patient and her daughter were in agreement.  Follow Up Instructions:    I discussed the assessment and treatment plan with the patient. The patient was provided an opportunity to ask questions and all were answered. The patient agreed with the plan and demonstrated an understanding of the instructions.   The patient was advised to call back or seek an in-person evaluation if the symptoms worsen or if the condition fails to improve as anticipated.  I provided 15 minutes of non-face-to-face time during this  encounter.   Star Age, MD

## 2018-06-15 NOTE — Telephone Encounter (Signed)
I called pt to schedule her 4 mo follow up. No answer, left a message asking her to call us back. If pt calls back, please assist her getting scheduled.

## 2018-06-15 NOTE — Telephone Encounter (Signed)
I called pt to complete her MMSE. No answer, left a message asking her to call me back.

## 2018-06-15 NOTE — Telephone Encounter (Signed)
Pt called back.  MMSE completed via phone: 15/22, AFT: 6

## 2018-06-21 ENCOUNTER — Other Ambulatory Visit: Payer: Self-pay | Admitting: *Deleted

## 2018-06-21 DIAGNOSIS — I10 Essential (primary) hypertension: Secondary | ICD-10-CM

## 2018-06-21 MED ORDER — OLMESARTAN MEDOXOMIL 40 MG PO TABS: ORAL_TABLET | ORAL | 1 refills | Status: DC

## 2018-06-21 MED ORDER — HYDROCHLOROTHIAZIDE 25 MG PO TABS: ORAL_TABLET | ORAL | 1 refills | Status: DC

## 2018-06-30 ENCOUNTER — Other Ambulatory Visit: Payer: Self-pay | Admitting: *Deleted

## 2018-06-30 DIAGNOSIS — I10 Essential (primary) hypertension: Secondary | ICD-10-CM

## 2018-06-30 MED ORDER — OLMESARTAN MEDOXOMIL 40 MG PO TABS: ORAL_TABLET | ORAL | 1 refills | Status: DC

## 2018-06-30 MED ORDER — HYDROCHLOROTHIAZIDE 25 MG PO TABS: ORAL_TABLET | ORAL | 1 refills | Status: DC

## 2018-07-21 ENCOUNTER — Telehealth: Payer: Self-pay | Admitting: *Deleted

## 2018-07-21 NOTE — Telephone Encounter (Signed)
Daughter called and reported the patient's left hip pain has worsened. Per Dr Melford Aase, the patient can be referred to an orthopedist.  The daughter is aware.

## 2018-08-02 ENCOUNTER — Encounter: Payer: Self-pay | Admitting: Orthopaedic Surgery

## 2018-08-02 ENCOUNTER — Ambulatory Visit (INDEPENDENT_AMBULATORY_CARE_PROVIDER_SITE_OTHER): Payer: Medicare HMO

## 2018-08-02 ENCOUNTER — Ambulatory Visit (INDEPENDENT_AMBULATORY_CARE_PROVIDER_SITE_OTHER): Payer: Medicare HMO | Admitting: Orthopaedic Surgery

## 2018-08-02 ENCOUNTER — Other Ambulatory Visit: Payer: Self-pay

## 2018-08-02 VITALS — BP 154/52 | HR 56 | Ht 61.0 in | Wt 110.0 lb

## 2018-08-02 DIAGNOSIS — M25552 Pain in left hip: Secondary | ICD-10-CM

## 2018-08-02 DIAGNOSIS — M5136 Other intervertebral disc degeneration, lumbar region: Secondary | ICD-10-CM

## 2018-08-02 DIAGNOSIS — I77819 Aortic ectasia, unspecified site: Secondary | ICD-10-CM | POA: Diagnosis not present

## 2018-08-02 NOTE — Progress Notes (Signed)
Office Visit Note   Patient: Stacy Wallace           Date of Birth: Sep 03, 1945           MRN: [id number] Visit Date: 08/02/2018              Requested by: Unk Pinto, [address] [place],  [place] [address] PCP: Unk Pinto, MD   Assessment & Plan: Visit Diagnoses:  1. Pain in left hip   2. DDD (degenerative disc disease), lumbar   3. Widening aorta [facility])     Plan: Long discussion with Stacy Wallace and her daughter regarding Stacy Wallace symptoms.  I think there is a reasonable chance that her pain is radiating from her lumbar spine with the degenerative changes and probable stenosis.  I will order an MRI of her lumbar spine and have her return for further evaluation Office visit over 45 minutes 50% of the time in counseling.  Also order ultrasound of the abdominal aorta Follow-Up Instructions: Return After MRI scan lumbar spine.   Orders:  Orders Placed This Encounter  Procedures  . XR Lumbar Spine 2-3 Views  . XR Pelvis 1-2 Views  . MR Lumbar Spine w/o contrast  . US AORTA   No orders of the defined types were placed in this encounter.     Procedures: No procedures performed   Clinical Data: No additional findings.   Subjective: Chief Complaint  Patient presents with  . Left Hip - Pain  Patient presents today for left hip pain that has bothered her for years. She said that it has progressed over time. She states that her pain is located in her left buttock and radiates down her leg. She said that it feels better if she crosses her left leg over her right leg. She has fallen in the past due to weakness in her left leg. She has an MRI a couple years ago, but it is not in our system. She takes Aleve if needed.  Stacy Wallace is accompanied by her daughter.  Stacy Wallace has a history of "dementia" according to her daughter and has had some difficulty voicing the specifics of her pain.  She has had trouble for "years" and seems to localize a lot  of her pain along the lateral aspect of her left hip.  Although, recently she has been having some pain in her left buttock.  She fell about 5 years ago and there was a question as to whether or not she had an MRI scan of either her back or her hip.  I cannot find any record of scan of her back or hip in the [facility].  She has had an MRI of her brain and cervical spine in the past. Occasionally she has had some numbness in her left leg.  She does not use any ambulatory aid.  Denies groin pain.  Not having any symptoms on the right  HPI  Review of Systems  Constitutional: Negative for fatigue.  HENT: Negative for ear pain.   Respiratory: Negative for shortness of breath.   Cardiovascular: Negative for leg swelling.  Gastrointestinal: Negative for constipation and diarrhea.  Endocrine: Negative for cold intolerance and heat intolerance.  Genitourinary: Negative for difficulty urinating.  Musculoskeletal: Negative for joint swelling.  Skin: Negative for rash.  Allergic/Immunologic: Negative for food allergies.  Neurological: Positive for weakness.  Hematological: Does not bruise/bleed easily.  Psychiatric/Behavioral: Positive for sleep disturbance.     Objective: Vital  Signs: BP (!) 154/52   Pulse (!) 56   Ht 5\' 1"  (1.549 m)   Wt 110 lb (49.9 kg)   BMI 20.78 kg/m   Physical Exam Constitutional:      Appearance: She is well-developed.  Eyes:     Pupils: Pupils are equal, round, and reactive to light.  Pulmonary:     Effort: Pulmonary effort is normal.  Skin:    General: Skin is warm and dry.  Neurological:     Mental Status: She is alert and oriented to person, place, and time.  Psychiatric:        Behavior: Behavior normal.     Ortho Exam has history of dementia according to her daughter.  She seemed appropriate today although she had some difficulty being specific about her symptoms.  Straight leg raise was negative.  Painless range of motion of both hips without loss  of motion.  No localized tenderness over the ischial tuberosity or the greater trochanter of her left hip.  No pain on the right.  Minimal percussible tenderness along the sacrum and lumbar spine.  Feet were a little cool with minimal pulses.  No edema  Specialty Comments:  No specialty comments available.  Imaging: Xr Lumbar Spine 2-3 Views  Result Date: 08/02/2018 Films of the lumbar spine were obtained in several projections.  There is significant degenerative disc disease at L5-S1 with narrowing of the disc space.  There are some degenerative changes at L3-4 and L4-5 but disc spaces are still wide.  There is calcification of the abdominal aorta of the.  The widest section was at the level of L3 was about 30 mm facet joint arthropathy at L4-5 and L5-S1.  No significance scoliosis  Xr Pelvis 1-2 Views  Result Date: 08/02/2018 AP the pelvis demonstrates some degenerative changes of both hips with subchondral cysts and mild subchondral sclerosis.  The joint spaces are minimally narrowed.  There is some irregularity along the greater trochanters bilaterally but patient is not having pain over either the right or left hip.  Sacroiliac joints have some sclerosis    PMFS History: Patient Active Problem List   Diagnosis Date Noted  . Vascular dementia without behavioral disturbance ([place]) 05/30/2018  . Prediabetes 05/30/2018  . Alzheimer disease ([place]) 02/22/2018  . Aortic atherosclerosis ([place]) 12/22/2016  . DDD (degenerative disc disease), lumbar 03/12/2014  . Insomnia 02/08/2014  . Medication management 05/08/2013  . Hyperlipidemia, mixed   . Hypertension   . Hypothyroidism   . Abnormal glucose   . Vitamin D deficiency   . GERD (gastroesophageal reflux disease)    Past Medical History:  Diagnosis Date  . GERD (gastroesophageal reflux disease)   . Hyperlipidemia   . Hypertension   . Other abnormal glucose   . Unspecified hypothyroidism   . Unspecified vitamin D deficiency      Family History  Problem Relation Age of Onset  . Hypertension Mother   . Hyperlipidemia Mother   . Heart disease Father   . Alcohol abuse Father   . Hyperlipidemia Father   . Hypertension Father   . Heart disease Sister   . Stroke Sister   . Depression Sister   . Heart disease Brother     Past Surgical History:  Procedure Laterality Date  . ABDOMINAL HYSTERECTOMY    . CARPAL TUNNEL RELEASE Bilateral 2001  . EYE SURGERY    . NASAL SEPTUM SURGERY    . TUBAL LIGATION     Social History  Occupational History  . Not on file  Tobacco Use  . Smoking status: Former Smoker    Quit date: 03/20/1988    Years since quitting: 30.3  . Smokeless tobacco: Never Used  Substance and Sexual Activity  . Alcohol use: Yes    Comment: rare glass of wine  . Drug use: Not on file  . Sexual activity: Not on file

## 2018-08-04 ENCOUNTER — Telehealth: Payer: Self-pay | Admitting: Orthopaedic Surgery

## 2018-08-04 NOTE — Telephone Encounter (Signed)
Please advise 

## 2018-08-04 NOTE — Telephone Encounter (Signed)
Patient lives more conveniently to [place]. Daughter requesting for [place], and MRI to be done at a [facility] in [place] if possible. Please call to advise.

## 2018-08-15 NOTE — Telephone Encounter (Signed)
This has been taking care of.

## 2018-08-21 ENCOUNTER — Ambulatory Visit (INDEPENDENT_AMBULATORY_CARE_PROVIDER_SITE_OTHER): Payer: Medicare HMO

## 2018-08-21 ENCOUNTER — Other Ambulatory Visit: Payer: Self-pay

## 2018-08-21 DIAGNOSIS — M25552 Pain in left hip: Secondary | ICD-10-CM | POA: Diagnosis not present

## 2018-08-21 DIAGNOSIS — M5136 Other intervertebral disc degeneration, lumbar region: Secondary | ICD-10-CM

## 2018-08-21 DIAGNOSIS — M545 Low back pain: Secondary | ICD-10-CM | POA: Diagnosis not present

## 2018-08-24 ENCOUNTER — Other Ambulatory Visit: Payer: Self-pay | Admitting: *Deleted

## 2018-08-24 ENCOUNTER — Telehealth: Payer: Self-pay | Admitting: Orthopaedic Surgery

## 2018-08-24 DIAGNOSIS — I77819 Aortic ectasia, unspecified site: Secondary | ICD-10-CM

## 2018-08-24 NOTE — Telephone Encounter (Signed)
Will call.

## 2018-08-24 NOTE — Telephone Encounter (Signed)
Please advise 

## 2018-08-24 NOTE — Telephone Encounter (Signed)
Called and discussed with daughter

## 2018-08-24 NOTE — Telephone Encounter (Signed)
Claiborne Billings, patient's daughter left a voicemail stating she had MRI on 08/21/18 and is requesting the results over the phone if possible.  [facility] [phone]

## 2018-08-24 NOTE — Telephone Encounter (Signed)
Stacy Wallace is working on the reason ultrasound has not been done yet.

## 2018-08-26 ENCOUNTER — Other Ambulatory Visit: Payer: Self-pay

## 2018-08-26 ENCOUNTER — Ambulatory Visit (INDEPENDENT_AMBULATORY_CARE_PROVIDER_SITE_OTHER): Payer: Medicare HMO

## 2018-08-26 DIAGNOSIS — I700 Atherosclerosis of aorta: Secondary | ICD-10-CM | POA: Diagnosis not present

## 2018-08-26 DIAGNOSIS — I77819 Aortic ectasia, unspecified site: Secondary | ICD-10-CM | POA: Diagnosis not present

## 2018-08-29 NOTE — Progress Notes (Signed)
Assessment and Plan:  Hypertension:  -Continue medication,  -monitor blood pressure at home for low blood pressure, no symptoms at this time, goal is systolic of 123456 -Continue DASH diet.   -Reminder to go to the ER if any CP, SOB, nausea, dizziness, severe HA, changes vision/speech, left arm numbness and tingling, and jaw pain.  Cholesterol: -Continue diet and exercise.  -Check cholesterol.   Pre-diabetes: -Continue diet and exercise.  -Check A1C  Vitamin D Def: -check level -continue medications.    Continue diet and meds as discussed. Further disposition pending results of labs. Future Appointments  Date Time Provider [place]  10/17/2018  1:00 PM Star Age, MD [facility] None  12/07/2018  2:00 PM Unk Pinto, MD [facility] None    HPI 73 y.o. female  presents for 3 month follow up with hypertension, hyperlipidemia, prediabetes and vitamin D.   Her blood pressure has been controlled at home with change in medications, today their BP is BP: 134/72.   She does workout. She denies chest pain, shortness of breath, dizziness.   She is on cholesterol medication and denies myalgias. Her cholesterol is at goal. The cholesterol last visit was:   Lab Results  Component Value Date   CHOL 173 02/22/2018   HDL 71 02/22/2018   LDLCALC 81 02/22/2018   TRIG 110 02/22/2018   CHOLHDL 2.4 02/22/2018    She has been working on diet and exercise for prediabetes, and denies foot ulcerations, hyperglycemia, hypoglycemia , increased appetite, nausea, paresthesia of the feet, polydipsia, polyuria, visual disturbances, vomiting and weight loss. Last A1C in the office was:  Lab Results  Component Value Date   HGBA1C 5.7 (H) 02/22/2018   Patient is on Vitamin D supplement.  Lab Results  Component Value Date   VD25OH 61 11/11/2017     Following with Dr. Mina Marble for neck and back pain.     Current Medications:  Current Outpatient Medications on File Prior to Visit  Medication  Sig Dispense Refill  . atenolol (TENORMIN) 100 MG tablet Take 1 tablet Daily for BP 90 tablet 1  . benazepril (LOTENSIN) 20 MG tablet Take 1 tablet (20mg ) at night for BP 90 tablet 1  . BIOTIN PO Take 1 capsule by mouth daily.    Marland Kitchen donepezil (ARICEPT) 10 MG tablet Take 1 tablet Daily for Memory 90 tablet 3  . hydrochlorothiazide (HYDRODIURIL) 25 MG tablet Take 1 tablet daily for BP and fluid 90 tablet 1  . levothyroxine (SYNTHROID) 50 MCG tablet TAKE 1 TABLET EVERY DAY BEFORE BREAKFAST 90 tablet 1  . Magnesium 500 MG TABS Take 500 mg by mouth daily.    . meloxicam (MOBIC) 15 MG tablet Take 1/2 to 1 tablet Daily with food for Pain & Inflammation & limit to 4 to 5 days /week to Avoid Kidney Damage 90 tablet 1  . olmesartan (BENICAR) 40 MG tablet Take 1 tablet Daily for BP 90 tablet 1  . omeprazole (PRILOSEC) 20 MG capsule Take 1 capsule Daily for Acid Indigestion & Reflux 90 capsule 3  . rosuvastatin (CRESTOR) 40 MG tablet Take 1 tablet Daily for Cholesterol 90 tablet 1   No current facility-administered medications on file prior to visit.     Medical History:  Past Medical History:  Diagnosis Date  . GERD (gastroesophageal reflux disease)   . Hyperlipidemia   . Hypertension   . Other abnormal glucose   . Unspecified hypothyroidism   . Unspecified vitamin D deficiency     Allergies:  Allergies  Allergen Reactions  . Lopid [Gemfibrozil]     headache  . Morphine And Related Other (See Comments)    Reaction: unknown   . Nickel Other (See Comments)    Reaction: unknown   . Latex Rash     Review of Systems:  Review of Systems  Constitutional: Negative for chills, fever and malaise/fatigue.  HENT: Negative for congestion, ear pain and sore throat.   Eyes: Negative.   Respiratory: Negative for cough, shortness of breath and wheezing.   Cardiovascular: Negative for chest pain, palpitations and leg swelling.  Gastrointestinal: Negative for abdominal pain, blood in stool,  constipation, diarrhea, heartburn and melena.  Genitourinary: Negative.   Musculoskeletal: Positive for back pain, joint pain and neck pain.  Skin: Negative.   Neurological: Negative for dizziness, sensory change, loss of consciousness and headaches.  Psychiatric/Behavioral: Negative for depression. The patient is not nervous/anxious and does not have insomnia.     Family history- Review and unchanged  Social history- Review and unchanged  Physical Exam: BP 134/72   Pulse 68   Temp 97.7 F (36.5 C)   Ht 5\' 1"  (1.549 m)   Wt 113 lb 3.2 oz (51.3 kg)   SpO2 99%   BMI 21.39 kg/m  Wt Readings from Last 3 Encounters:  08/31/18 113 lb 3.2 oz (51.3 kg)  08/02/18 110 lb (49.9 kg)  05/31/18 113 lb (51.3 kg)    General Appearance: Well nourished well developed, in no apparent distress. Eyes: PERRLA, EOMs, conjunctiva no swelling or erythema ENT/Mouth: Ear canals normal without obstruction, swelling, erythma, discharge.  TMs normal bilaterally.  Oropharynx moist, clear, without exudate, or postoropharyngeal swelling. Slight depapillation of tongue, smooth, slightly erythematous but no obvious oral lesions Neck: Supple, thyroid normal,no cervical adenopathy  Respiratory: Respiratory effort normal, Breath sounds clear A&P without rhonchi, wheeze, or rale.  No retractions, no accessory usage. Cardio: RRR with prominent S2. Brisk peripheral pulses without edema.  Abdomen: Soft, + BS,  Non tender, no guarding, rebound, hernias, masses. Musculoskeletal: Full ROM, 5/5 strength, Normal gait Skin: Warm, dry without rashes, lesions, ecchymosis.  Neuro: Awake and oriented X 3, Cranial nerves intact. Normal muscle tone, no cerebellar symptoms. Psych: Normal affect, Insight and Judgment appropriate.    Vicie Mutters, PA-C 3:08 PM [facility]

## 2018-08-30 ENCOUNTER — Other Ambulatory Visit: Payer: Self-pay | Admitting: Internal Medicine

## 2018-08-31 ENCOUNTER — Ambulatory Visit (INDEPENDENT_AMBULATORY_CARE_PROVIDER_SITE_OTHER): Payer: Medicare HMO | Admitting: Physician Assistant

## 2018-08-31 ENCOUNTER — Telehealth: Payer: Self-pay | Admitting: Orthopaedic Surgery

## 2018-08-31 ENCOUNTER — Other Ambulatory Visit: Payer: Self-pay

## 2018-08-31 ENCOUNTER — Encounter: Payer: Self-pay | Admitting: Physician Assistant

## 2018-08-31 VITALS — BP 134/72 | HR 68 | Temp 97.7°F | Ht 61.0 in | Wt 113.2 lb

## 2018-08-31 DIAGNOSIS — G300 Alzheimer's disease with early onset: Secondary | ICD-10-CM

## 2018-08-31 DIAGNOSIS — I700 Atherosclerosis of aorta: Secondary | ICD-10-CM

## 2018-08-31 DIAGNOSIS — I10 Essential (primary) hypertension: Secondary | ICD-10-CM | POA: Diagnosis not present

## 2018-08-31 DIAGNOSIS — Z79899 Other long term (current) drug therapy: Secondary | ICD-10-CM | POA: Diagnosis not present

## 2018-08-31 DIAGNOSIS — E559 Vitamin D deficiency, unspecified: Secondary | ICD-10-CM | POA: Diagnosis not present

## 2018-08-31 DIAGNOSIS — E782 Mixed hyperlipidemia: Secondary | ICD-10-CM

## 2018-08-31 DIAGNOSIS — E039 Hypothyroidism, unspecified: Secondary | ICD-10-CM

## 2018-08-31 DIAGNOSIS — F0150 Vascular dementia without behavioral disturbance: Secondary | ICD-10-CM | POA: Diagnosis not present

## 2018-08-31 DIAGNOSIS — R7309 Other abnormal glucose: Secondary | ICD-10-CM | POA: Diagnosis not present

## 2018-08-31 DIAGNOSIS — F0280 Dementia in other diseases classified elsewhere without behavioral disturbance: Secondary | ICD-10-CM

## 2018-08-31 NOTE — Telephone Encounter (Signed)
Please call and relate there was no evidence of an aneurysm

## 2018-08-31 NOTE — Telephone Encounter (Signed)
Please advise 

## 2018-08-31 NOTE — Telephone Encounter (Signed)
Patient's daughter Stacy Wallace called stating her mom had her ultrasound on Friday, 08/26/18 and is requesting a return call with the results.

## 2018-08-31 NOTE — Telephone Encounter (Signed)
Called number provided. No answer. Left message with results.

## 2018-08-31 NOTE — Patient Instructions (Addendum)
Testing/Procedures: HOW TO TAKE YOUR BLOOD PRESSURE:  Rest 5 minutes before taking your blood pressure.  Don't smoke or drink caffeinated beverages for at least 30 minutes before.  Take your blood pressure before (not after) you eat.  Sit comfortably with your back supported and both feet on the floor (don't cross your legs).  Elevate your arm to heart level on a table or a desk.  Use the proper sized cuff. It should fit smoothly and snugly around your bare upper arm. There should be enough room to slip a fingertip under the cuff. The bottom edge of the cuff should be 1 inch above the crease of the elbow.  HYPERTENSION INFORMATION  Monitor your blood pressure at home, please keep a record and bring that in with you to your next office visit.   Go to the ER if any CP, SOB, nausea, dizziness, severe HA, changes vision/speech  Due to a recent study, SPRINT, we have changed our goal for the systolic or top blood pressure number. Ideally we want your top number at 120.  In the [facility] Trial, 5000 people were randomized to a goal BP of 120 and 5000 people were randomized to a goal BP of less than 140. The patients with the goal BP at 120 had LESS DEMENTIA, LESS HEART ATTACKS, AND LESS STROKES, AS WELL AS OVERALL DECREASED MORTALITY OR DEATH RATE.   There was another study that showed taking your blood pressure medications at night decrease cardiovascular events.  However if you are on a fluid pill, please take this in the morning.   If you are willing, our goal BP is the top number of 120.  Your most recent BP: BP: 134/72   Take your medications faithfully as instructed. Maintain a healthy weight. Get at least 150 minutes of aerobic exercise per week.   Please monitor your blood pressure. If it is getting below 100/80 AND you are having fatigue with exertion, dizziness we may need to cut your blood pressure medication in half. Please call the office if this is happening. Hypotension As  your heart beats, it forces blood through your body. This force is called blood pressure. If you have hypotension, you have low blood pressure. When your blood pressure is too low, you may not get enough blood to your brain. You may feel weak, feel lightheaded, have a fast heartbeat, or even pass out (faint). HOME CARE  Drink enough fluids to keep your pee (urine) clear or pale yellow.  Take all medicines as told by your doctor.  Get up slowly after sitting or lying down.  Wear support stockings as told by your doctor.  Maintain a healthy diet by including foods such as fruits, vegetables, nuts, whole grains, and lean meats. GET HELP IF:  You are throwing up (vomiting) or have watery poop (diarrhea).  You have a fever for more than 2-3 days.  You feel more thirsty than usual.  You feel weak and tired. GET HELP RIGHT AWAY IF:   You pass out (faint).  You have chest pain or a fast or irregular heartbeat.  You lose feeling in part of your body.  You cannot move your arms or legs.  You have trouble speaking.  You get sweaty or feel lightheaded. MAKE SURE YOU:   Understand these instructions.  Will watch your condition.  Will get help right away if you are not doing well or get worse. Document Released: 03/18/2009 Document Revised: 08/24/2012 Document Reviewed: 06/24/2012 Exit[employer] Patient Information  2015 [employer]. This information is not intended to replace advice given to you by your health care provider. Make sure you discuss any questions you have with your health care provider.  

## 2018-09-01 LAB — CBC WITH DIFFERENTIAL/PLATELET
Absolute Monocytes: 442 cells/uL (ref 200–950)
Basophils Absolute: 28 cells/uL (ref 0–200)
Basophils Relative: 0.3 %
Eosinophils Absolute: 169 cells/uL (ref 15–500)
Eosinophils Relative: 1.8 %
HCT: 39.6 % (ref 35.0–45.0)
Hemoglobin: 13.2 g/dL (ref 11.7–15.5)
Lymphs Abs: 2406 cells/uL (ref 850–3900)
MCH: 29.9 pg (ref 27.0–33.0)
MCHC: 33.3 g/dL (ref 32.0–36.0)
MCV: 89.6 fL (ref 80.0–100.0)
MPV: 10.1 fL (ref 7.5–12.5)
Monocytes Relative: 4.7 %
Neutro Abs: 6354 cells/uL (ref 1500–7800)
Neutrophils Relative %: 67.6 %
Platelets: 159 10*3/uL (ref 140–400)
RBC: 4.42 10*6/uL (ref 3.80–5.10)
RDW: 13.4 % (ref 11.0–15.0)
Total Lymphocyte: 25.6 %
WBC: 9.4 10*3/uL (ref 3.8–10.8)

## 2018-09-01 LAB — COMPLETE METABOLIC PANEL WITH GFR
AG Ratio: 2.1 (calc) (ref 1.0–2.5)
ALT: 16 U/L (ref 6–29)
AST: 26 U/L (ref 10–35)
Albumin: 4.6 g/dL (ref 3.6–5.1)
Alkaline phosphatase (APISO): 51 U/L (ref 37–153)
BUN/Creatinine Ratio: 19 (calc) (ref 6–22)
BUN: 28 mg/dL — ABNORMAL HIGH (ref 7–25)
CO2: 30 mmol/L (ref 20–32)
Calcium: 10 mg/dL (ref 8.6–10.4)
Chloride: 101 mmol/L (ref 98–110)
Creat: 1.46 mg/dL — ABNORMAL HIGH (ref 0.60–0.93)
GFR, Est African American: 40 mL/min/{1.73_m2} — ABNORMAL LOW (ref >=60)
GFR, Est Non African American: 35 mL/min/{1.73_m2} — ABNORMAL LOW (ref >=60)
Globulin: 2.2 g/dL (calc) (ref 1.9–3.7)
Glucose, Bld: 104 mg/dL — ABNORMAL HIGH (ref 65–99)
Potassium: 3.5 mmol/L (ref 3.5–5.3)
Sodium: 140 mmol/L (ref 135–146)
Total Bilirubin: 0.6 mg/dL (ref 0.2–1.2)
Total Protein: 6.8 g/dL (ref 6.1–8.1)

## 2018-09-01 LAB — TSH: TSH: 1.55 mIU/L (ref 0.40–4.50)

## 2018-09-01 LAB — LIPID PANEL
Cholesterol: 148 mg/dL (ref <200)
HDL: 70 mg/dL (ref >=50)
LDL Cholesterol (Calc): 63 mg/dL (calc)
Non-HDL Cholesterol (Calc): 78 mg/dL (calc) (ref <130)
Total CHOL/HDL Ratio: 2.1 (calc) (ref <5.0)
Triglycerides: 72 mg/dL (ref <150)

## 2018-09-01 LAB — HEMOGLOBIN A1C
Hgb A1c MFr Bld: 5.8 % of total Hgb — ABNORMAL HIGH (ref <5.7)
Mean Plasma Glucose: 120 (calc)
eAG (mmol/L): 6.6 (calc)

## 2018-09-01 LAB — MAGNESIUM: Magnesium: 2.3 mg/dL (ref 1.5–2.5)

## 2018-10-10 ENCOUNTER — Encounter: Payer: Self-pay | Admitting: Internal Medicine

## 2018-10-17 ENCOUNTER — Ambulatory Visit: Payer: Medicare HMO | Admitting: Neurology

## 2018-10-19 ENCOUNTER — Other Ambulatory Visit: Payer: Self-pay | Admitting: Physician Assistant

## 2018-10-19 DIAGNOSIS — G4700 Insomnia, unspecified: Secondary | ICD-10-CM

## 2018-12-07 ENCOUNTER — Encounter: Payer: Self-pay | Admitting: Internal Medicine

## 2018-12-21 ENCOUNTER — Ambulatory Visit: Payer: Medicare HMO | Admitting: Neurology

## 2019-01-03 ENCOUNTER — Telehealth: Payer: Self-pay | Admitting: *Deleted

## 2019-01-03 NOTE — Telephone Encounter (Signed)
Daughter,Kelly, called and inquired about her mother and a recent request for Alprazolam refill.  The patient indicated [place] was going to request a refill. According to the chart, Alprazolam has been discontinued and no request has been received. Claiborne Billings was in agreement with no further Alprazolam refills.

## 2019-02-03 ENCOUNTER — Other Ambulatory Visit: Payer: Self-pay | Admitting: Neurology

## 2019-02-03 DIAGNOSIS — F0150 Vascular dementia without behavioral disturbance: Secondary | ICD-10-CM

## 2019-02-06 ENCOUNTER — Encounter: Payer: Self-pay | Admitting: Internal Medicine

## 2019-02-06 NOTE — Progress Notes (Signed)
Annual Screening/Preventative Visit & Comprehensive Evaluation &  Examination     This very nice 74 y.o. MWF presents for a Screening /Preventative Visit & comprehensive evaluation and management of multiple medical co-morbidities and is brought in by her daughterClaiborne Billings.  Patient has been followed for HTN, HLD, Prediabetes  and Vitamin D Deficiency. Patient's GERD is controlled on her meds.     Patient has moderate vascular Dementia & is also followed by Dr Rexene Alberts.                     HTN predates circa 1999.  Patient's BP has been controlled at home and patient denies any cardiac symptoms as chest pain, palpitations, shortness of breath, dizziness or ankle swelling. Today's BP is at goal - 130/80.      Patient has  hyperlipidemia  controlled with diet and medications. Patient denies myalgias or other medication SE's. Last lipids were at goal:  Lab Results  Component Value Date   CHOL 148 08/31/2018   HDL 70 08/31/2018   LDLCALC 63 08/31/2018   TRIG 72 08/31/2018   CHOLHDL 2.1 08/31/2018       Patient has hx/o prediabetes (A1c5.8% / 2011 & 5.9% / 2014) and patient denies reactive hypoglycemic symptoms, visual blurring, diabetic polys or paresthesias. Last A1c was not at goal:  Lab Results  Component Value Date   HGBA1C 5.8 (H) 08/31/2018       Patient was dx'd Hypothyroid in 2014 & initiated on thyroid replacement      Finally, patient has history of Vitamin D Deficiency and last Vitamin D was at goal:   Current Outpatient Medications on File Prior to Visit  Medication Sig  . Ascorbic Acid (VITAMIN C) 1000 MG tablet Take 1,000 mg by mouth daily.  Marland Kitchen atenolol (TENORMIN) 100 MG tablet Take 1 tablet Daily for BP  . benazepril (LOTENSIN) 20 MG tablet Take 1 tablet (20mg ) at night for BP  . donepezil (ARICEPT) 10 MG tablet 1 tablet every evening.  Marland Kitchen levothyroxine (SYNTHROID) 50 MCG tablet TAKE 1 TABLET EVERY DAY BEFORE BREAKFAST  . Magnesium 500 MG TABS Take 500 mg by mouth daily.  Taking a Magnesium/Potassium tablet.  Marland Kitchen olmesartan (BENICAR) 40 MG tablet Take 1 tablet Daily for BP  . omeprazole (PRILOSEC) 20 MG capsule Take 1 capsule Daily for Acid Indigestion & Reflux  . rosuvastatin (CRESTOR) 40 MG tablet Take 1 tablet Daily for Cholesterol  . zinc gluconate 50 MG tablet Take 50 mg by mouth daily.   No current facility-administered medications on file prior to visit.   Allergies  Allergen Reactions  . Lopid [Gemfibrozil]     headache  . Morphine And Related Other (See Comments)    Reaction: unknown   . Nickel Other (See Comments)    Reaction: unknown   . Latex Rash   Past Medical History:  Diagnosis Date  . GERD (gastroesophageal reflux disease)   . Hyperlipidemia   . Hypertension   . Other abnormal glucose   . Unspecified hypothyroidism   . Unspecified vitamin D deficiency    Health Maintenance  Topic Date Due  . Hepatitis C Screening  1945/12/04  . TETANUS/TDAP  11/08/2016  . INFLUENZA VACCINE  08/06/2018  . Fecal DNA (Cologuard)  03/07/2021  . DEXA SCAN  Completed  . PNA vac Low Risk Adult  Completed   Immunization History  Administered Date(s) Administered  . Influenza, High Dose Seasonal PF 09/19/2013, 10/16/2014, 10/08/2015, 11/30/2016  . Influenza-Unspecified 09/15/2012,  11/05/2016, 11/05/2017  . Pneumococcal Conjugate-13 08/08/2015  . Pneumococcal-Unspecified 03/18/2010  . Td 11/09/2006   Last Colon - 06/24/2006 - Dr Carlean Purl overdue 10 yr f/u since July 2018. Referral made & was scheduled for 01/06/2018 and Cologard on 04/04/2018 was negative & recc a 3 year f/u due Apr 2023  Last MGM - 12/27/2017 - overdue- patient's daughter to schedule  Past Surgical History:  Procedure Laterality Date  . ABDOMINAL HYSTERECTOMY    . CARPAL TUNNEL RELEASE Bilateral 2001  . EYE SURGERY    . NASAL SEPTUM SURGERY    . TUBAL LIGATION     Family History  Problem Relation Age of Onset  . Hypertension Mother   . Hyperlipidemia Mother   . Heart  disease Father   . Alcohol abuse Father   . Hyperlipidemia Father   . Hypertension Father   . Heart disease Sister   . Stroke Sister   . Depression Sister   . Heart disease Brother    Social History   Tobacco Use  . Smoking status: Former Smoker    Quit date: 03/20/1988    Years since quitting: 30.9  . Smokeless tobacco: Never Used  Substance Use Topics  . Alcohol use: Yes    Comment: rare glass of wine  . Drug use: Not on file    ROS Constitutional: Denies fever, chills, weight loss/gain, headaches, insomnia,  night sweats, and change in appetite. Does c/o fatigue. Eyes: Denies redness, blurred vision, diplopia, discharge, itchy, watery eyes.  ENT: Denies discharge, congestion, post nasal drip, epistaxis, sore throat, earache, hearing loss, dental pain, Tinnitus, Vertigo, Sinus pain, snoring.  Cardio: Denies chest pain, palpitations, irregular heartbeat, syncope, dyspnea, diaphoresis, orthopnea, PND, claudication, edema Respiratory: denies cough, dyspnea, DOE, pleurisy, hoarseness, laryngitis, wheezing.  Gastrointestinal: Denies dysphagia, heartburn, reflux, water brash, pain, cramps, nausea, vomiting, bloating, diarrhea, constipation, hematemesis, melena, hematochezia, jaundice, hemorrhoids Genitourinary: Denies dysuria, frequency, urgency, nocturia, hesitancy, discharge, hematuria, flank pain Breast: Breast lumps, nipple discharge, bleeding.  Musculoskeletal: Denies arthralgia, myalgia, stiffness, Jt. Swelling, pain, limp, and strain/sprain. Denies falls. Skin: Denies puritis, rash, hives, warts, acne, eczema, changing in skin lesion Neuro: No weakness, tremor, incoordination, spasms, paresthesia, pain Psychiatric: Denies confusion, memory loss, sensory loss. Denies Depression. Endocrine: Denies change in weight, skin, hair change, nocturia, and paresthesia, diabetic polys, visual blurring, hyper / hypo glycemic episodes.  Heme/Lymph: No excessive bleeding, bruising, enlarged  lymph nodes.  Physical Exam  BP 130/80   Pulse 64   Temp (!) 97.2 F (36.2 C)   Resp 16   Ht 5\' 1"  (1.549 m)   Wt 117 lb (53.1 kg)   BMI 22.11 kg/m   General Appearance: Well nourished, well groomed and in no apparent distress.  Eyes: PERRLA, EOMs, conjunctiva no swelling or erythema, normal fundi and vessels. Sinuses: No frontal/maxillary tenderness ENT/Mouth: EACs patent / TMs  nl. Nares clear without erythema, swelling, mucoid exudates. Oral hygiene is good. No erythema, swelling, or exudate. Tongue normal, non-obstructing. Tonsils not swollen or erythematous. Hearing normal.  Neck: Supple, thyroid not palpable. No bruits, nodes or JVD. Respiratory: Respiratory effort normal.  BS equal and clear bilateral without rales, rhonci, wheezing or stridor. Cardio: Heart sounds are normal with regular rate and rhythm and no murmurs, rubs or gallops. Peripheral pulses are normal and equal bilaterally without edema. No aortic or femoral bruits. Chest: symmetric with normal excursions and percussion. Breasts: Symmetric, without lumps, nipple discharge, retractions, or fibrocystic changes.  Abdomen: Flat, soft with bowel sounds active. Nontender,  no guarding, rebound, hernias, masses, or organomegaly.  Lymphatics: Non tender without lymphadenopathy.  Genitourinary:  Musculoskeletal: Full ROM all peripheral extremities, joint stability, 5/5 strength, and normal gait. Skin: Warm and dry without rashes, lesions, cyanosis, clubbing or  ecchymosis.  Neuro: Cranial nerves intact, reflexes equal bilaterally. Normal muscle tone, no cerebellar symptoms. Sensation intact.  Pysch: Alert and oriented X 3, normal affect, Insight and Judgment appropriate.   Assessment and Plan  1. Annual Preventative Screening Examination  2. Essential hypertension  - EKG 12-Lead - Urinalysis, Routine w reflex microscopic - Microalbumin / creatinine urine ratio - CBC with Differential/Platelet - COMPLETE METABOLIC  PANEL WITH GFR - Magnesium - TSH  3. Hyperlipidemia, mixed  - EKG 12-Lead - Lipid panel - TSH  4. Abnormal glucose  - EKG 12-Lead - Hemoglobin A1c - Insulin, random  5. Vitamin D deficiency  - VITAMIN D 25 Hydroxy  6. Hypothyroidism, unspecified type  - TSH  7. Vascular dementia without behavioral disturbance ([place])   8. Vitamin B12 deficiency  - Lipid panel - TSH  9. Screening for colorectal cancer  - POC Hemoccult Bld/Stl  10. Screening for ischemic heart disease  - EKG 12-Lead  11. FHx: heart disease  - EKG 12-Lead  12. Former smoker  - EKG 12-Lead  13. Aortic atherosclerosis ([facility])  - EKG 12-Lead  14. Medication management  - Urinalysis, Routine w reflex microscopic - Microalbumin / creatinine urine ratio - CBC with Differential/Platelet - COMPLETE METABOLIC PANEL WITH GFR - Magnesium - Lipid panel - TSH - Hemoglobin A1c - Insulin, random - VITAMIN D 25 Hydrox        Patient was counseled in prudent diet to achieve/maintain BMI less than 25 for weight control, BP monitoring, regular exercise and medications. Discussed med's effects and SE's. Screening labs and tests as requested with regular follow-up as recommended. Over 40 minutes of exam, counseling, chart review and high complex critical decision making was performed.   Kirtland Bouchard, MD

## 2019-02-06 NOTE — Patient Instructions (Addendum)
- Link to sign up at [employer] site for the Covid-19 vaccine   [email or web address].com/   Or call [phone] t D  &  ++++++++++++++++++++++++++++++++++ it C 1,000 mg   are recommended to help protect  against the Covid-19 and other Corona viruses.    Also it's recommended  to take  Zinc 50 mg  to help  protect against the Covid-19   and best place to get  is also on [employer]  and don't pay more than 6-8 cents /pill !  ================================ Coronavirus (COVID-19) Are you at risk?  Are you at risk for the Coronavirus (COVID-19)?  To be considered HIGH RISK for Coronavirus (COVID-19), you have to meet the following criteria:  . Traveled to [place], [place], [place], [place] or [place]; or in the [place] to [place], [place], [place]  . or [place]; and have fever, cough, and shortness of breath within the last 2 weeks of travel OR . Been in close contact with a person diagnosed with COVID-19 within the last 2 weeks and have  . fever, cough,and shortness of breath .  . IF YOU DO NOT MEET THESE CRITERIA, YOU ARE CONSIDERED LOW RISK FOR COVID-19.  What to do if you are HIGH RISK for COVID-19?  Marland Kitchen If you are having a medical emergency, call 911. . Seek medical care right away. Before you go to a doctor's office, urgent care or emergency department, .  call ahead and tell them about your recent travel, contact with someone diagnosed with COVID-19  .  and your symptoms.  . You should receive instructions from your physician's office regarding next steps of care.  . When you arrive at healthcare provider, tell the healthcare staff immediately you have returned from  . visiting [place], [place], [place], [place] or [place]; or traveled in the [place] to [place], [place],  . [place] or [place] in the last two weeks  or you have been in close contact with a person diagnosed with  . COVID-19 in the last 2 weeks.   . Tell the health care staff about your symptoms: fever, cough and shortness of breath. . After you have been seen by a medical provider, you will be either: o Tested for (COVID-19) and discharged home on quarantine except to seek medical care if  o symptoms worsen, and asked to  - Stay home and avoid contact with others until you get your results (4-5 days)  - Avoid travel on public transportation if possible (such as bus, train, or airplane) or o Sent to the Emergency Department by EMS for evaluation, COVID-19 testing  and  o possible admission depending on your condition and test results.  What to do if you are LOW RISK for COVID-19?  Reduce your risk of any infection by using the same precautions used for avoiding the common cold or flu:  Marland Kitchen Wash your hands often with soap and warm water for at least 20 seconds.  If soap and water are not readily available,  . use an alcohol-based hand sanitizer with at least 60% alcohol.  . If coughing or sneezing, cover your mouth and nose by coughing or sneezing into the elbow areas of your shirt or coat, .  into a tissue or into your sleeve (not your hands). . Avoid shaking hands with others and consider head nods or verbal greetings only. . Avoid touching your eyes, nose, or mouth with unwashed hands.  Marland Kitchen  Avoid close contact with people who are sick. . Avoid places or events with large numbers of people in one location, like concerts or sporting events. . Carefully consider travel plans you have or are making. . If you are planning any travel outside or inside the [place], visit the CDC's Travelers' Health webpage for the latest health notices. . If you have some symptoms but not all symptoms, continue to monitor at home and seek medical attention  . if your symptoms worsen. . If you are having a medical emergency, call  911.   . >>>>>>>>>>>>>>>>>>>>>>>>>>>>>>>>> . We Do NOT Approve of  [facility], [employer]  Our Patients  To Do Home Visits & We Do NOT Approve of [employer] > > > > > > > > > > > > > > > > > > > > > > > > > > > > > > > > > > > > > > >  Preventive Care for Adults  A healthy lifestyle and preventive care can promote health and wellness. Preventive health guidelines for women include the following key practices.  A routine yearly physical is a good way to check with your health care provider about your health and preventive screening. It is a chance to share any concerns and updates on your health and to receive a thorough exam.  Visit your dentist for a routine exam and preventive care every 6 months. Brush your teeth twice a day and floss once a day. Good oral hygiene prevents tooth decay and gum disease.  The frequency of eye exams is based on your age, health, family medical history, use of contact lenses, and other factors. Follow your health care provider's recommendations for frequency of eye exams.  Eat a healthy diet. Foods like vegetables, fruits, whole grains, low-fat dairy products, and lean protein foods contain the nutrients you need without too many calories. Decrease your intake of foods high in solid fats, added sugars, and salt. Eat the right amount of calories for you. Get information about a proper diet from your health care provider, if necessary.  Regular physical exercise is one of the most important things you can do for your health. Most adults should get at least 150 minutes of moderate-intensity exercise (any activity that increases your heart rate and causes you to sweat) each week. In addition, most adults need muscle-strengthening exercises on 2 or more days a week.  Maintain a healthy weight. The body mass index (BMI) is a screening tool to identify possible weight problems. It provides an estimate of body fat based on height and  weight. Your health care provider can find your BMI and can help you achieve or maintain a healthy weight. For adults 20 years and older:  A BMI below 18.5 is considered underweight.  A BMI of 18.5 to 24.9 is normal.  A BMI of 25 to 29.9 is considered overweight.  A BMI of 30 and above is considered obese.  Maintain normal blood lipids and cholesterol levels by exercising and minimizing your intake of saturated fat. Eat a balanced diet with plenty of fruit and vegetables. If your lipid or cholesterol levels are high, you are over 50, or you are at high risk for heart disease, you may need your cholesterol levels checked more frequently. Ongoing high lipid and cholesterol levels should be treated with medicines if diet and exercise are not working.  If you smoke, find out from your health care provider how to quit. If you  do not use tobacco, do not start.  Lung cancer screening is recommended for adults aged 55-80 years who are at high risk for developing lung cancer because of a history of smoking. A yearly low-dose CT scan of the lungs is recommended for people who have at least a 30-pack-year history of smoking and are a current smoker or have quit within the past 15 years. A pack year of smoking is smoking an average of 1 pack of cigarettes a day for 1 year (for example: 1 pack a day for 30 years or 2 packs a day for 15 years). Yearly screening should continue until the smoker has stopped smoking for at least 15 years. Yearly screening should be stopped for people who develop a health problem that would prevent them from having lung cancer treatment.  Avoid use of street drugs. Do not share needles with anyone. Ask for help if you need support or instructions about stopping the use of drugs.  High blood pressure causes heart disease and increases the risk of stroke.  Ongoing high blood pressure should be treated with medicines if weight loss and exercise do not work.  If you are 55-79 years  old, ask your health care provider if you should take aspirin to prevent strokes.  Diabetes screening involves taking a blood sample to check your fasting blood sugar level. This should be done once every 3 years, after age 45, if you are within normal weight and without risk factors for diabetes. Testing should be considered at a younger age or be carried out more frequently if you are overweight and have at least 1 risk factor for diabetes.  Breast cancer screening is essential preventive care for women. You should practice "breast self-awareness." This means understanding the normal appearance and feel of your breasts and may include breast self-examination. Any changes detected, no matter how small, should be reported to a health care provider. Women in their 20s and 30s should have a clinical breast exam (CBE) by a health care provider as part of a regular health exam every 1 to 3 years. After age 40, women should have a CBE every year. Starting at age 40, women should consider having a mammogram (breast X-ray test) every year. Women who have a family history of breast cancer should talk to their health care provider about genetic screening. Women at a high risk of breast cancer should talk to their health care providers about having an MRI and a mammogram every year.  Breast cancer gene (BRCA)-related cancer risk assessment is recommended for women who have family members with BRCA-related cancers. BRCA-related cancers include breast, ovarian, tubal, and peritoneal cancers. Having family members with these cancers may be associated with an increased risk for harmful changes (mutations) in the breast cancer genes BRCA1 and BRCA2. Results of the assessment will determine the need for genetic counseling and BRCA1 and BRCA2 testing.  Routine pelvic exams to screen for cancer are no longer recommended for nonpregnant women who are considered low risk for cancer of the pelvic organs (ovaries, uterus, and  vagina) and who do not have symptoms. Ask your health care provider if a screening pelvic exam is right for you.  If you have had past treatment for cervical cancer or a condition that could lead to cancer, you need Pap tests and screening for cancer for at least 20 years after your treatment. If Pap tests have been discontinued, your risk factors (such as having a new sexual partner) need to be   reassessed to determine if screening should be resumed. Some women have medical problems that increase the chance of getting cervical cancer. In these cases, your health care provider may recommend more frequent screening and Pap tests.    Colorectal cancer can be detected and often prevented. Most routine colorectal cancer screening begins at the age of 50 years and continues through age 75 years. However, your health care provider may recommend screening at an earlier age if you have risk factors for colon cancer. On a yearly basis, your health care provider may provide home test kits to check for hidden blood in the stool. Use of a small camera at the end of a tube, to directly examine the colon (sigmoidoscopy or colonoscopy), can detect the earliest forms of colorectal cancer. Talk to your health care provider about this at age 50, when routine screening begins.  Direct exam of the colon should be repeated every 5-10 years through age 75 years, unless early forms of pre-cancerous polyps or small growths are found.  Osteoporosis is a disease in which the bones lose minerals and strength with aging. This can result in serious bone fractures or breaks. The risk of osteoporosis can be identified using a bone density scan. Women ages 65 years and over and women at risk for fractures or osteoporosis should discuss screening with their health care providers. Ask your health care provider whether you should take a calcium supplement or vitamin D to reduce the rate of osteoporosis.  Menopause can be associated with  physical symptoms and risks. Hormone replacement therapy is available to decrease symptoms and risks. You should talk to your health care provider about whether hormone replacement therapy is right for you.  Use sunscreen. Apply sunscreen liberally and repeatedly throughout the day. You should seek shade when your shadow is shorter than you. Protect yourself by wearing long sleeves, pants, a wide-brimmed hat, and sunglasses year round, whenever you are outdoors.  Once a month, do a whole body skin exam, using a mirror to look at the skin on your back. Tell your health care provider of new moles, moles that have irregular borders, moles that are larger than a pencil eraser, or moles that have changed in shape or color.  Stay current with required vaccines (immunizations).  Influenza vaccine. All adults should be immunized every year.  Tetanus, diphtheria, and acellular pertussis (Td, Tdap) vaccine. Pregnant women should receive 1 dose of Tdap vaccine during each pregnancy. The dose should be obtained regardless of the length of time since the last dose. Immunization is preferred during the 27th-36th week of gestation. An adult who has not previously received Tdap or who does not know her vaccine status should receive 1 dose of Tdap. This initial dose should be followed by tetanus and diphtheria toxoids (Td) booster doses every 10 years. Adults with an unknown or incomplete history of completing a 3-dose immunization series with Td-containing vaccines should begin or complete a primary immunization series including a Tdap dose. Adults should receive a Td booster every 10 years.    Zoster vaccine. One dose is recommended for adults aged 60 years or older unless certain conditions are present.    Pneumococcal 13-valent conjugate (PCV13) vaccine. When indicated, a person who is uncertain of her immunization history and has no record of immunization should receive the PCV13 vaccine. An adult aged 19  years or older who has certain medical conditions and has not been previously immunized should receive 1 dose of PCV13 vaccine. This PCV13   should be followed with a dose of pneumococcal polysaccharide (PPSV23) vaccine. The PPSV23 vaccine dose should be obtained at least 1 or more year(s) after the dose of PCV13 vaccine. An adult aged 19 years or older who has certain medical conditions and previously received 1 or more doses of PPSV23 vaccine should receive 1 dose of PCV13. The PCV13 vaccine dose should be obtained 1 or more years after the last PPSV23 vaccine dose.    Pneumococcal polysaccharide (PPSV23) vaccine. When PCV13 is also indicated, PCV13 should be obtained first. All adults aged 65 years and older should be immunized. An adult younger than age 65 years who has certain medical conditions should be immunized. Any person who resides in a nursing home or long-term care facility should be immunized. An adult smoker should be immunized. People with an immunocompromised condition and certain other conditions should receive both PCV13 and PPSV23 vaccines. People with human immunodeficiency virus (HIV) infection should be immunized as soon as possible after diagnosis. Immunization during chemotherapy or radiation therapy should be avoided. Routine use of PPSV23 vaccine is not recommended for American Indians, [place] Natives, or people younger than 65 years unless there are medical conditions that require PPSV23 vaccine. When indicated, people who have unknown immunization and have no record of immunization should receive PPSV23 vaccine. One-time revaccination 5 years after the first dose of PPSV23 is recommended for people aged 19-64 years who have chronic kidney failure, nephrotic syndrome, asplenia, or immunocompromised conditions. People who received 1-2 doses of PPSV23 before age 65 years should receive another dose of PPSV23 vaccine at age 65 years or later if at least 5 years have passed since the  previous dose. Doses of PPSV23 are not needed for people immunized with PPSV23 at or after age 65 years.   Preventive Services / Frequency  Ages 65 years and over  Blood pressure check.  Lipid and cholesterol check.  Lung cancer screening. / Every year if you are aged 55-80 years and have a 30-pack-year history of smoking and currently smoke or have quit within the past 15 years. Yearly screening is stopped once you have quit smoking for at least 15 years or develop a health problem that would prevent you from having lung cancer treatment.  Clinical breast exam.** / Every year after age 40 years.   BRCA-related cancer risk assessment.** / For women who have family members with a BRCA-related cancer (breast, ovarian, tubal, or peritoneal cancers).  Mammogram.** / Every year beginning at age 40 years and continuing for as long as you are in good health. Consult with your health care provider.  Pap test.** / Every 3 years starting at age 30 years through age 65 or 70 years with 3 consecutive normal Pap tests. Testing can be stopped between 65 and 70 years with 3 consecutive normal Pap tests and no abnormal Pap or HPV tests in the past 10 years.  Fecal occult blood test (FOBT) of stool. / Every year beginning at age 50 years and continuing until age 75 years. You may not need to do this test if you get a colonoscopy every 10 years.  Flexible sigmoidoscopy or colonoscopy.** / Every 5 years for a flexible sigmoidoscopy or every 10 years for a colonoscopy beginning at age 50 years and continuing until age 75 years.  Hepatitis C blood test.** / For all people born from 1945 through 1965 and any individual with known risks for hepatitis C.  Osteoporosis screening.** / A one-time screening for women ages 65   years and over and women at risk for fractures or osteoporosis.  Skin self-exam. / Monthly.  Influenza vaccine. / Every year.  Tetanus, diphtheria, and acellular pertussis (Tdap/Td)  vaccine.** / 1 dose of Td every 10 years.  Zoster vaccine.** / 1 dose for adults aged 60 years or older.  Pneumococcal 13-valent conjugate (PCV13) vaccine.** / Consult your health care provider.  Pneumococcal polysaccharide (PPSV23) vaccine.** / 1 dose for all adults aged 65 years and older. Screening for abdominal aortic aneurysm (AAA)  by ultrasound is recommended for people who have history of high blood pressure or who are current or former smokers. ++++++++++++++++++++ Recommend Adult Low Dose Aspirin or  coated  Aspirin 81 mg daily  To reduce risk of Colon Cancer 40 %,  Skin Cancer 26 % ,  Melanoma 46%  and  Pancreatic cancer 60% ++++++++++++++++++++ Vitamin D goal  is between 70-100.  Please make sure that you are taking your Vitamin D as directed.  It is very important as a natural anti-inflammatory  helping hair, skin, and nails, as well as reducing stroke and heart attack risk.  It helps your bones and helps with mood. It also decreases numerous cancer risks so please take it as directed.  Low Vit D is associated with a 200-300% higher risk for CANCER  and 200-300% higher risk for HEART   ATTACK  &  STROKE.   ...................................... It is also associated with higher death rate at younger ages,  autoimmune diseases like Rheumatoid arthritis, Lupus, Multiple Sclerosis.    Also many other serious conditions, like depression, Alzheimer's Dementia, infertility, muscle aches, fatigue, fibromyalgia - just to name a few. ++++++++++++++++++ Recommend the book "The END of DIETING" by Dr Joel Fuhrman  & the book "The END of DIABETES " by Dr Joel Fuhrman At [employer].com - get book & Audio CD's    Being diabetic has a  300% increased risk for heart attack, stroke, cancer, and alzheimer- type vascular dementia. It is very important that you work harder with diet by avoiding all foods that are white. Avoid white rice (brown & wild rice is OK), white potatoes (sweetpotatoes  in moderation is OK), White bread or wheat bread or anything made out of white flour like bagels, donuts, rolls, buns, biscuits, cakes, pastries, cookies, pizza crust, and pasta (made from white flour & egg whites) - vegetarian pasta or spinach or wheat pasta is OK. Multigrain breads like Arnold's or [employer], or multigrain sandwich thins or flatbreads.  Diet, exercise and weight loss can reverse and cure diabetes in the early stages.  Diet, exercise and weight loss is very important in the control and prevention of complications of diabetes which affects every system in your body, ie. Brain - dementia/stroke, eyes - glaucoma/blindness, heart - heart attack/heart failure, kidneys - dialysis, stomach - gastric paralysis, intestines - malabsorption, nerves - severe painful neuritis, circulation - gangrene & loss of a leg(s), and finally cancer and Alzheimers.    I recommend avoid fried & greasy foods,  sweets/candy, white rice (brown or wild rice or Quinoa is OK), white potatoes (sweet potatoes are OK) - anything made from white flour - bagels, doughnuts, rolls, buns, biscuits,white and wheat breads, pizza crust and traditional pasta made of white flour & egg white(vegetarian pasta or spinach or wheat pasta is OK).  Multi-grain bread is OK - like multi-grain flat bread or sandwich thins. Avoid alcohol in excess. Exercise is also important.    Eat all the vegetables you want -   avoid meat, especially red meat and dairy - especially cheese.  Cheese is the most concentrated form of trans-fats which is the worst thing to clog up our arteries. Veggie cheese is OK which can be found in the fresh produce section at [employer] or [employer] or [employer]  +++++++++++++++++++ DASH Eating Plan  DASH stands for "Dietary Approaches to Stop Hypertension."   The DASH eating plan is a healthy eating plan that has been shown to reduce high blood pressure (hypertension). Additional health benefits may include  reducing the risk of type 2 diabetes mellitus, heart disease, and stroke. The DASH eating plan may also help with weight loss. WHAT DO I NEED TO KNOW ABOUT THE DASH EATING PLAN? For the DASH eating plan, you will follow these general guidelines:  Choose foods with a percent daily value for sodium of less than 5% (as listed on the food label).  Use salt-free seasonings or herbs instead of table salt or sea salt.  Check with your health care provider or pharmacist before using salt substitutes.  Eat lower-sodium products, often labeled as "lower sodium" or "no salt added."  Eat fresh foods.  Eat more vegetables, fruits, and low-fat dairy products.  Choose whole grains. Look for the word "whole" as the first word in the ingredient list.  Choose fish   Limit sweets, desserts, sugars, and sugary drinks.  Choose heart-healthy fats.  Eat veggie cheese   Eat more home-cooked food and less restaurant, buffet, and fast food.  Limit fried foods.  Cook foods using methods other than frying.  Limit canned vegetables. If you do use them, rinse them well to decrease the sodium.  When eating at a restaurant, ask that your food be prepared with less salt, or no salt if possible.                      WHAT FOODS CAN I EAT? Read Dr Fara Olden Fuhrman's books on The End of Dieting & The End of Diabetes  Grains Whole grain or whole wheat bread. Brown rice. Whole grain or whole wheat pasta. Quinoa, bulgur, and whole grain cereals. Low-sodium cereals. Corn or whole wheat flour tortillas. Whole grain cornbread. Whole grain crackers. Low-sodium crackers.  Vegetables Fresh or frozen vegetables (raw, steamed, roasted, or grilled). Low-sodium or reduced-sodium tomato and vegetable juices. Low-sodium or reduced-sodium tomato sauce and paste. Low-sodium or reduced-sodium canned vegetables.   Fruits All fresh, canned (in natural juice), or frozen fruits.  Protein Products  All fish and seafood.  Dried  beans, peas, or lentils. Unsalted nuts and seeds. Unsalted canned beans.  Dairy Low-fat dairy products, such as skim or 1% milk, 2% or reduced-fat cheeses, low-fat ricotta or cottage cheese, or plain low-fat yogurt. Low-sodium or reduced-sodium cheeses.  Fats and Oils Tub margarines without trans fats. Light or reduced-fat mayonnaise and salad dressings (reduced sodium). Avocado. Safflower, olive, or canola oils. Natural peanut or almond butter.  Other Unsalted popcorn and pretzels. The items listed above may not be a complete list of recommended foods or beverages. Contact your dietitian for more options.  +++++++++++++++  WHAT FOODS ARE NOT RECOMMENDED? Grains/ White flour or wheat flour White bread. White pasta. White rice. Refined cornbread. Bagels and croissants. Crackers that contain trans fat.  Vegetables  Creamed or fried vegetables. Vegetables in a . Regular canned vegetables. Regular canned tomato sauce and paste. Regular tomato and vegetable juices.  Fruits Dried fruits. Canned fruit in light or heavy syrup. Fruit juice.  Meat and Other Protein Products Meat in general - RED meat & White meat.  Fatty cuts of meat. Ribs, chicken wings, all processed meats as bacon, sausage, bologna, salami, fatback, hot dogs, bratwurst and packaged luncheon meats.  Dairy Whole or 2% milk, cream, half-and-half, and cream cheese. Whole-fat or sweetened yogurt. Full-fat cheeses or blue cheese. Non-dairy creamers and whipped toppings. Processed cheese, cheese spreads, or cheese curds.  Condiments Onion and garlic salt, seasoned salt, table salt, and sea salt. Canned and packaged gravies. Worcestershire sauce. Tartar sauce. Barbecue sauce. Teriyaki sauce. Soy sauce, including reduced sodium. Steak sauce. Fish sauce. Oyster sauce. Cocktail sauce. Horseradish. Ketchup and mustard. Meat flavorings and tenderizers. Bouillon cubes. Hot sauce. Tabasco sauce. Marinades. Taco seasonings.  Relishes.  Fats and Oils Butter, stick margarine, lard, shortening and bacon fat. Coconut, palm kernel, or palm oils. Regular salad dressings.  Pickles and olives. Salted popcorn and pretzels.  The items listed above may not be a complete list of foods and beverages to avoid.    

## 2019-02-07 ENCOUNTER — Ambulatory Visit (INDEPENDENT_AMBULATORY_CARE_PROVIDER_SITE_OTHER): Payer: Medicare HMO | Admitting: Internal Medicine

## 2019-02-07 ENCOUNTER — Other Ambulatory Visit: Payer: Self-pay | Admitting: Internal Medicine

## 2019-02-07 ENCOUNTER — Other Ambulatory Visit: Payer: Self-pay

## 2019-02-07 VITALS — BP 130/80 | HR 64 | Temp 97.2°F | Resp 16 | Ht 61.0 in | Wt 117.0 lb

## 2019-02-07 DIAGNOSIS — E538 Deficiency of other specified B group vitamins: Secondary | ICD-10-CM

## 2019-02-07 DIAGNOSIS — Z8249 Family history of ischemic heart disease and other diseases of the circulatory system: Secondary | ICD-10-CM

## 2019-02-07 DIAGNOSIS — Z136 Encounter for screening for cardiovascular disorders: Secondary | ICD-10-CM | POA: Diagnosis not present

## 2019-02-07 DIAGNOSIS — I700 Atherosclerosis of aorta: Secondary | ICD-10-CM

## 2019-02-07 DIAGNOSIS — F0150 Vascular dementia without behavioral disturbance: Secondary | ICD-10-CM

## 2019-02-07 DIAGNOSIS — E782 Mixed hyperlipidemia: Secondary | ICD-10-CM | POA: Diagnosis not present

## 2019-02-07 DIAGNOSIS — Z87891 Personal history of nicotine dependence: Secondary | ICD-10-CM | POA: Diagnosis not present

## 2019-02-07 DIAGNOSIS — E039 Hypothyroidism, unspecified: Secondary | ICD-10-CM

## 2019-02-07 DIAGNOSIS — Z79899 Other long term (current) drug therapy: Secondary | ICD-10-CM

## 2019-02-07 DIAGNOSIS — Z0001 Encounter for general adult medical examination with abnormal findings: Secondary | ICD-10-CM

## 2019-02-07 DIAGNOSIS — Z1211 Encounter for screening for malignant neoplasm of colon: Secondary | ICD-10-CM

## 2019-02-07 DIAGNOSIS — I10 Essential (primary) hypertension: Secondary | ICD-10-CM

## 2019-02-07 DIAGNOSIS — R7309 Other abnormal glucose: Secondary | ICD-10-CM

## 2019-02-07 DIAGNOSIS — Z0000 Encounter for general adult medical examination without abnormal findings: Secondary | ICD-10-CM | POA: Diagnosis not present

## 2019-02-07 DIAGNOSIS — E559 Vitamin D deficiency, unspecified: Secondary | ICD-10-CM | POA: Diagnosis not present

## 2019-02-07 DIAGNOSIS — Z1231 Encounter for screening mammogram for malignant neoplasm of breast: Secondary | ICD-10-CM

## 2019-02-07 MED ORDER — MELOXICAM 15 MG PO TABS: ORAL_TABLET | ORAL | 1 refills | Status: DC

## 2019-02-08 LAB — URINALYSIS, ROUTINE W REFLEX MICROSCOPIC
Bilirubin Urine: NEGATIVE
Glucose, UA: NEGATIVE
Hgb urine dipstick: NEGATIVE
Ketones, ur: NEGATIVE
Nitrite: NEGATIVE
Specific Gravity, Urine: 1.007 (ref 1.001–1.03)
pH: 8 (ref 5.0–8.0)

## 2019-02-08 LAB — CBC WITH DIFFERENTIAL/PLATELET
Absolute Monocytes: 587 cells/uL (ref 200–950)
Basophils Absolute: 27 cells/uL (ref 0–200)
Basophils Relative: 0.3 %
Eosinophils Absolute: 89 cells/uL (ref 15–500)
Eosinophils Relative: 1 %
HCT: 40.9 % (ref 35.0–45.0)
Hemoglobin: 13.7 g/dL (ref 11.7–15.5)
Lymphs Abs: 2652 cells/uL (ref 850–3900)
MCH: 30.6 pg (ref 27.0–33.0)
MCHC: 33.5 g/dL (ref 32.0–36.0)
MCV: 91.5 fL (ref 80.0–100.0)
MPV: 10.3 fL (ref 7.5–12.5)
Monocytes Relative: 6.6 %
Neutro Abs: 5545 cells/uL (ref 1500–7800)
Neutrophils Relative %: 62.3 %
Platelets: 173 10*3/uL (ref 140–400)
RBC: 4.47 10*6/uL (ref 3.80–5.10)
RDW: 13.2 % (ref 11.0–15.0)
Total Lymphocyte: 29.8 %
WBC: 8.9 10*3/uL (ref 3.8–10.8)

## 2019-02-08 LAB — LIPID PANEL
Cholesterol: 165 mg/dL (ref <200)
HDL: 85 mg/dL (ref >=50)
LDL Cholesterol (Calc): 64 mg/dL (calc)
Non-HDL Cholesterol (Calc): 80 mg/dL (calc) (ref <130)
Total CHOL/HDL Ratio: 1.9 (calc) (ref <5.0)
Triglycerides: 76 mg/dL (ref <150)

## 2019-02-08 LAB — COMPLETE METABOLIC PANEL WITH GFR
AG Ratio: 1.9 (calc) (ref 1.0–2.5)
ALT: 25 U/L (ref 6–29)
AST: 34 U/L (ref 10–35)
Albumin: 4.8 g/dL (ref 3.6–5.1)
Alkaline phosphatase (APISO): 46 U/L (ref 37–153)
BUN/Creatinine Ratio: 14 (calc) (ref 6–22)
BUN: 18 mg/dL (ref 7–25)
CO2: 29 mmol/L (ref 20–32)
Calcium: 10.1 mg/dL (ref 8.6–10.4)
Chloride: 97 mmol/L — ABNORMAL LOW (ref 98–110)
Creat: 1.3 mg/dL — ABNORMAL HIGH (ref 0.60–0.93)
GFR, Est African American: 46 mL/min/{1.73_m2} — ABNORMAL LOW (ref >=60)
GFR, Est Non African American: 40 mL/min/{1.73_m2} — ABNORMAL LOW (ref >=60)
Globulin: 2.5 g/dL (calc) (ref 1.9–3.7)
Glucose, Bld: 107 mg/dL — ABNORMAL HIGH (ref 65–99)
Potassium: 3.7 mmol/L (ref 3.5–5.3)
Sodium: 138 mmol/L (ref 135–146)
Total Bilirubin: 0.7 mg/dL (ref 0.2–1.2)
Total Protein: 7.3 g/dL (ref 6.1–8.1)

## 2019-02-08 LAB — INSULIN, RANDOM: Insulin: 16.1 u[IU]/mL

## 2019-02-08 LAB — HEMOGLOBIN A1C
Hgb A1c MFr Bld: 5.7 % of total Hgb — ABNORMAL HIGH (ref <5.7)
Mean Plasma Glucose: 117 (calc)
eAG (mmol/L): 6.5 (calc)

## 2019-02-08 LAB — MAGNESIUM: Magnesium: 1.9 mg/dL (ref 1.5–2.5)

## 2019-02-08 LAB — TSH: TSH: 3.14 mIU/L (ref 0.40–4.50)

## 2019-02-08 LAB — MICROALBUMIN / CREATININE URINE RATIO
Creatinine, Urine: 37 mg/dL (ref 20–275)
Microalb Creat Ratio: 184 mcg/mg creat — ABNORMAL HIGH (ref <30)
Microalb, Ur: 6.8 mg/dL

## 2019-02-08 LAB — VITAMIN D 25 HYDROXY (VIT D DEFICIENCY, FRACTURES): Vit D, 25-Hydroxy: 57 ng/mL (ref 30–100)

## 2019-02-14 ENCOUNTER — Ambulatory Visit: Payer: Medicare HMO | Admitting: Neurology

## 2019-03-21 ENCOUNTER — Ambulatory Visit: Payer: Medicare HMO | Admitting: Neurology

## 2019-03-21 ENCOUNTER — Other Ambulatory Visit: Payer: Self-pay

## 2019-03-21 ENCOUNTER — Encounter: Payer: Self-pay | Admitting: Neurology

## 2019-03-21 VITALS — BP 147/68 | HR 59 | Temp 97.6°F | Ht 61.0 in | Wt 120.0 lb

## 2019-03-21 DIAGNOSIS — G301 Alzheimer's disease with late onset: Secondary | ICD-10-CM

## 2019-03-21 DIAGNOSIS — F0281 Dementia in other diseases classified elsewhere with behavioral disturbance: Secondary | ICD-10-CM | POA: Diagnosis not present

## 2019-03-21 MED ORDER — MEMANTINE HCL 10 MG PO TABS: 10.0000 mg | ORAL_TABLET | Freq: Every day | ORAL | 1 refills | Status: DC

## 2019-03-21 NOTE — Patient Instructions (Addendum)
As discussed, we will continue with donepezil 10 mg once daily.  I would like to suggest starting you on a second memory medication, Namenda (generic name: Memantine), starting at 10 mg once daily with gradual buildup to 10 mg twice daily. Please note that side effects may include, but are not limited to: nausea, confusion, hallucination, personality changes. If you are having mild side effects, try to stick with the treatment as these initial side effects may go away after the first 10-14 days.     Please follow up with the nurse practitioner in 3 months, we will consider increasing your Namenda at the time.  If you would like to consider increasing the new medication sooner, for example after 1 month, we can consider going up.  Please call or email through [facility] for this.

## 2019-03-21 NOTE — Progress Notes (Signed)
Subjective:    Patient ID: Stacy Wallace is a 74 y.o. female.  HPI     Interim history:   Stacy Wallace is a 74 year old right-handed woman with an underlying medical history of hypertension, hypothyroidism, vitamin D deficiency, hyperlipidemia, reflux disease, vitamin B12 deficiency, and insomnia, who presents for follow-up consultation of her dementia. The patient is accompanied by her daughter again today. I last saw her on 06/15/2018 in a virtual visit, At which time we talked about her recent test results.  She was advised to continue with Aricept.    Today, 03/21/19: she reports Feeling stable.  She feels that the memory medication is working well for her.  However, daughter has noticed decline in memory function in particular more forgetfulness, tendency to repeat herself and also getting lost or disoriented more easier.  One example would be a grocery store tripped recently about 3 weeks ago and typically her daughter takes her, does her own shopping separately and lets the patient shop separately. That particular day the patient lost her grocery cart 3 times.  The patient does not drive a car.  She lives with her husband, daughter lives close by and family including patient's husband have noticed decline in memory function in the past several weeks.  She denies any mood related issues such as anxiety or depression.  She has not had any balance issues or falls thankfully.  She had a physical with her primary care, she had blood work on 02/07/2019 which I reviewed.  Appetite is good, tries to hydrate well, drinks about 3-4 bottles of water per day. Sleeps well.    The patient's allergies, current medications, family history, past medical history, past social history, past surgical history and problem list were reviewed and updated as appropriate.    Previously:      I saw her on 03/08/2018, at which time she was able to tolerate generic Aricept 1 pill in the evening.  She had lost some weight.   She was not always keeping a regular eating schedule.  We talked about the importance of healthy lifestyle, good hydration, enough rest and healthy nutrition and we also considered adding a second memory medication down the road.  Her neuropsychological evaluation in September 2019 was supportive of mild to moderate dementia, probably Alzheimer's disease.    I saw her on 11/2017, at which time we talked about her test results with neuropsychological evaluation in September 2019. She was able to come off of Xanax. Her MMSE was 20 out of 30 at the time. She was advised to not drive any longer. She had also received a letter from the [facility]. I suggested we start her on Aricept generic.   I first met her on 04/28/2017 at the request of her primary care physician, at which time the patient reported forgetfulness for years. Her daughter reported an approximately eight-year history of memory loss especially after her retirement at age 26. Her MMSE was 16 out of 30 at the time. I suggested further workup in the form of brain MRI and neuropsychological evaluation.   She had a brain MRI without contrast on 05/07/2017 and I reviewed the results: IMPRESSION: This MRI of the brain without contrast shows the following: 1.   Mild generalized cortical atrophy.  2.   Some scattered T2/FLAIR hyperintense foci in the hemispheres and left cerebellum.   This is nonspecific and could be due to chronic microvascular ischemic change, demyelination or sequela of migraine or inflammation. 3.   There  are no acute findings.   We called her daughter with her test results.   She had interim evaluation with neuropsychology, saw Dr. 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 Results of cognitive testing were abnormal and demonstrated significant decline in several  areas of cognitive function relative to estimated baseline. Additionally, there is evidence that her cognitive deficits are interfering with her ability to manage complex ADLs (driving, finances, appointments). As such, diagnostic criteria for a dementia syndrome are met.  The patient's cognitive profile is notable for prominent impairment in temporal orientation, memory encoding and consolidation, semantic fluency, auditory comprehension and some aspects of executive functioning. Her clinical features and cognitive profile are most in line with Alzheimer's disease. It is possible that long term Xanax use has exacerbated underlying cognitive dysfunction. I would characterize stage of dementia as mild to moderate at the present time. She denies depression or anxiety symptoms but there does seem to be at least some mild adjustment related depression likely secondary to lack of social interaction. She also appears to be experiencing some mild paranoid ideation (suspicious of her husband helping her manage finances), which appears to be a neuropsychiatric feature of her dementia.       Recommendations/Plan: Based on the findings of the present evaluation, the following recommendations are offered:   1. From a neuropsychological perspective, the patient appears to be an appropriate candidate for cholinesterase inhibitor therapy. She will follow up with Dr. Rexene Alberts about this. 2. I discussed with the patient and her family the risks of long-term alprazolam use and the risk of confusion and falls in older adults due to anticholinergic properties. I would recommend slowly tapering off the medication if possible. The patient has been taking this medication for decades and is likely physiologically dependent. Antidepressant therapy (eg SSRI) does not have the same risk of cognitive impairment and may be useful in assisting the patient coming off alprazolam. Of course, all decisions with respect to medications are  deferred to her physicians.  3. The patient's performance on a cognitive test highly correlated with driving ability was severely impaired. For the safety of the patient and other motorists, it is my recommendation that she discontinue driving at this time. I had a long discussion with the patient/family about this; the patient is adamant that she can and will continue to drive.  4. She should have assistance with all complex ADLs (finances, appointments, transportation, medications). 5. She is vulnerable to undue influence and financial exploitation, and all safeguards should be taken to reduce the risk of these occurring.  6. She would likely greatly benefit from increased social interaction, mental stimulation and physical exercise. These types of activity would help with brain health but also mood and quality of life. She could attend a day program or, if feasible, hire a private duty companion, I provided local resources for additional information. 7. The patient's family will benefit from caregiver support and education. I provided local resources and contact information.>>   Her daughter called earlier this month requesting that we reinforce that the patient no longer drive as she was observed by the daughter to not be completely safe to drive. I suggested we discussed this issue during the appointment and follow recommendations particularly made during the neuropsychology follow-up appointment.     04/28/2017: (She) reports memory loss in terms of forgetfulness. She is  not sure how long. She reports that it is probably normal to be forgetful at her age. Information was also provided by her daughter Claiborne Billings. She reports that approximately 8 year history of memory loss and her mom, particularly noticeable after she retired at age 42. She works for [employer]. In the past couple of years of memory loss has been much more pronounced. She has been repeating herself often, is more forgetful, has had  some trouble driving including getting lost. She lives in a two-story home. She does not typically exercise but reports that she goes up and down the stairs a lot at her house. Her husband is 32 years old and still works. I reviewed your office note from 03/16/2017. She has had recent blood work through your office on 03/23/2017 which I reviewed. B12 level was in the normal range, the vitamin D level was normal, A1c was in the normal range, cholesterol level good, creatinine level 1.4. She is married and lives with her husband. She quit smoking in 1984, drinks alcohol occasionally, drinks caffeine daily. She does not always drink enough water according to the daughter. She has one son and one daughter, both live locally. She has no obvious family history of dementia. She had 4 brothers, 2 passed away, the other 2 she does not have close contact with. She has 2 sisters, none with memory loss. She has lost weight slowly over the past few years, more stable in the past couple of years. She snores but daughter has never noted any pauses in her breathing.   Her Past Medical History Is Significant For: Past Medical History:  Diagnosis Date  . GERD (gastroesophageal reflux disease)   . Hyperlipidemia   . Hypertension   . Other abnormal glucose   . Unspecified hypothyroidism   . Unspecified vitamin D deficiency     Her Past Surgical History Is Significant For: Past Surgical History:  Procedure Laterality Date  . ABDOMINAL HYSTERECTOMY    . CARPAL TUNNEL RELEASE Bilateral 2001  . EYE SURGERY    . NASAL SEPTUM SURGERY    . TUBAL LIGATION      Her Family History Is Significant For: Family History  Problem Relation Age of Onset  . Hypertension Mother   . Hyperlipidemia Mother   . Heart disease Father   . Alcohol abuse Father   . Hyperlipidemia Father   . Hypertension Father   . Heart disease Sister   . Stroke Sister   . Depression Sister   . Heart disease Brother     Her Social History Is  Significant For: Social History   Socioeconomic History  . Marital status: Married    Spouse name: Not on file  . Number of children: Not on file  . Years of education: Not on file  . Highest education level: Not on file  Occupational History  . Not on file  Tobacco Use  . Smoking status: Former Smoker    Quit date: 03/20/1988    Years since quitting: 31.0  . Smokeless tobacco: Never Used  Substance and Sexual Activity  . Alcohol use: Yes    Comment: rare glass of wine  . Drug use: Not on file  . Sexual activity: Not on file  Other Topics Concern  . Not on file  Social History Narrative  . Not on file   Social Determinants of Health   Financial Resource Strain:   . Difficulty of Paying Living Expenses:   Food Insecurity:   .  Worried About Charity fundraiser in the Last Year:   . Arboriculturist in the Last Year:   Transportation Needs:   . Film/video editor (Medical):   Marland Kitchen Lack of Transportation (Non-Medical):   Physical Activity:   . Days of Exercise per Week:   . Minutes of Exercise per Session:   Stress:   . Feeling of Stress :   Social Connections:   . Frequency of Communication with Friends and Family:   . Frequency of Social Gatherings with Friends and Family:   . Attends Religious Services:   . Active Member of Clubs or Organizations:   . Attends Archivist Meetings:   Marland Kitchen Marital Status:     Her Allergies Are:  Allergies  Allergen Reactions  . Lopid [Gemfibrozil]     headache  . Morphine And Related Other (See Comments)    Reaction: unknown   . Nickel Other (See Comments)    Reaction: unknown   . Xanax [Alprazolam]   . Latex Rash  :   Her Current Medications Are:  Outpatient Encounter Medications as of 03/21/2019  Medication Sig  . atenolol (TENORMIN) 100 MG tablet Take 1 tablet Daily for BP  . benazepril (LOTENSIN) 20 MG tablet Take 1 tablet (58m) at night for BP  . donepezil (ARICEPT) 10 MG tablet 1 tablet every evening.  .Marland Kitchen levothyroxine (SYNTHROID) 50 MCG tablet TAKE 1 TABLET EVERY DAY BEFORE BREAKFAST  . meloxicam (MOBIC) 15 MG tablet Take 1/2 to 1 tablet Daily with food for Pain & Inflammation & limit to 4 to 5 days /week to Avoid Kidney Damage  . olmesartan (BENICAR) 40 MG tablet Take 1 tablet Daily for BP  . rosuvastatin (CRESTOR) 40 MG tablet Take 1 tablet Daily for Cholesterol  . zinc gluconate 50 MG tablet Take 50 mg by mouth daily.  . [DISCONTINUED] Ascorbic Acid (VITAMIN C) 1000 MG tablet Take 1,000 mg by mouth daily.  . [DISCONTINUED] Magnesium 500 MG TABS Take 500 mg by mouth daily. Taking a Magnesium/Potassium tablet.  . [DISCONTINUED] omeprazole (PRILOSEC) 20 MG capsule Take 1 capsule Daily for Acid Indigestion & Reflux   No facility-administered encounter medications on file as of 03/21/2019.  :  Review of Systems:  Out of a complete 14 point review of systems, all are reviewed and negative with the exception of these symptoms as listed below:  Review of Systems  Neurological:       Pt states things are stable. Pt daughter feels there has been decline and would like to discuss medication change or increase.     Objective:  Neurological Exam  Physical Exam Physical Examination:   Vitals:   03/21/19 1305  BP: (!) 147/68  Pulse: (!) 59  Temp: 97.6 F (36.4 C)    General Examination: The patient is a very pleasant 74y.o. female in no acute distress. She appears well-developed and well-nourished and well groomed.   HEENT:Normocephalic, atraumatic, pupils are equal, round and reactive to light, extraocular tracking is good without limitation to gaze excursion or nystagmus noted. Normal smooth pursuit is noted. Hearing is grossly intact. Face is symmetric with normal facial animation and normal facial sensation. Speech is clear with no dysarthria noted. There is no hypophonia. There is no lip, neck/head, jaw or voice tremor. Neckshows FROM. Oropharynx exam reveals:moderatemouth dryness,  adequatedental hygiene.Tongue protrudes centrally and palate elevates symmetrically.  Chest:Clear to auscultation without wheezing, rhonchi or crackles noted.  Heart:S1+S2+0, regular and normal without murmurs,  rubs or gallops noted.   Abdomen:Soft, non-tender and non-distended with normal bowel sounds appreciated on auscultation.  Extremities:There isnopitting edema in the distal lower extremities bilaterally.   Skin: Warm and dry without trophic changes noted.  Musculoskeletal: exam reveals no obvious joint deformities, tenderness or joint swelling or erythema.   Neurologically:  Mental status: The patient is awake, alert and partially oriented. Her immediate and remote memory, attention, language skills and fund of knowledge areimpaired.There is no evidence of aphasia, agnosia, apraxia or anomia. Speech is clear with normal prosody and enunciation. Thought process is linear. Mood isnormaland affect is normal.  On4/24/2019: MMSE: 16/30, CDT: 2/4, AFT: 5/min.  On11/20/2019: MMSE: 20/30, CDT: 3/4, AFT: 6/min.  On 03/21/2019: MMSE: 26/30, CDT: 3/4, AFT: 5/min.  Cranial nerves II - XII are as described above under HEENT exam.  Motor exam: thin bulk, normal strength and tone is noted. There is no tremor. Fine motor skills and coordination: intactgrossly. Cerebellar testing: No dysmetria or intention tremor.  Sensory exam: intact to light touch.  Gait, station and balance:Shestands easily. No veering to one side is noted. No leaning to one side is noted. Posture is age-appropriate and stance is narrow based. Gait showsnormalstride length and normalpace. No problems turning are noted.   Assessmentand Plan:   In summary,Stacy F Smithis a very pleasant 14 year oldfemalewith an underlying medical history of hypertension, hypothyroidism, vitamin D deficiency, hyperlipidemia, reflux disease, vitamin B12 deficiency, and insomnia, whopresents  forFUconsultation of her dementia, likely Alzheimer's type.  She has been on donepezil since November 2019.  She had a brain MRI in May 2019 which showed mild generalized atrophy and microvascular changes.  Neuropsychological evaluation with cognitive testing in September 2019 with follow-up in October 2019 were supportive of a diagnosis of Alzheimer's disease.  Her family has noted a decline in her memory function.  Memory scores have fluctuated.  She denies any mood related changes and family has not noticed any behavioral changes. We again talked about the importance of healthy lifestyle and good nutrition and the importance of physical activity. She has been able to tolerate the generic Aricept. She is encouraged to continue with it.  Today I suggested we add Namenda as a secondary drug, generic, 10 mg strength 1 pill once daily for now.  If she is able to tolerate this we can increase this at the next visit, about 3 months from now or in the interim about a month from now.  Her daughter is encouraged to call or email through [place] for this.  She is advised to follow-up in 3 months to see the nurse practitioner.  We will increase the Namenda to 10 mg twice daily at the time or sooner if well-tolerated.  The patient no longer drives. I answered all their questions today and the patient and her daughter were in agreement. I spent 30 minutes in total face-to-face time and in reviewing records during pre-charting, more than 50% of which was spent in counseling and coordination of care, reviewing test results, reviewing medications and treatment regimen and/or in discussing or reviewing the diagnosis of dementia, the prognosis and treatment options. Pertinent laboratory and imaging test results that were available during this visit with the patient were reviewed by me and considered in my medical decision making (see chart for details).

## 2019-05-02 ENCOUNTER — Encounter: Payer: Self-pay | Admitting: Internal Medicine

## 2019-05-09 DIAGNOSIS — N1832 Chronic kidney disease, stage 3b: Secondary | ICD-10-CM | POA: Insufficient documentation

## 2019-05-09 NOTE — Progress Notes (Deleted)
Assessment and Plan:   Essential hypertension - continue medications, DASH diet, exercise and monitor at home. Call if greater than 130/80.  -     CBC with Differential/Platelet -     CMP/GFR -     TSH  Aortic atherosclerosis Per Xray 2018 Control blood pressure, cholesterol, glucose, increase exercise.   Alzheimer disease Continue follow up with neuro Exercise at least 10 mins a day Avoid sedating medications  Hypothyroidism, unspecified type -check TSH level, continue medications the same, reminded to take on an empty stomach 30-37mins before food.  -     TSH  Mixed hyperlipidemia -continue medications, check lipids, decrease fatty foods, increase activity.  -     Lipid panel  Prediabetes Discussed disease progression and risks Discussed diet/exercise, weight management and risk modification Check A1C q15m; monitor weight, serum glucose  Medication management -     Magnesium  Vitamin D deficiency At goal at recent check; continue to recommend supplementation for goal of 60-100 Defer vitamin D level  CKD 3b/A2 Increase fluids, avoid NSAIDS, monitor sugars, will monitor Discussed meloxicam use at length ***  Chronic pain  ***  Continue diet and meds as discussed. Further disposition pending results of labs. Future Appointments  Date Time Provider [place]  05/10/2019  2:30 PM Liane Comber, NP [facility] None  06/26/2019  1:00 PM Debbora Presto, NP [facility] None  08/10/2019  2:30 PM Unk Pinto, MD [facility] None  02/21/2020  2:00 PM Unk Pinto, MD [facility] None    HPI 74 y.o. female  presents for 3 month follow up with hypertension, hyperlipidemia, prediabetes and vitamin D.  Following with Dr. Mina Marble for neck and back pain. ***  Late onset Alzheimer's followed by Dr. Rexene Alberts, on aricept and namenda.    BMI is There is no height or weight on file to calculate BMI., she {HAS HAS [id number] been working on diet and exercise. Wt Readings from Last  3 Encounters:  03/21/19 120 lb (54.4 kg)  02/07/19 117 lb (53.1 kg)  08/31/18 113 lb 3.2 oz (51.3 kg)   She has aortic atherosclerosis per xray in 2018.   Her blood pressure has been controlled at home with change in medications, today their BP is  .    She does workout. She denies chest pain, shortness of breath, dizziness.   She is on cholesterol medication (rosuvastatin 40 mg daily ***) and denies myalgias. Her cholesterol is at goal. The cholesterol last visit was:   Lab Results  Component Value Date   CHOL 165 02/07/2019   HDL 85 02/07/2019   LDLCALC 64 02/07/2019   TRIG 76 02/07/2019   CHOLHDL 1.9 02/07/2019    She has been working on diet and exercise for prediabetes, and denies foot ulcerations, hyperglycemia, hypoglycemia , increased appetite, nausea, paresthesia of the feet, polydipsia, polyuria, visual disturbances, vomiting and weight loss. Last A1C in the office was:  Lab Results  Component Value Date   HGBA1C 5.7 (H) 02/07/2019    She has CKD IIIb monitored at this office:  Lab Results  Component Value Date   GFRNONAA 40 (L) 02/07/2019   *** meloxicam  Patient is on Vitamin D supplement.  Lab Results  Component Value Date   VD25OH 57 02/07/2019         Current Medications:  Current Outpatient Medications on File Prior to Visit  Medication Sig Dispense Refill  . atenolol (TENORMIN) 100 MG tablet Take 1 tablet Daily for BP 90 tablet 1  . benazepril (  LOTENSIN) 20 MG tablet Take 1 tablet (20mg ) at night for BP 90 tablet 1  . donepezil (ARICEPT) 10 MG tablet 1 tablet every evening. 90 tablet 0  . levothyroxine (SYNTHROID) 50 MCG tablet TAKE 1 TABLET EVERY DAY BEFORE BREAKFAST 90 tablet 1  . meloxicam (MOBIC) 15 MG tablet Take 1/2 to 1 tablet Daily with food for Pain & Inflammation & limit to 4 to 5 days /week to Avoid Kidney Damage 90 tablet 1  . memantine (NAMENDA) 10 MG tablet Take 1 tablet (10 mg total) by mouth daily. 90 tablet 1  . olmesartan (BENICAR) 40  MG tablet Take 1 tablet Daily for BP 90 tablet 1  . rosuvastatin (CRESTOR) 40 MG tablet Take 1 tablet Daily for Cholesterol 90 tablet 1  . zinc gluconate 50 MG tablet Take 50 mg by mouth daily.     No current facility-administered medications on file prior to visit.    Medical History:  Past Medical History:  Diagnosis Date  . GERD (gastroesophageal reflux disease)   . Hyperlipidemia   . Hypertension   . Other abnormal glucose   . Unspecified hypothyroidism   . Unspecified vitamin D deficiency     Allergies:  Allergies  Allergen Reactions  . Lopid [Gemfibrozil]     headache  . Morphine And Related Other (See Comments)    Reaction: unknown   . Nickel Other (See Comments)    Reaction: unknown   . Xanax [Alprazolam]   . Latex Rash     Review of Systems:  Review of Systems  Constitutional: Negative for chills, fever and malaise/fatigue.  HENT: Negative for congestion, ear pain and sore throat.   Eyes: Negative.   Respiratory: Negative for cough, shortness of breath and wheezing.   Cardiovascular: Negative for chest pain, palpitations and leg swelling.  Gastrointestinal: Negative for abdominal pain, blood in stool, constipation, diarrhea, heartburn and melena.  Genitourinary: Negative.   Musculoskeletal: Positive for back pain, joint pain and neck pain.  Skin: Negative.   Neurological: Negative for dizziness, sensory change, loss of consciousness and headaches.  Psychiatric/Behavioral: Positive for memory loss (ongoing, follows with neuro). Negative for depression. The patient is not nervous/anxious and does not have insomnia.     Family history- Review and unchanged  Social history- Review and unchanged  Physical Exam: There were no vitals taken for this visit. Wt Readings from Last 3 Encounters:  03/21/19 120 lb (54.4 kg)  02/07/19 117 lb (53.1 kg)  08/31/18 113 lb 3.2 oz (51.3 kg)    General Appearance: Well nourished well developed, in no apparent  distress. Eyes: PERRLA, EOMs, conjunctiva no swelling or erythema ENT/Mouth: Ear canals normal without obstruction, swelling, erythma, discharge.  TMs normal bilaterally.  Oropharynx moist, clear, without exudate, or postoropharyngeal swelling. Slight depapillation of tongue, smooth, slightly erythematous but no obvious oral lesions Neck: Supple, thyroid normal,no cervical adenopathy  Respiratory: Respiratory effort normal, Breath sounds clear A&P without rhonchi, wheeze, or rale.  No retractions, no accessory usage. Cardio: RRR with prominent S2. Brisk peripheral pulses without edema.  Abdomen: Soft, + BS,  Non tender, no guarding, rebound, hernias, masses. Musculoskeletal: Full ROM, 5/5 strength, Normal gait Skin: Warm, dry without rashes, lesions, ecchymosis.  Neuro: Awake and oriented X 3, Cranial nerves intact. Normal muscle tone, no cerebellar symptoms. Psych: Normal affect, Insight and Judgment appropriate.    Izora Ribas, NP 8:54 AM [facility]

## 2019-05-10 ENCOUNTER — Ambulatory Visit: Payer: Medicare HMO | Admitting: Adult Health

## 2019-06-26 ENCOUNTER — Ambulatory Visit: Payer: Medicare HMO | Admitting: Family Medicine

## 2019-08-10 ENCOUNTER — Ambulatory Visit (INDEPENDENT_AMBULATORY_CARE_PROVIDER_SITE_OTHER): Payer: Medicare HMO | Admitting: Internal Medicine

## 2019-08-10 ENCOUNTER — Encounter: Payer: Self-pay | Admitting: Internal Medicine

## 2019-08-10 ENCOUNTER — Other Ambulatory Visit: Payer: Self-pay

## 2019-08-10 VITALS — BP 142/104 | HR 92 | Temp 97.0°F | Resp 16 | Ht 61.0 in | Wt 129.8 lb

## 2019-08-10 DIAGNOSIS — I10 Essential (primary) hypertension: Secondary | ICD-10-CM | POA: Diagnosis not present

## 2019-08-10 DIAGNOSIS — E782 Mixed hyperlipidemia: Secondary | ICD-10-CM

## 2019-08-10 DIAGNOSIS — Z79899 Other long term (current) drug therapy: Secondary | ICD-10-CM

## 2019-08-10 DIAGNOSIS — E039 Hypothyroidism, unspecified: Secondary | ICD-10-CM

## 2019-08-10 DIAGNOSIS — E559 Vitamin D deficiency, unspecified: Secondary | ICD-10-CM

## 2019-08-10 DIAGNOSIS — F0150 Vascular dementia without behavioral disturbance: Secondary | ICD-10-CM | POA: Diagnosis not present

## 2019-08-10 DIAGNOSIS — R7309 Other abnormal glucose: Secondary | ICD-10-CM | POA: Diagnosis not present

## 2019-08-10 NOTE — Progress Notes (Signed)
History of Present Illness:       This very nice 74 y.o.  MWF presents for 6 month follow up with HTN, HLD, Pre-Diabetes, Hypothyroid and Vitamin D Deficiency. Patient is also followed by Dr Rexene Alberts for  moderate vascular Dementia.       Patient is treated for HTN (1999) & BP has been controlled at home. Today's BP is not at goal at 142/104 and it's speculated that she's not taking meds as prescribed. Patient has had no complaints of any cardiac type chest pain, palpitations, dyspnea / orthopnea / PND, dizziness, claudication, or dependent edema.      Hyperlipidemia is controlled with diet & meds. Patient denies myalgias or other med SE's. Last Lipids were at goal:  Lab Results  Component Value Date   CHOL 165 02/07/2019   HDL 85 02/07/2019   LDLCALC 64 02/07/2019   TRIG 76 02/07/2019   CHOLHDL 1.9 02/07/2019    Also, the patient has history of PreDiabetes(A1c 5.8% / 2011) and has had no symptoms of reactive hypoglycemia, diabetic polys, paresthesias or visual blurring.  Last A1c was near goal:  Lab Results  Component Value Date   HGBA1C 5.7 (H) 02/07/2019           Patient has been on thyroid replacement since 2014.       Further, the patient also has history of Vitamin D Deficiency and supplements vitamin D without any suspected side-effects. Last vitamin D was slightly Low:  Lab Results  Component Value Date   VD25OH 57 02/07/2019    Current Outpatient Medications on File Prior to Visit  Medication Sig  . atenolol (TENORMIN) 100 MG tablet Take 1 tablet Daily for BP  . benazepril (LOTENSIN) 20 MG tablet Take 1 tablet (20mg ) at night for BP  . donepezil (ARICEPT) 10 MG tablet 1 tablet every evening.  Marland Kitchen levothyroxine (SYNTHROID) 50 MCG tablet TAKE 1 TABLET EVERY DAY BEFORE BREAKFAST  . meloxicam (MOBIC) 15 MG tablet Take 1/2 to 1 tablet Daily with food for Pain & Inflammation & limit to 4 to 5 days /week to Avoid Kidney Damage  . memantine (NAMENDA) 10 MG tablet Take  1 tablet (10 mg total) by mouth daily.  Marland Kitchen olmesartan (BENICAR) 40 MG tablet Take 1 tablet Daily for BP  . rosuvastatin (CRESTOR) 40 MG tablet Take 1 tablet Daily for Cholesterol  . zinc gluconate 50 MG tablet Take 50 mg by mouth daily.   No current facility-administered medications on file prior to visit.    Allergies  Allergen Reactions  . Lopid [Gemfibrozil]     headache  . Morphine And Related Other (See Comments)    Reaction: unknown   . Nickel Other (See Comments)    Reaction: unknown   . Xanax [Alprazolam]   . Latex Rash    PMHx:   Past Medical History:  Diagnosis Date  . GERD (gastroesophageal reflux disease)   . Hyperlipidemia   . Hypertension   . Other abnormal glucose   . Unspecified hypothyroidism   . Unspecified vitamin D deficiency     Immunization History  Administered Date(s) Administered  . Influenza, High Dose Seasonal PF 09/19/2013, 10/16/2014, 10/08/2015, 11/30/2016  . Influenza-Unspecified 09/15/2012, 11/05/2016, 11/05/2017  . Pneumococcal Conjugate-13 08/08/2015  . Pneumococcal-Unspecified 03/18/2010  . Td 11/09/2006    Past Surgical History:  Procedure Laterality Date  . ABDOMINAL HYSTERECTOMY    . CARPAL TUNNEL RELEASE Bilateral 2001  . EYE SURGERY    .  NASAL SEPTUM SURGERY    . TUBAL LIGATION      FHx:    Reviewed / unchanged  SHx:    Reviewed / unchanged   Systems Review:  Constitutional: Denies fever, chills, wt changes, headaches, insomnia, fatigue, night sweats, change in appetite. Eyes: Denies redness, blurred vision, diplopia, discharge, itchy, watery eyes.  ENT: Denies discharge, congestion, post nasal drip, epistaxis, sore throat, earache, hearing loss, dental pain, tinnitus, vertigo, sinus pain, snoring.  CV: Denies chest pain, palpitations, irregular heartbeat, syncope, dyspnea, diaphoresis, orthopnea, PND, claudication or edema. Respiratory: denies cough, dyspnea, DOE, pleurisy, hoarseness, laryngitis, wheezing.    Gastrointestinal: Denies dysphagia, odynophagia, heartburn, reflux, water brash, abdominal pain or cramps, nausea, vomiting, bloating, diarrhea, constipation, hematemesis, melena, hematochezia  or hemorrhoids. Genitourinary: Denies dysuria, frequency, urgency, nocturia, hesitancy, discharge, hematuria or flank pain. Musculoskeletal: Denies arthralgias, myalgias, stiffness, jt. swelling, pain, limping or strain/sprain.  Skin: Denies pruritus, rash, hives, warts, acne, eczema or change in skin lesion(s). Neuro: No weakness, tremor, incoordination, spasms, paresthesia or pain. Psychiatric: Denies confusion, memory loss or sensory loss. Endo: Denies change in weight, skin or hair change.  Heme/Lymph: No excessive bleeding, bruising or enlarged lymph nodes.  Physical Exam  BP (!) 142/104   Pulse 92   Temp (!) 97 F (36.1 C)   Resp 16   Ht 5\' 1"  (1.549 m)   Wt 129 lb 12.8 oz (58.9 kg)   BMI 24.53 kg/m   Appears  well nourished, well groomed  and in no distress.  Eyes: PERRLA, EOMs, conjunctiva no swelling or erythema. Sinuses: No frontal/maxillary tenderness ENT/Mouth: EAC's clear, TM's nl w/o erythema, bulging. Nares clear w/o erythema, swelling, exudates. Oropharynx clear without erythema or exudates. Oral hygiene is good. Tongue normal, non obstructing. Hearing intact.  Neck: Supple. Thyroid not palpable. Car 2+/2+ without bruits, nodes or JVD. Chest: Respirations nl with BS clear & equal w/o rales, rhonchi, wheezing or stridor.  Cor: Heart sounds normal w/ regular rate and rhythm without sig. murmurs, gallops, clicks or rubs. Peripheral pulses normal and equal  without edema.  Abdomen: Soft & bowel sounds normal. Non-tender w/o guarding, rebound, hernias, masses or organomegaly.  Lymphatics: Unremarkable.  Musculoskeletal: Full ROM all peripheral extremities, joint stability, 5/5 strength and normal gait.  Skin: Warm, dry without exposed rashes, lesions or ecchymosis apparent.   Neuro: Cranial nerves intact, reflexes equal bilaterally. Sensory-motor testing grossly intact. Tendon reflexes grossly intact.  Pysch: Alert & oriented x 3.  Insight and judgement nl & appropriate. No ideations.  Assessment and Plan:  1. Essential hypertension  - Continue medication, monitor blood pressure at home.  - Continue DASH diet.  Reminder to go to the ER if any CP,  SOB, nausea, dizziness, severe HA, changes vision/speech.  - CBC with Differential/Platelet - COMPLETE METABOLIC PANEL WITH GFR - Magnesium - TSH  2. Hyperlipidemia, mixed  - Continue diet/meds, exercise,& lifestyle modifications.  - Continue monitor periodic cholesterol/liver & renal functions   - Lipid panel - TSH  3. Abnormal glucose  - Continue diet, exercise  - Lifestyle modifications.  - Monitor appropriate labs.  - Hemoglobin A1c - Insulin, random  4. Vitamin D deficiency  - Continue supplementation.  - VITAMIN D 25 Hydroxy  5. Hypothyroidism  - TSH  6. Vascular dementia without behavioral disturbance ([place])   7. Medication management  - CBC with Differential/Platelet - COMPLETE METABOLIC PANEL WITH GFR - Magnesium - Lipid panel - TSH - Hemoglobin A1c - Insulin, random - VITAMIN D 25  Hydroxy       Discussed  regular exercise, BP monitoring, weight control to achieve/maintain BMI less than 25 and discussed med and SE's. Recommended labs to assess and monitor clinical status with further disposition pending results of labs.  I discussed the assessment and treatment plan with the patient. The patient was provided an opportunity to ask questions and all were answered. The patient agreed with the plan and demonstrated an understanding of the instructions.  I provided over 30 minutes of exam, counseling, chart review and  complex critical decision making.   Kirtland Bouchard, MD

## 2019-08-10 NOTE — Patient Instructions (Signed)

## 2019-08-11 LAB — CBC WITH DIFFERENTIAL/PLATELET
Absolute Monocytes: 480 cells/uL (ref 200–950)
Basophils Absolute: 24 cells/uL (ref 0–200)
Basophils Relative: 0.3 %
Eosinophils Absolute: 128 cells/uL (ref 15–500)
Eosinophils Relative: 1.6 %
HCT: 41.5 % (ref 35.0–45.0)
Hemoglobin: 13.6 g/dL (ref 11.7–15.5)
Lymphs Abs: 2272 cells/uL (ref 850–3900)
MCH: 28.5 pg (ref 27.0–33.0)
MCHC: 32.8 g/dL (ref 32.0–36.0)
MCV: 87 fL (ref 80.0–100.0)
MPV: 10.4 fL (ref 7.5–12.5)
Monocytes Relative: 6 %
Neutro Abs: 5096 cells/uL (ref 1500–7800)
Neutrophils Relative %: 63.7 %
Platelets: 161 10*3/uL (ref 140–400)
RBC: 4.77 10*6/uL (ref 3.80–5.10)
RDW: 13.8 % (ref 11.0–15.0)
Total Lymphocyte: 28.4 %
WBC: 8 10*3/uL (ref 3.8–10.8)

## 2019-08-11 LAB — COMPLETE METABOLIC PANEL WITH GFR
AG Ratio: 1.8 (calc) (ref 1.0–2.5)
ALT: 9 U/L (ref 6–29)
AST: 23 U/L (ref 10–35)
Albumin: 4.4 g/dL (ref 3.6–5.1)
Alkaline phosphatase (APISO): 64 U/L (ref 37–153)
BUN/Creatinine Ratio: 14 (calc) (ref 6–22)
BUN: 16 mg/dL (ref 7–25)
CO2: 28 mmol/L (ref 20–32)
Calcium: 9.8 mg/dL (ref 8.6–10.4)
Chloride: 104 mmol/L (ref 98–110)
Creat: 1.12 mg/dL — ABNORMAL HIGH (ref 0.60–0.93)
GFR, Est African American: 55 mL/min/{1.73_m2} — ABNORMAL LOW (ref >=60)
GFR, Est Non African American: 48 mL/min/{1.73_m2} — ABNORMAL LOW (ref >=60)
Globulin: 2.5 g/dL (calc) (ref 1.9–3.7)
Glucose, Bld: 96 mg/dL (ref 65–99)
Potassium: 4.2 mmol/L (ref 3.5–5.3)
Sodium: 140 mmol/L (ref 135–146)
Total Bilirubin: 0.5 mg/dL (ref 0.2–1.2)
Total Protein: 6.9 g/dL (ref 6.1–8.1)

## 2019-08-11 LAB — LIPID PANEL
Cholesterol: 217 mg/dL — ABNORMAL HIGH (ref <200)
HDL: 63 mg/dL (ref >=50)
LDL Cholesterol (Calc): 123 mg/dL (calc) — ABNORMAL HIGH
Non-HDL Cholesterol (Calc): 154 mg/dL (calc) — ABNORMAL HIGH (ref <130)
Total CHOL/HDL Ratio: 3.4 (calc) (ref <5.0)
Triglycerides: 194 mg/dL — ABNORMAL HIGH (ref <150)

## 2019-08-11 LAB — HEMOGLOBIN A1C
Hgb A1c MFr Bld: 5.7 % of total Hgb — ABNORMAL HIGH (ref <5.7)
Mean Plasma Glucose: 117 (calc)
eAG (mmol/L): 6.5 (calc)

## 2019-08-11 LAB — MAGNESIUM: Magnesium: 1.9 mg/dL (ref 1.5–2.5)

## 2019-08-11 LAB — INSULIN, RANDOM: Insulin: 12.5 u[IU]/mL

## 2019-08-11 LAB — VITAMIN D 25 HYDROXY (VIT D DEFICIENCY, FRACTURES): Vit D, 25-Hydroxy: 37 ng/mL (ref 30–100)

## 2019-08-11 LAB — TSH: TSH: 5.4 mIU/L — ABNORMAL HIGH (ref 0.40–4.50)

## 2019-08-11 NOTE — Progress Notes (Signed)
============================================================ ° °-    Kidney functions improved from Stage 3b to Stage 3a - Terrific ============================================================  -  Total Chol = 217 - Too high  (Ideal or Goal is less than 180 )   - and   - LDL Chol = 123, Also too high  (Ideal or Goal is less than 70 ! )   - Please be sure taking Rosuvastatin / Crestor & not missing doses ! ============================================================  -  A1c = 5.7% - slightly elevated blood sugars , So..........................   - Avoid Sweets, Candy & White Stuff   - Rice, Potatoes, Breads &  Pasta ============================================================  -  Vitamin D = 37 - Please take Vitamin D 5,000 unit capsule Daily - Very Important  ============================================================

## 2019-08-15 ENCOUNTER — Ambulatory Visit: Payer: Medicare HMO | Admitting: Adult Health

## 2019-11-13 ENCOUNTER — Ambulatory Visit: Payer: Medicare HMO | Admitting: Adult Health Nurse Practitioner

## 2020-01-01 ENCOUNTER — Encounter: Payer: Medicare HMO | Admitting: Internal Medicine

## 2020-02-21 ENCOUNTER — Encounter: Payer: Medicare HMO | Admitting: Internal Medicine

## 2020-02-29 ENCOUNTER — Other Ambulatory Visit: Payer: Self-pay

## 2020-02-29 ENCOUNTER — Ambulatory Visit (INDEPENDENT_AMBULATORY_CARE_PROVIDER_SITE_OTHER): Payer: Medicare HMO | Admitting: Adult Health

## 2020-02-29 ENCOUNTER — Encounter: Payer: Self-pay | Admitting: Adult Health

## 2020-02-29 VITALS — BP 168/110 | HR 100 | Temp 96.4°F | Wt 128.0 lb

## 2020-02-29 DIAGNOSIS — R7309 Other abnormal glucose: Secondary | ICD-10-CM | POA: Diagnosis not present

## 2020-02-29 DIAGNOSIS — M5136 Other intervertebral disc degeneration, lumbar region: Secondary | ICD-10-CM | POA: Diagnosis not present

## 2020-02-29 DIAGNOSIS — I10 Essential (primary) hypertension: Secondary | ICD-10-CM | POA: Diagnosis not present

## 2020-02-29 DIAGNOSIS — G309 Alzheimer's disease, unspecified: Secondary | ICD-10-CM

## 2020-02-29 DIAGNOSIS — Z0001 Encounter for general adult medical examination with abnormal findings: Secondary | ICD-10-CM | POA: Diagnosis not present

## 2020-02-29 DIAGNOSIS — E039 Hypothyroidism, unspecified: Secondary | ICD-10-CM | POA: Diagnosis not present

## 2020-02-29 DIAGNOSIS — Z6824 Body mass index (BMI) 24.0-24.9, adult: Secondary | ICD-10-CM

## 2020-02-29 DIAGNOSIS — I700 Atherosclerosis of aorta: Secondary | ICD-10-CM | POA: Diagnosis not present

## 2020-02-29 DIAGNOSIS — Z0000 Encounter for general adult medical examination without abnormal findings: Secondary | ICD-10-CM

## 2020-02-29 DIAGNOSIS — L989 Disorder of the skin and subcutaneous tissue, unspecified: Secondary | ICD-10-CM

## 2020-02-29 DIAGNOSIS — E782 Mixed hyperlipidemia: Secondary | ICD-10-CM

## 2020-02-29 DIAGNOSIS — N1832 Chronic kidney disease, stage 3b: Secondary | ICD-10-CM

## 2020-02-29 DIAGNOSIS — Z79899 Other long term (current) drug therapy: Secondary | ICD-10-CM

## 2020-02-29 DIAGNOSIS — K219 Gastro-esophageal reflux disease without esophagitis: Secondary | ICD-10-CM

## 2020-02-29 DIAGNOSIS — D692 Other nonthrombocytopenic purpura: Secondary | ICD-10-CM | POA: Diagnosis not present

## 2020-02-29 DIAGNOSIS — G4700 Insomnia, unspecified: Secondary | ICD-10-CM

## 2020-02-29 DIAGNOSIS — R6889 Other general symptoms and signs: Secondary | ICD-10-CM

## 2020-02-29 DIAGNOSIS — Z9189 Other specified personal risk factors, not elsewhere classified: Secondary | ICD-10-CM

## 2020-02-29 DIAGNOSIS — F0280 Dementia in other diseases classified elsewhere without behavioral disturbance: Secondary | ICD-10-CM

## 2020-02-29 DIAGNOSIS — E559 Vitamin D deficiency, unspecified: Secondary | ICD-10-CM

## 2020-02-29 MED ORDER — ROSUVASTATIN CALCIUM 40 MG PO TABS: ORAL_TABLET | ORAL | 3 refills | Status: DC

## 2020-02-29 MED ORDER — LEVOTHYROXINE SODIUM 50 MCG PO TABS: ORAL_TABLET | ORAL | 3 refills | Status: DC

## 2020-02-29 MED ORDER — OLMESARTAN MEDOXOMIL 40 MG PO TABS: ORAL_TABLET | ORAL | 3 refills | Status: DC

## 2020-02-29 NOTE — Patient Instructions (Signed)
Stacy Wallace , Thank you for taking time to come for your Medicare Wellness Visit. I appreciate your ongoing commitment to your health goals. Please review the following plan we discussed and let me know if I can assist you in the future.   These are the goals we discussed: Goals    . Blood Pressure < 140/90    . DIET - INCREASE WATER INTAKE     Keep a log of water/tea intake every day - aim for 60+ fluid ounces daily    . Walk 30 min daily if not raining       This is a list of the screening recommended for you and due dates:  Health Maintenance  Topic Date Due  . COVID-19 Vaccine (1) Never done  . Flu Shot  Completed  . DEXA scan (bone density measurement)  Completed  . Pneumonia vaccines  Completed  . Tetanus Vaccine  Discontinued  .  Hepatitis C: One time screening is recommended by Center for Disease Control  (CDC) for  adults born from [birth year] through 1967.   Discontinued     Please keep a daily log of:  your blood pressure  whether you took your medications  how much water/tea you drank (fluid ounces)  If you took a walk - 30 min daily     Hip Bursitis Rehab Ask your health care provider which exercises are safe for you. Do exercises exactly as told by your health care provider and adjust them as directed. It is normal to feel mild stretching, pulling, tightness, or discomfort as you do these exercises. Stop right away if you feel sudden pain or your pain gets worse. Do not begin these exercises until told by your health care provider. Stretching exercise This exercise warms up your muscles and joints and improves the movement and flexibility of your hip. This exercise also helps to relieve pain and stiffness. Iliotibial band stretch An iliotibial band is a strong band of muscle tissue that runs from the outer side of your hip to the outer side of your thigh and knee. 1. Lie on your side with your left / right leg in the top position. 2. Bend your left / right knee and  grab your ankle. Stretch out your bottom arm to help you balance. 3. Slowly bring your knee back so your thigh is behind your body. 4. Slowly lower your knee toward the floor until you feel a gentle stretch on the outside of your left / right thigh. If you do not feel a stretch and your knee will not fall farther, place the heel of your other foot on top of your knee and pull your knee down toward the floor with your foot. 5. Hold this position for __________ seconds. 6. Slowly return to the starting position. Repeat __________ times. Complete this exercise __________ times a day.   Strengthening exercises These exercises build strength and endurance in your hip and pelvis. Endurance is the ability to use your muscles for a long time, even after they get tired. Bridge This exercise strengthens the muscles that move your thigh backward (hip extensors). 1. Lie on your back on a firm surface with your knees bent and your feet flat on the floor. 2. Tighten your buttocks muscles and lift your buttocks off the floor until your trunk is level with your thighs. ? Do not arch your back. ? You should feel the muscles working in your buttocks and the back of your thighs. If you  do not feel these muscles, slide your feet 1-2 inches (2.5-5 cm) farther away from your buttocks. ? If this exercise is too easy, try doing it with your arms crossed over your chest. 3. Hold this position for __________ seconds. 4. Slowly lower your hips to the starting position. 5. Let your muscles relax completely after each repetition. Repeat __________ times. Complete this exercise __________ times a day.   Squats This exercise strengthens the muscles in front of your thigh and knee (quadriceps). 1. Stand in front of a table, with your feet and knees pointing straight ahead. You may rest your hands on the table for balance but not for support. 2. Slowly bend your knees and lower your hips like you are going to sit in a  chair. ? Keep your weight over your heels, not over your toes. ? Keep your lower legs upright so they are parallel with the table legs. ? Do not let your hips go lower than your knees. ? Do not bend lower than told by your health care provider. ? If your hip pain increases, do not bend as low. 3. Hold the squat position for __________ seconds. 4. Slowly push with your legs to return to standing. Do not use your hands to pull yourself to standing. Repeat __________ times. Complete this exercise __________ times a day. Hip hike 1. Stand sideways on a bottom step. Stand on your left / right leg with your other foot unsupported next to the step. You can hold on to the railing or wall for balance if needed. 2. Keep your knees straight and your torso square. Then lift your left / right hip up toward the ceiling. 3. Hold this position for __________ seconds. 4. Slowly let your left / right hip lower toward the floor, past the starting position. Your foot should get closer to the floor. Do not lean or bend your knees. Repeat __________ times. Complete this exercise __________ times a day. Single leg stand 1. Without shoes, stand near a railing or in a doorway. You may hold on to the railing or door frame as needed for balance. 2. Squeeze your left / right buttock muscles, then lift up your other foot. ? Do not let your left / right hip push out to the side. ? It is helpful to stand in front of a mirror for this exercise so you can watch your hip. 3. Hold this position for __________ seconds. Repeat __________ times. Complete this exercise __________ times a day. This information is not intended to replace advice given to you by your health care provider. Make sure you discuss any questions you have with your health care provider. Document Revised: 04/18/2018 Document Reviewed: 04/18/2018 Elsevier Patient Education  [place].

## 2020-02-29 NOTE — Progress Notes (Signed)
Patient ID: Stacy Wallace, female   DOB: 05-10-1945, 75 y.o.   MRN: [id number]  MEDICARE ANNUAL WELLNESS VISIT AND FOLLOW UP  Assessment:   Encounter for Medicare annual wellness exam Due annually  Send covid 19 vaccine information  Aortic atherosclerosis ([place]) Control blood pressure, cholesterol, glucose, increase exercise.   Senile purpura ([place]) Reassured, protect skin  Essential hypertension - restart olmesartan 40 mg daily, if persists above goal restart atenolol with titration, daughter will call back; recheck NV in 4-6 weeks with labs - DASH diet, exercise and monitor at home. Call if greater than 140/90.  -     CBC with Differential/Platelet -     CMP/GFR -     TSH  Alzheimer disease Continue follow up with neuro Exercise at least 20-30 mins a day Has been off of namenda/aricept; declines refill to restart at this time;  Encouraged daily log to remember med compliance, fluid intake, daily walking Daughter will help monitor   Hypothyroidism, unspecified type -check TSH level, restart medicaion, reminded to take on an empty stomach 30-37mins before food.  -     TSH  Mixed hyperlipidemia -restart medications, check lipids, decrease fatty foods, increase activity.  -     Lipid panel  Prediabetes Discussed disease progression and risks Discussed diet/exercise, weight management and risk modification  - A1C   Medication management -     Magnesium  Vitamin D deficiency Defer check, restart supplement   Insomnia, unspecified type - good sleep hygiene discussed, increase day time activity  DDD (degenerative disc disease), lumbar Increase walking; tylenol PRN  Gastroesophageal reflux disease, esophagitis presence not specified Off of meds; denies sx  BMI 24 Monitor   CKD 3 (HCC) Increase fluids, avoid NSAIDS, monitor sugars, will monitor Keep log to track fluid intake - CMP/GFR  Skin lesion of left arm Undetermined how long has been there, unclear  etiology Daughter wonders if r/t watch; stop wearing for 1 month, if doesn't improve contact office for derm referral possible [facility]  Risk non-compliance with medications Sent in 1 year supply to mail order pharmacy Keep daily log of meds if taken Daughter will help monitor  Over 30 minutes of exam, counseling, chart review, and critical decision making was performed  Future Appointments  Date Time Provider [place]  03/11/2020  1:30 PM Ward Givens, NP [facility] None  03/03/2021  3:30 PM Liane Comber, NP [facility] None    Plan:   During the course of the visit the patient was educated and counseled about appropriate screening and preventive services including:    Pneumococcal vaccine   Influenza vaccine  Td vaccine  Prevnar 13  Screening electrocardiogram  Screening mammography  Bone densitometry screening  Colorectal cancer screening  Diabetes screening  Glaucoma screening  Nutrition counseling   Advanced directives: given info/requested copies   Subjective:   Stacy Wallace is a 75 y.o. female who presents for Medicare Annual Wellness Visit and 3 month follow up on hypertension, prediabetes, hyperlipidemia, vitamin D def.   She has Alzheimer's, followed by Dr. Rexene Alberts; was on namenda/aricept;  She lives with husband in apartment, family checks on her, no longer driving. No falls, no wandering or concern with safety. Daughter notes she doesn't drink enough fluids. Apparently she ran out of medications, typically mail order, has been off of all meds for several weeks/months, unsure, patient never contacted mail order pharmacy or office. She demonstrates very poor judgement. Daughter notes her memory has been much worse since off of  thyroid medication-   BMI is Body mass index is 24.19 kg/m., she has not been working on diet and exercise. Wt Readings from Last 3 Encounters:  02/29/20 128 lb (58.1 kg)  08/10/19 129 lb 12.8 oz (58.9 kg)  03/21/19 120 lb  (54.4 kg)   She has not been checking BPs at home, does have cuff, daughter can help, has been off of olmesartan 40 mg and atenolol 100 mg, today their BP is BP: (!) 168/110   She does not workout. She denies chest pain, shortness of breath, dizziness.   She has aortic atherosclerosis per xray 12/2016  She is on cholesterol medication and denies myalgias. Her cholesterol is at goal. The cholesterol last visit was:   Lab Results  Component Value Date   CHOL 217 (H) 08/10/2019   HDL 63 08/10/2019   LDLCALC 123 (H) 08/10/2019   TRIG 194 (H) 08/10/2019   CHOLHDL 3.4 08/10/2019   Patient reports that she has not been monitoring her diet.  She has a history of prediabetes which is diet controlled.  She has not symptoms of polydipsia, polyphagia, or polyuria.   Lab Results  Component Value Date   HGBA1C 5.7 (H) 08/10/2019   Last GFR Lab Results  Component Value Date   GFRNONAA 48 (L) 08/10/2019   She has CKD III monitored at this office.  Lab Results  Component Value Date   GFRNONAA 48 (L) 08/10/2019   GFRNONAA 40 (L) 02/07/2019   GFRNONAA 35 (L) 08/31/2018   Patient is on Vitamin D supplement. Lab Results  Component Value Date   VD25OH 37 08/10/2019     BMI is Body mass index is 24.19 kg/m., she is working on diet and exercise. Wt Readings from Last 3 Encounters:  02/29/20 128 lb (58.1 kg)  08/10/19 129 lb 12.8 oz (58.9 kg)  03/21/19 120 lb (54.4 kg)    Medication Review Current Outpatient Medications on File Prior to Visit  Medication Sig Dispense Refill  . zinc gluconate 50 MG tablet Take 50 mg by mouth daily. (Patient not taking: Reported on 02/29/2020)     No current facility-administered medications on file prior to visit.    Allergies: Allergies  Allergen Reactions  . Lopid [Gemfibrozil]     headache  . Morphine And Related Other (See Comments)    Reaction: unknown   . Nickel Other (See Comments)    Reaction: unknown   . Xanax [Alprazolam]   . Latex  Rash    Current Problems (verified) has Hyperlipidemia, mixed; Hypertension; Hypothyroidism; Abnormal glucose; Vitamin D deficiency; GERD (gastroesophageal reflux disease); Medication management; Insomnia; DDD (degenerative disc disease), lumbar; Aortic atherosclerosis ([place]); Alzheimer disease ([place]); CKD stage G3b/A2, GFR 30-44 and albumin creatinine ratio 30-299 mg/g (HCC); BMI 24.0-24.9, adult; Senile purpura ([place]); and Arm skin lesion, left on their problem list.  Screening Tests Immunization History  Administered Date(s) Administered  . Influenza, High Dose Seasonal PF 09/19/2013, 10/16/2014, 10/08/2015, 11/30/2016  . Influenza-Unspecified 09/15/2012, 11/05/2016, 11/05/2017, 10/06/2019  . Pneumococcal Conjugate-13 08/08/2015  . Pneumococcal-Unspecified 03/18/2010  . Td 11/09/2006    Preventative care: Last colonoscopy: 2008 normal, willing to do cologuard Cologuard: 03/2018 Last mammogram: 12/2017, would not pursue treatment DEXA: 2013  Left hip Xray 03/2013 MR spine 09/2015  Prior vaccinations: TD or Tdap: 2008 declines  Influenza: 10/2019 Pneumococcal: 2012 Prevnar13: 2017 Shingles/Zostavax: Declined   Names of Other Physician/Practitioners you currently use: 1. [facility]- here for primary care 2. Dr. Katy Fitch, eye doctor,  5+ years ago, readers at home 3. New dentist in [place] - Dr. Lannie Fields, dentist, last 2021  Patient Care Team: Unk Pinto, MD as PCP - General (Internal Medicine)  Surgical: She  has a past surgical history that includes Tubal ligation; Eye surgery; Abdominal hysterectomy; Carpal tunnel release (Bilateral, 2001); and Nasal septum surgery. Family Her family history includes Alcohol abuse in her father; Depression in her sister; Heart disease in her brother, father, and sister; Hyperlipidemia in her father and mother; Hypertension in her father and mother; Stroke in her sister. Social history  She reports  that she quit smoking about 31 years ago. She has never used smokeless tobacco. She reports current alcohol use. No history on file for drug use.  MEDICARE WELLNESS OBJECTIVES: Physical activity: Current Exercise Habits: The patient does not participate in regular exercise at present, Exercise limited by: None identified Cardiac risk factors: Cardiac Risk Factors include: advanced age (>61men, >63 women);dyslipidemia;hypertension;sedentary lifestyle;smoking/ tobacco exposure Depression/mood screen:   Depression screen [facility] 2/9 02/29/2020  Decreased Interest 0  Down, Depressed, Hopeless 1  PHQ - 2 Score 1    ADLs:  In your present state of health, do you have any difficulty performing the following activities: 02/29/2020  Hearing? N  Vision? N  Difficulty concentrating or making decisions? Y  Walking or climbing stairs? N  Dressing or bathing? N  Doing errands, shopping? Y  Comment family drives as needed  Conservation officer, nature and eating ? N  Using the Toilet? N  In the past six months, have you accidently leaked urine? N  Do you have problems with loss of bowel control? N  Managing your Medications? N  Managing your Finances? Y  Comment husband Engineer, mining? Y  Comment family assists  Some recent data might be hidden     Cognitive Testing  Alert? Yes  Normal Appearance?Yes  Oriented to person? Yes  Place? Yes   Time? No got month wrong  Recall of three objects?  1/3  Can perform simple calculations? No  Displays appropriate judgment? No  Can read the correct time from a watch face?Yes  EOL planning: Does Patient Have a Medical Advance Directive?: Yes Type of Advance Directive: [place] will [place] in Chart?: No - copy requested   Objective:   Today's Vitals   02/29/20 1505  BP: (!) 168/110  Pulse: 100  Temp: (!) 96.4 F (35.8 C)  SpO2: 96%  Weight: 128 lb (58.1 kg)   Body  mass index is 24.19 kg/m.  General appearance: alert, no distress, WD/WN,  female HEENT: normocephalic, sclerae anicteric, TMs pearly, nares patent, no discharge or erythema, pharynx normal Oral cavity: MMM, no lesions Neck: supple, no lymphadenopathy, no thyromegaly, no masses Heart: RRR, normal S1, S2, no murmurs Lungs: CTA bilaterally, no wheezes, rhonchi, or rales Abdomen: +bs, soft, non tender, non distended, no masses, no hepatomegaly, no splenomegaly Musculoskeletal: nontender, no swelling, no obvious deformity, no SI tenderness, neg straight leg.  Extremities: no edema, no cyanosis, no clubbing Pulses: 2+ symmetric, upper and lower extremities, normal cap refill Neurological: alert, oriented x 3, CN2-12 intact, strength normal upper extremities and lower extremities, sensation normal throughout, DTRs 2+ throughout, no cerebellar signs, gait normal. Scattered thought process, poor short term recall Psychiatric: normal affect, behavior normal, pleasant  Derm: warm/dry intact; scattered small ecchymoses to bil uppper extremities; left forearm with erythematous/scaly irregular area approx 2 cm x 2 cm  Medicare Attestation I have personally reviewed: The patient's medical and social history Their use of alcohol, tobacco or illicit drugs Their current medications and supplements The patient's functional ability including ADLs,fall risks, home safety risks, cognitive, and hearing and visual impairment Diet and physical activities Evidence for depression or mood disorders  The patient's weight, height, BMI, and visual acuity have been recorded in the chart.  I have made referrals, counseling, and provided education to the patient based on review of the above and I have provided the patient with a written personalized care plan for preventive services.     Izora Ribas, NP   02/29/2020

## 2020-03-01 LAB — HEMOGLOBIN A1C
Hgb A1c MFr Bld: 5.6 % of total Hgb (ref <5.7)
Mean Plasma Glucose: 114 mg/dL
eAG (mmol/L): 6.3 mmol/L

## 2020-03-01 LAB — COMPLETE METABOLIC PANEL WITH GFR
AG Ratio: 1.8 (calc) (ref 1.0–2.5)
ALT: 11 U/L (ref 6–29)
AST: 25 U/L (ref 10–35)
Albumin: 4.8 g/dL (ref 3.6–5.1)
Alkaline phosphatase (APISO): 75 U/L (ref 37–153)
BUN/Creatinine Ratio: 17 (calc) (ref 6–22)
BUN: 17 mg/dL (ref 7–25)
CO2: 31 mmol/L (ref 20–32)
Calcium: 10.7 mg/dL — ABNORMAL HIGH (ref 8.6–10.4)
Chloride: 106 mmol/L (ref 98–110)
Creat: 1.03 mg/dL — ABNORMAL HIGH (ref 0.60–0.93)
GFR, Est African American: 61 mL/min/{1.73_m2} (ref >=60)
GFR, Est Non African American: 53 mL/min/{1.73_m2} — ABNORMAL LOW (ref >=60)
Globulin: 2.7 g/dL (calc) (ref 1.9–3.7)
Glucose, Bld: 93 mg/dL (ref 65–99)
Potassium: 4 mmol/L (ref 3.5–5.3)
Sodium: 145 mmol/L (ref 135–146)
Total Bilirubin: 0.7 mg/dL (ref 0.2–1.2)
Total Protein: 7.5 g/dL (ref 6.1–8.1)

## 2020-03-01 LAB — CBC WITH DIFFERENTIAL/PLATELET
Absolute Monocytes: 483 cells/uL (ref 200–950)
Basophils Absolute: 28 cells/uL (ref 0–200)
Basophils Relative: 0.4 %
Eosinophils Absolute: 28 cells/uL (ref 15–500)
Eosinophils Relative: 0.4 %
HCT: 41.5 % (ref 35.0–45.0)
Hemoglobin: 13.8 g/dL (ref 11.7–15.5)
Lymphs Abs: 1829 cells/uL (ref 850–3900)
MCH: 29.7 pg (ref 27.0–33.0)
MCHC: 33.3 g/dL (ref 32.0–36.0)
MCV: 89.2 fL (ref 80.0–100.0)
MPV: 10.7 fL (ref 7.5–12.5)
Monocytes Relative: 7 %
Neutro Abs: 4533 cells/uL (ref 1500–7800)
Neutrophils Relative %: 65.7 %
Platelets: 202 10*3/uL (ref 140–400)
RBC: 4.65 10*6/uL (ref 3.80–5.10)
RDW: 13.9 % (ref 11.0–15.0)
Total Lymphocyte: 26.5 %
WBC: 6.9 10*3/uL (ref 3.8–10.8)

## 2020-03-01 LAB — LIPID PANEL
Cholesterol: 327 mg/dL — ABNORMAL HIGH (ref <200)
HDL: 69 mg/dL (ref >=50)
LDL Cholesterol (Calc): 229 mg/dL (calc) — ABNORMAL HIGH
Non-HDL Cholesterol (Calc): 258 mg/dL (calc) — ABNORMAL HIGH (ref <130)
Total CHOL/HDL Ratio: 4.7 (calc) (ref <5.0)
Triglycerides: 134 mg/dL (ref <150)

## 2020-03-01 LAB — TSH: TSH: 4.09 mIU/L (ref 0.40–4.50)

## 2020-03-01 LAB — MAGNESIUM: Magnesium: 2.2 mg/dL (ref 1.5–2.5)

## 2020-03-07 ENCOUNTER — Telehealth: Payer: Self-pay | Admitting: Neurology

## 2020-03-07 NOTE — Telephone Encounter (Signed)
I called the pt's daughter. She sts she recently found out the pt had stopped taking her BP, thyroid, memory meds, and cholesterol meds w/o telling anyone.   Pt has been reestablished on her maintenance meds and would like to reschedule may appt with NP with Dr. Rexene Alberts since it has been since March of 2021 since last appt. I have rescheduled for April 05/01/20. She reports the pt is not on her memory meds at this time, daughter reports meds did not offer much benefit.

## 2020-03-07 NOTE — Telephone Encounter (Signed)
Pt's daughter Rondia Higginbotham called wanting to speak to the RN to discuss changes in her mother. Please advise.

## 2020-03-11 ENCOUNTER — Ambulatory Visit: Payer: Medicare HMO | Admitting: Adult Health

## 2020-04-08 ENCOUNTER — Ambulatory Visit: Payer: Medicare HMO | Admitting: Adult Health

## 2020-05-01 ENCOUNTER — Ambulatory Visit: Payer: Self-pay | Admitting: Neurology

## 2020-05-01 ENCOUNTER — Other Ambulatory Visit: Payer: Self-pay | Admitting: Adult Health

## 2020-05-01 ENCOUNTER — Telehealth: Payer: Self-pay | Admitting: Neurology

## 2020-05-01 ENCOUNTER — Telehealth: Payer: Self-pay | Admitting: Adult Health

## 2020-05-01 DIAGNOSIS — F0280 Dementia in other diseases classified elsewhere without behavioral disturbance: Secondary | ICD-10-CM

## 2020-05-01 DIAGNOSIS — G309 Alzheimer's disease, unspecified: Secondary | ICD-10-CM

## 2020-05-01 NOTE — Telephone Encounter (Signed)
daughter, Rojean Ige called to request referral to [facility]- [facility] Dr. Lynnette Caffey. Patient lives in [place], this is more convenient.  States today, she & patient arrived late to scheduled appointment at [facility] and was unable to be seen due to arriving late to scheduled appointment.

## 2020-05-01 NOTE — Telephone Encounter (Signed)
Pt's daughter, Layaan Mott (on [place]) called, would like to discuss medication. Would like a call from the nurse.  Daughter would not elaborate or give name of medication. Ms. Claiborne Billings hung up on me when I ask her for the name of the medication. She said just send the message.

## 2020-05-01 NOTE — Telephone Encounter (Signed)
Pt has called Megan,RN back. Please call. 

## 2020-05-01 NOTE — Telephone Encounter (Signed)
I called pt. No answer, left a message asking pt to call me back.   

## 2020-05-01 NOTE — Telephone Encounter (Signed)
I called pt's Stacy Wallace back). We discussed concerns. In summary, Stacy Wallace was very upset that her mom was not seen today after arriving past her appointment time. She sts her dad got lost and that was the reason why they were late. I verbalized apologies for this, but did explain the no show policy and that Dr. Rexene Alberts had went to a meeting after it looked like the pt was not going to show.  Daughter was still upset at our no show policy and stated her mom would not be back to our practice. I tried to apologize again but daughter d/c the phone call.

## 2020-05-02 NOTE — Telephone Encounter (Signed)
Referral requested by patient's daughter Maudie Mercury,  was faxedl to [facility] [facility], Dr. Lynnette Caffey, per Liane Comber. Notified patient's daughter Maudie Mercury.

## 2020-05-06 ENCOUNTER — Ambulatory Visit: Payer: Medicare HMO | Admitting: Adult Health

## 2020-05-14 DIAGNOSIS — F0280 Dementia in other diseases classified elsewhere without behavioral disturbance: Secondary | ICD-10-CM | POA: Diagnosis not present

## 2020-05-14 DIAGNOSIS — G309 Alzheimer's disease, unspecified: Secondary | ICD-10-CM | POA: Diagnosis not present

## 2020-07-29 ENCOUNTER — Other Ambulatory Visit: Payer: Self-pay

## 2020-07-29 DIAGNOSIS — I10 Essential (primary) hypertension: Secondary | ICD-10-CM

## 2020-07-29 MED ORDER — ROSUVASTATIN CALCIUM 40 MG PO TABS: ORAL_TABLET | ORAL | 3 refills | Status: DC

## 2020-07-29 MED ORDER — LEVOTHYROXINE SODIUM 50 MCG PO TABS: ORAL_TABLET | ORAL | 3 refills | Status: DC

## 2020-07-29 MED ORDER — OLMESARTAN MEDOXOMIL 40 MG PO TABS: ORAL_TABLET | ORAL | 3 refills | Status: DC

## 2020-11-11 NOTE — Progress Notes (Signed)
Patient ID: Stacy Wallace, female   DOB: [birth year], 75 y.o.   MRN: [id number]  COMPLETE PHYSICAL EXAM  Assessment:   Encounter for General adult medical examination with abnormal findings Due annually   Aortic atherosclerosis ([place]) Control blood pressure, cholesterol, glucose, increase exercise.  EKG  Senile purpura (HCC) Reassured, protect skin Avoid picking/scratching at areas  Essential hypertension - Continue olmesartan 40 mg daily - DASH diet, exercise and monitor at home. Call if greater than 140/90.  -     CBC with Differential/Platelet -     CMP/GFR -     TSH - EKG  Alzheimer disease Continue follow up with neuro Exercise at least 20-30 mins a day Encouraged daily log to remember med compliance, fluid intake, daily walking Daughter will help monitor   Hypothyroidism, unspecified type -check TSH level, restart medicaion, reminded to take on an empty stomach 30-12mins before food.  -     TSH  Mixed hyperlipidemia -restart medications, check lipids, decrease fatty foods, increase activity.  -     Lipid panel CMP CBC  Prediabetes Discussed disease progression and risks Discussed diet/exercise, weight management and risk modification  - A1C   Medication management -     Magnesium  Vitamin D deficiency Vit D, would like to see in therapeutic range of 60-100  Insomnia, unspecified type - good sleep hygiene discussed, increase day time activity  DDD (degenerative disc disease), lumbar Increase walking; tylenol PRN  Gastroesophageal reflux disease, esophagitis presence not specified Off of meds; denies sx Magnesium  Skin lesion of left arm Undetermined how long has been there, unclear etiology Area has unchanged since noticed 02/29/20 Advised if it gets larger or does not resolve would like to refer to skin surgery center  CKD 3a ([place]) Increase fluids, avoid NSAIDS, monitor sugars, will monitor Keep log to track fluid intake - CMP/GFR Routine urine  with reflex microscopic Microalbumin/creatinine urine ration  Risk non-compliance with medications Sent in 1 year supply to mail order pharmacy Keep daily log of meds if taken Husband is present reminded to call if does not receive medicine by Monday  Over 40 minutes of exam, counseling, chart review, and critical decision making was performed  Future Appointments  Date Time Provider [place]  03/03/2021  3:30 PM Magda Bernheim, NP [facility] None  11/13/2021  2:00 PM Magda Bernheim, NP [facility] None      Subjective:   Stacy Wallace is a 75 y.o. female who presents for Medicare Annual Wellness Visit and 3 month follow up on hypertension, prediabetes, hyperlipidemia, vitamin D def.   She has Alzheimer's, no longer on medication or following with Dr. Rexene Alberts. She lives with husband in apartment, family checks on her, no longer driving. No falls, no wandering or concern with safety. Daughter notes she doesn't drink enough fluids. Apparently she ran out of medications, typically mail order, has been off of all meds for several weeks, unsure, patient never contacted mail order pharmacy or office. She demonstrates very poor judgement. Husband is present with pt and states they would not refill medications from mail order pharmacy but when 1 checked there was 3 refills.  BMI is Body mass index is 22.52 kg/m., she has been walking in her apartment complex Wt Readings from Last 3 Encounters:  11/13/20 119 lb 3.2 oz (54.1 kg)  02/29/20 128 lb (58.1 kg)  08/10/19 129 lb 12.8 oz (58.9 kg)   She has not been checking BPs at home, does have cuff,  daughter can help, has been off of olmesartan 40 mg , today their BP is BP: 140/90   She does not workout. She denies chest pain, shortness of breath, dizziness.   She has aortic atherosclerosis per xray 12/2016  She is on cholesterol medication and denies myalgias. Her cholesterol is at goal. The cholesterol last visit was:   Lab Results   Component Value Date   CHOL 327 (H) 02/29/2020   HDL 69 02/29/2020   LDLCALC 229 (H) 02/29/2020   TRIG 134 02/29/2020   CHOLHDL 4.7 02/29/2020   Patient reports that she has not been monitoring her diet.  She has a history of prediabetes which is diet controlled.  She has not symptoms of polydipsia, polyphagia, or polyuria.   Lab Results  Component Value Date   HGBA1C 5.6 02/29/2020   Last GFR Lab Results  Component Value Date   GFRNONAA 53 (L) 02/29/2020   She has CKD III monitored at this office.  Lab Results  Component Value Date   GFRNONAA 53 (L) 02/29/2020   GFRNONAA 48 (L) 08/10/2019   GFRNONAA 40 (L) 02/07/2019   Patient is on Vitamin D supplement. Lab Results  Component Value Date   VD25OH 37 08/10/2019     Medication Review Current Outpatient Medications on File Prior to Visit  Medication Sig Dispense Refill   zinc gluconate 50 MG tablet Take 50 mg by mouth daily.     No current facility-administered medications on file prior to visit.    Allergies: Allergies  Allergen Reactions   Lopid [Gemfibrozil]     headache   Morphine And Related Other (See Comments)    Reaction: unknown    Nickel Other (See Comments)    Reaction: unknown    Xanax [Alprazolam]    Latex Rash    Current Problems (verified) has Hyperlipidemia, mixed; Hypertension; Hypothyroidism; Abnormal glucose; Vitamin D deficiency; GERD (gastroesophageal reflux disease); Medication management; Insomnia; DDD (degenerative disc disease), lumbar; Aortic atherosclerosis ([place]); Alzheimer disease ([place]); CKD stage G3b/A2, GFR 30-44 and albumin creatinine ratio 30-299 mg/g (HCC); BMI 24.0-24.9, adult; Senile purpura ([place]); and Arm skin lesion, left on their problem list.  Screening Tests Immunization History  Administered Date(s) Administered   Influenza, High Dose Seasonal PF 09/19/2013, 10/16/2014, 10/08/2015, 11/30/2016   Influenza-Unspecified 09/15/2012, 11/05/2016, 11/05/2017, 10/06/2019    Pneumococcal Conjugate-13 08/08/2015   Pneumococcal-Unspecified 03/18/2010   Td 11/09/2006    Preventative care: Last colonoscopy: 2008 normal, willing to do cologuard Cologuard: 03/2018 Last mammogram: 12/2017, would not pursue treatment declines DEXA: 2013  Left hip Xray 03/2013 MR spine 09/2015  Prior vaccinations: TD or Tdap: 2008 declines  Influenza: 2022 Pneumococcal: 2012 Prevnar13: 2017 Shingles/Zostavax: Declined   Names of Other Physician/Practitioners you currently use: 1.  [facility]- here for [facility] 2. Dr. Katy Fitch, eye doctor, 5+ years ago, readers at home 3. New dentist in [place] - Dr. Lannie Fields, dentist, last 2021  Patient Care Team: Unk Pinto, MD as PCP - General (Internal Medicine)  Surgical: She  has a past surgical history that includes Tubal ligation; Eye surgery; Abdominal hysterectomy; Carpal tunnel release (Bilateral, 2001); and Nasal septum surgery. Family Her family history includes Alcohol abuse in her father; Depression in her sister; Heart disease in her brother, father, and sister; Hyperlipidemia in her father and mother; Hypertension in her father and mother; Stroke in her sister. Social history  She reports that she quit smoking about 32 years ago. Her smoking use included cigarettes. She has never  used smokeless tobacco. She reports current alcohol use. No history on file for drug use.  Review of Systems  Constitutional:  Negative for chills and fever.  HENT:  Negative for congestion, hearing loss, sinus pain, sore throat and tinnitus.   Eyes:  Negative for blurred vision and double vision.  Respiratory:  Negative for cough, hemoptysis, sputum production, shortness of breath and wheezing.   Cardiovascular:  Negative for chest pain, palpitations and leg swelling.  Gastrointestinal:  Negative for abdominal pain, constipation, diarrhea, heartburn, nausea and vomiting.  Genitourinary:  Negative  for dysuria and urgency.  Musculoskeletal:  Negative for back pain, falls, joint pain, myalgias and neck pain.  Skin:  Negative for rash.  Neurological:  Negative for dizziness, tingling, tremors, weakness and headaches.  Endo/Heme/Allergies:  Bruises/bleeds easily.  Psychiatric/Behavioral:  Positive for memory loss. Negative for depression and suicidal ideas. The patient is not nervous/anxious and does not have insomnia.     Objective:   Today's Vitals   11/13/20 1358  BP: 140/90  Pulse: 81  Resp: 16  Temp: 97.9 F (36.6 C)  SpO2: 97%  Weight: 119 lb 3.2 oz (54.1 kg)  Height: 5\' 1"  (1.549 m)    Body mass index is 22.52 kg/m.  General appearance: alert, no distress, WD/WN,  female HEENT: normocephalic, sclerae anicteric, TMs pearly, nares patent, no discharge or erythema, pharynx normal Oral cavity: MMM, no lesions Neck: supple, no lymphadenopathy, no thyromegaly, no masses Heart: RRR, normal S1, S2, no murmurs Lungs: CTA bilaterally, no wheezes, rhonchi, or rales Abdomen: +bs, soft, non tender, non distended, no masses, no hepatomegaly, no splenomegaly Musculoskeletal: nontender, no swelling, no obvious deformity, no SI tenderness, neg straight leg.  Extremities: no edema, no cyanosis, no clubbing Pulses: 2+ symmetric, upper and lower extremities, normal cap refill Neurological: alert, oriented x 3, CN2-12 intact, strength normal upper extremities and lower extremities, sensation normal throughout, DTRs 2+ throughout, no cerebellar signs, gait normal. Scattered thought process, poor short term recall Psychiatric: normal affect, behavior normal, pleasant , 0/3 word recall Derm: warm/dry intact; scattered small ecchymoses to bil uppper extremities; left forearm with erythematous/scaly irregular area approx 2 cm x 2 cm- unchanged from previous photo 02/29/20  EKG: NSR, no change from previous tracing    Magda Bernheim, NP   11/13/2020

## 2020-11-12 ENCOUNTER — Ambulatory Visit: Payer: Medicare HMO | Admitting: Nurse Practitioner

## 2020-11-12 ENCOUNTER — Ambulatory Visit: Payer: Medicare HMO | Admitting: Adult Health Nurse Practitioner

## 2020-11-13 ENCOUNTER — Encounter: Payer: Self-pay | Admitting: Nurse Practitioner

## 2020-11-13 ENCOUNTER — Other Ambulatory Visit: Payer: Self-pay

## 2020-11-13 ENCOUNTER — Ambulatory Visit (INDEPENDENT_AMBULATORY_CARE_PROVIDER_SITE_OTHER): Payer: Medicare HMO | Admitting: Nurse Practitioner

## 2020-11-13 VITALS — BP 140/90 | HR 81 | Temp 97.9°F | Resp 16 | Ht 61.0 in | Wt 119.2 lb

## 2020-11-13 DIAGNOSIS — N1831 Chronic kidney disease, stage 3a: Secondary | ICD-10-CM

## 2020-11-13 DIAGNOSIS — E039 Hypothyroidism, unspecified: Secondary | ICD-10-CM | POA: Diagnosis not present

## 2020-11-13 DIAGNOSIS — I10 Essential (primary) hypertension: Secondary | ICD-10-CM | POA: Diagnosis not present

## 2020-11-13 DIAGNOSIS — Z136 Encounter for screening for cardiovascular disorders: Secondary | ICD-10-CM | POA: Diagnosis not present

## 2020-11-13 DIAGNOSIS — E559 Vitamin D deficiency, unspecified: Secondary | ICD-10-CM

## 2020-11-13 DIAGNOSIS — R7309 Other abnormal glucose: Secondary | ICD-10-CM

## 2020-11-13 DIAGNOSIS — Z9189 Other specified personal risk factors, not elsewhere classified: Secondary | ICD-10-CM

## 2020-11-13 DIAGNOSIS — M51369 Other intervertebral disc degeneration, lumbar region without mention of lumbar back pain or lower extremity pain: Secondary | ICD-10-CM

## 2020-11-13 DIAGNOSIS — Z0001 Encounter for general adult medical examination with abnormal findings: Secondary | ICD-10-CM

## 2020-11-13 DIAGNOSIS — G4700 Insomnia, unspecified: Secondary | ICD-10-CM

## 2020-11-13 DIAGNOSIS — K219 Gastro-esophageal reflux disease without esophagitis: Secondary | ICD-10-CM | POA: Diagnosis not present

## 2020-11-13 DIAGNOSIS — F0280 Dementia in other diseases classified elsewhere without behavioral disturbance: Secondary | ICD-10-CM

## 2020-11-13 DIAGNOSIS — D692 Other nonthrombocytopenic purpura: Secondary | ICD-10-CM

## 2020-11-13 DIAGNOSIS — M5136 Other intervertebral disc degeneration, lumbar region: Secondary | ICD-10-CM

## 2020-11-13 DIAGNOSIS — Z0000 Encounter for general adult medical examination without abnormal findings: Secondary | ICD-10-CM | POA: Diagnosis not present

## 2020-11-13 DIAGNOSIS — G309 Alzheimer's disease, unspecified: Secondary | ICD-10-CM

## 2020-11-13 DIAGNOSIS — Z79899 Other long term (current) drug therapy: Secondary | ICD-10-CM

## 2020-11-13 DIAGNOSIS — E782 Mixed hyperlipidemia: Secondary | ICD-10-CM | POA: Diagnosis not present

## 2020-11-13 DIAGNOSIS — L989 Disorder of the skin and subcutaneous tissue, unspecified: Secondary | ICD-10-CM

## 2020-11-13 DIAGNOSIS — I700 Atherosclerosis of aorta: Secondary | ICD-10-CM

## 2020-11-13 MED ORDER — LEVOTHYROXINE SODIUM 50 MCG PO TABS: ORAL_TABLET | ORAL | 3 refills | Status: DC

## 2020-11-13 MED ORDER — OLMESARTAN MEDOXOMIL 40 MG PO TABS: ORAL_TABLET | ORAL | 3 refills | Status: DC

## 2020-11-13 MED ORDER — ROSUVASTATIN CALCIUM 40 MG PO TABS: ORAL_TABLET | ORAL | 3 refills | Status: DC

## 2020-11-13 NOTE — Patient Instructions (Signed)

## 2020-11-14 LAB — URINALYSIS, ROUTINE W REFLEX MICROSCOPIC
Bilirubin Urine: NEGATIVE
Glucose, UA: NEGATIVE
Hgb urine dipstick: NEGATIVE
Hyaline Cast: NONE SEEN /LPF
Ketones, ur: NEGATIVE
Leukocytes,Ua: NEGATIVE
Nitrite: NEGATIVE
Specific Gravity, Urine: 1.017 (ref 1.001–1.035)
pH: 6 (ref 5.0–8.0)

## 2020-11-14 LAB — LIPID PANEL
Cholesterol: 165 mg/dL (ref <200)
HDL: 69 mg/dL (ref >=50)
LDL Cholesterol (Calc): 76 mg/dL (calc)
Non-HDL Cholesterol (Calc): 96 mg/dL (calc) (ref <130)
Total CHOL/HDL Ratio: 2.4 (calc) (ref <5.0)
Triglycerides: 115 mg/dL (ref <150)

## 2020-11-14 LAB — COMPLETE METABOLIC PANEL WITH GFR
AG Ratio: 1.7 (calc) (ref 1.0–2.5)
ALT: 8 U/L (ref 6–29)
AST: 18 U/L (ref 10–35)
Albumin: 4.3 g/dL (ref 3.6–5.1)
Alkaline phosphatase (APISO): 81 U/L (ref 37–153)
BUN/Creatinine Ratio: 12 (calc) (ref 6–22)
BUN: 17 mg/dL (ref 7–25)
CO2: 27 mmol/L (ref 20–32)
Calcium: 9.9 mg/dL (ref 8.6–10.4)
Chloride: 106 mmol/L (ref 98–110)
Creat: 1.39 mg/dL — ABNORMAL HIGH (ref 0.60–1.00)
Globulin: 2.5 g/dL (calc) (ref 1.9–3.7)
Glucose, Bld: 94 mg/dL (ref 65–99)
Potassium: 3.7 mmol/L (ref 3.5–5.3)
Sodium: 144 mmol/L (ref 135–146)
Total Bilirubin: 0.6 mg/dL (ref 0.2–1.2)
Total Protein: 6.8 g/dL (ref 6.1–8.1)
eGFR: 39 mL/min/{1.73_m2} — ABNORMAL LOW (ref >=60)

## 2020-11-14 LAB — CBC WITH DIFFERENTIAL/PLATELET
Absolute Monocytes: 425 cells/uL (ref 200–950)
Basophils Absolute: 22 cells/uL (ref 0–200)
Basophils Relative: 0.3 %
Eosinophils Absolute: 72 cells/uL (ref 15–500)
Eosinophils Relative: 1 %
HCT: 40.5 % (ref 35.0–45.0)
Hemoglobin: 13.5 g/dL (ref 11.7–15.5)
Lymphs Abs: 1930 cells/uL (ref 850–3900)
MCH: 29.7 pg (ref 27.0–33.0)
MCHC: 33.3 g/dL (ref 32.0–36.0)
MCV: 89 fL (ref 80.0–100.0)
MPV: 10.4 fL (ref 7.5–12.5)
Monocytes Relative: 5.9 %
Neutro Abs: 4752 cells/uL (ref 1500–7800)
Neutrophils Relative %: 66 %
Platelets: 163 10*3/uL (ref 140–400)
RBC: 4.55 10*6/uL (ref 3.80–5.10)
RDW: 13.4 % (ref 11.0–15.0)
Total Lymphocyte: 26.8 %
WBC: 7.2 10*3/uL (ref 3.8–10.8)

## 2020-11-14 LAB — MICROALBUMIN / CREATININE URINE RATIO
Creatinine, Urine: 115 mg/dL (ref 20–275)
Microalb Creat Ratio: 151 mcg/mg creat — ABNORMAL HIGH (ref <30)
Microalb, Ur: 17.4 mg/dL

## 2020-11-14 LAB — HEMOGLOBIN A1C
Hgb A1c MFr Bld: 5.5 % of total Hgb (ref <5.7)
Mean Plasma Glucose: 111 mg/dL
eAG (mmol/L): 6.2 mmol/L

## 2020-11-14 LAB — TSH: TSH: 3.55 mIU/L (ref 0.40–4.50)

## 2020-11-14 LAB — MAGNESIUM: Magnesium: 2.4 mg/dL (ref 1.5–2.5)

## 2020-11-14 LAB — VITAMIN D 25 HYDROXY (VIT D DEFICIENCY, FRACTURES): Vit D, 25-Hydroxy: 50 ng/mL (ref 30–100)

## 2020-11-14 LAB — MICROSCOPIC MESSAGE

## 2020-11-21 NOTE — Progress Notes (Signed)
Patient is aware of lab results and instructions. -e welch

## 2021-02-12 DIAGNOSIS — D485 Neoplasm of uncertain behavior of skin: Secondary | ICD-10-CM | POA: Diagnosis not present

## 2021-02-12 DIAGNOSIS — L570 Actinic keratosis: Secondary | ICD-10-CM | POA: Diagnosis not present

## 2021-02-12 DIAGNOSIS — C44612 Basal cell carcinoma of skin of right upper limb, including shoulder: Secondary | ICD-10-CM | POA: Diagnosis not present

## 2021-02-12 DIAGNOSIS — L309 Dermatitis, unspecified: Secondary | ICD-10-CM | POA: Diagnosis not present

## 2021-02-12 DIAGNOSIS — C44619 Basal cell carcinoma of skin of left upper limb, including shoulder: Secondary | ICD-10-CM | POA: Diagnosis not present

## 2021-02-27 NOTE — Progress Notes (Signed)
Patient ID: Stacy Wallace, female   DOB: Mar 26, 1945, 76 y.o.   MRN: [id number]  MEDICARE ANNUAL WELLNESS VISIT AND FOLLOW UP  Assessment:   Encounter for Medicare annual wellness exam Due annually  Send covid 19 vaccine information  Aortic atherosclerosis ([place]) Control blood pressure, cholesterol, glucose, increase exercise.   Senile purpura ([place]) Reassured, protect skin  Essential hypertension - Restart Olmesartan - DASH diet, exercise and monitor at home. Call if greater than 140/90.  -     CBC with Differential/Platelet -     CMP/GFR -     TSH  Alzheimer disease Continue follow up with neuro Exercise at least 20-30 mins a day Has been off of namenda/aricept; declines refill to restart at this time;  Encouraged daily log to remember med compliance, fluid intake, daily walking Daughter will help monitor   Hypothyroidism, unspecified type -check TSH level, restart medicaion, reminded to take on an empty stomach 30-63mns before food.  -     TSH  Mixed hyperlipidemia -restart medications, check lipids, decrease fatty foods, increase activity.  -     Lipid panel  Prediabetes Discussed disease progression and risks Discussed diet/exercise, weight management and risk modification  - A1C   Medication management -     Magnesium  Vitamin D deficiency Defer check, restart supplement   Insomnia, unspecified type - good sleep hygiene discussed, increase day time activity  DDD (degenerative disc disease), lumbar Increase walking; tylenol PRN  Gastroesophageal reflux disease, esophagitis presence not specified Off of meds; denies sx  BMI 24 Monitor   CKD 3 (HCC) Increase fluids, avoid NSAIDS, monitor sugars, will monitor Keep log to track fluid intake - CMP/GFR   Risk non-compliance with medications Sent in 1 year supply to mail order pharmacy(reviewed again with husband that medications have refills, once current prescription is gone call pharmacy for  refill) Keep daily log of meds if taken Daughter will help monitor  Over 30 minutes of exam, counseling, chart review, and critical decision making was performed  Future Appointments  Date Time Provider [place] 11/13/2021  2:00 PM MMagda Bernheim NP [facility] None  03/03/2022  4:00 PM MMagda Bernheim NP [facility] None    Plan:   During the course of the visit the patient was educated and counseled about appropriate screening and preventive services including:   Pneumococcal vaccine  Influenza vaccine Td vaccine Prevnar 13 Screening electrocardiogram Screening mammography Bone densitometry screening Colorectal cancer screening Diabetes screening Glaucoma screening Nutrition counseling  Advanced directives: given info/requested copies   Subjective:   Stacy GIGLIAis a 76y.o. female who presents for Medicare Annual Wellness Visit and 3 month follow up on hypertension, prediabetes, hyperlipidemia, vitamin D def.   She has Alzheimer's, followed by Dr. ARexene Alberts was on namenda/aricept;  She lives with husband in apartment, family checks on her, no longer driving. No falls, no wandering or concern with safety. Husband present with patient today. Apparently she ran out of medications, typically mail order, has been off of all meds for 2 weeks patient never contacted mail order pharmacy or office. She demonstrates very poor judgement. Daughter notes her memory has been much worse since off of thyroid medication-  CTresckow BMI is Body mass index is 22.86 kg/m., she has not been working on diet and exercise. Wt Readings from Last 3 Encounters:  03/03/21 121 lb (54.9 kg)  11/13/20 119 lb 3.2 oz (54.1 kg)  02/29/20 128 lb (58.1 kg)  She has not been checking BPs at home, does have cuff, daughter can help, has been off of olmesartan 40 mg due to running out of medication, today their BP is BP: (!) 148/82  BP Readings from Last 3 Encounters:  03/03/21 (!)  148/82  11/13/20 140/90  02/29/20 (!) 168/110     She does not workout. She denies chest pain, shortness of breath, dizziness.   She has aortic atherosclerosis per xray 12/2016  She is on cholesterol medication and denies myalgias. Her cholesterol is at goal. The cholesterol last visit was:   Lab Results  Component Value Date   CHOL 165 11/13/2020   HDL 69 11/13/2020   LDLCALC 76 11/13/2020   TRIG 115 11/13/2020   CHOLHDL 2.4 11/13/2020   Patient reports that she has not been monitoring her diet.  She has a history of prediabetes which is diet controlled.  She has not symptoms of polydipsia, polyphagia, or polyuria.   Lab Results  Component Value Date   HGBA1C 5.5 11/13/2020     She has CKD III monitored at this office.  Lab Results  Component Value Date   EGFR 39 (L) 11/13/2020    Lab Results  Component Value Date   GFRNONAA 53 (L) 02/29/2020   GFRNONAA 48 (L) 08/10/2019   GFRNONAA 40 (L) 02/07/2019   Patient is on Vitamin D supplement. Lab Results  Component Value Date   VD25OH 50 11/13/2020     BMI is Body mass index is 22.86 kg/m., she is working on diet and exercise. Wt Readings from Last 3 Encounters:  03/03/21 121 lb (54.9 kg)  11/13/20 119 lb 3.2 oz (54.1 kg)  02/29/20 128 lb (58.1 kg)    Medication Review Current Outpatient Medications on File Prior to Visit  Medication Sig Dispense Refill   zinc gluconate 50 MG tablet Take 50 mg by mouth daily.     No current facility-administered medications on file prior to visit.    Allergies: Allergies  Allergen Reactions   Lopid [Gemfibrozil]     headache   Morphine And Related Other (See Comments)    Reaction: unknown    Nickel Other (See Comments)    Reaction: unknown    Xanax [Alprazolam]    Latex Rash    Current Problems (verified) has Hyperlipidemia, mixed; Hypertension; Hypothyroidism; Abnormal glucose; Vitamin D deficiency; GERD (gastroesophageal reflux disease); Medication management;  Insomnia; DDD (degenerative disc disease), lumbar; Aortic atherosclerosis ([place]); Alzheimer disease ([place]); CKD stage G3b/A2, GFR 30-44 and albumin creatinine ratio 30-299 mg/g (HCC); BMI 24.0-24.9, adult; Senile purpura ([place]); and Arm skin lesion, left on their problem list.  Screening Tests Immunization History  Administered Date(s) Administered   Influenza, High Dose Seasonal PF 09/19/2013, 10/16/2014, 10/08/2015, 11/30/2016   Influenza-Unspecified 09/15/2012, 11/05/2016, 11/05/2017, 10/06/2019, 11/13/2020   Pneumococcal Conjugate-13 08/08/2015   Pneumococcal-Unspecified 03/18/2010   Td 11/09/2006    Preventative care: Last colonoscopy: 2008 normal, willing to do cologuard Cologuard: 03/2018 due this year Last mammogram: 12/2017, would not pursue treatment DEXA: 2013  Left hip Xray 03/2013 MR spine 09/2015  Prior vaccinations: TD or Tdap: 2008 declines  Influenza: 10/2019 Pneumococcal: 2012 Prevnar13: 2017 Shingles/Zostavax: Declined   Names of Other Physician/Practitioners you currently use: 1. [facility]- here for primary care 2. Dr. Katy Fitch, eye doctor,3 years ago readers at home 3. New dentist in [place] - Dr. Lannie Fields, dentist, last 2022  Patient Care Team: Unk Pinto, MD as PCP - General (Internal Medicine)  Surgical: She  has a past surgical history that includes Tubal ligation; Eye surgery; Abdominal hysterectomy; Carpal tunnel release (Bilateral, 2001); and Nasal septum surgery. Family Her family history includes Alcohol abuse in her father; Depression in her sister; Heart disease in her brother, father, and sister; Hyperlipidemia in her father and mother; Hypertension in her father and mother; Stroke in her sister. Social history  She reports that she quit smoking about 32 years ago. Her smoking use included cigarettes. She has never used smokeless tobacco. She reports current alcohol use. No history on file for drug  use.  MEDICARE WELLNESS OBJECTIVES: Physical activity: Current Exercise Habits: Home exercise routine, Type of exercise: walking, Time (Minutes): 30, Frequency (Times/Week): 7, Weekly Exercise (Minutes/Week): 210, Exercise limited by: psychological condition(s) Cardiac risk factors: Cardiac Risk Factors include: advanced age (>85mn, >>47women);dyslipidemia;hypertension Depression/mood screen:   [facility] 03/03/2021  Decreased Interest 0  Down, Depressed, Hopeless 0  PHQ - 2 Score 0    ADLs:  In your present state of health, do you have any difficulty performing the following activities: 03/03/2021  Hearing? N  Vision? N  Difficulty concentrating or making decisions? N  Walking or climbing stairs? N  Dressing or bathing? N  Doing errands, shopping? Y  Some recent data might be hidden      Cognitive Testing  Alert? Yes  Normal Appearance?Yes  Oriented to person? Yes  Place? Yes   Time? No got month wrong  Recall of three objects?  1/3  Can perform simple calculations? No  Displays appropriate judgment? No  Can read the correct time from a watch face?Yes  EOL planning: Does Patient Have a Medical Advance Directive?: Yes Type of Advance Directive: Healthcare Power of Attorney, Living will Does patient want to make changes to medical advance directive?: No - Patient declined Copy of [place] Chart?: No - copy requested   Objective:   Today's Vitals   03/03/21 1529  BP: (!) 148/82  Pulse: 85  Temp: 97.7 F (36.5 C)  SpO2: 97%  Weight: 121 lb (54.9 kg)    Body mass index is 22.86 kg/m.  General appearance: alert, no distress, WD/WN,  female HEENT: normocephalic, sclerae anicteric, TMs pearly, nares patent, no discharge or erythema, pharynx normal Oral cavity: MMM, no lesions Neck: supple, no lymphadenopathy, no thyromegaly, no masses Heart: RRR, normal S1, S2, no murmurs Lungs: CTA bilaterally, no wheezes, rhonchi, or  rales Abdomen: +bs, soft, non tender, non distended, no masses, no hepatomegaly, no splenomegaly Musculoskeletal: nontender, no swelling, no obvious deformity, no SI tenderness, neg straight leg.  Extremities: no edema, no cyanosis, no clubbing Pulses: 2+ symmetric, upper and lower extremities, normal cap refill Neurological: alert, oriented x 3, CN2-12 intact, strength normal upper extremities and lower extremities, sensation normal throughout, DTRs 2+ throughout, no cerebellar signs, gait normal. Scattered thought process, poor short term recall Psychiatric: normal affect, behavior normal, pleasant  Derm: warm/dry intact; scattered small ecchymoses to bil uppper extremities; left forearm with erythematous/scaly irregular area approx 2 cm x 2 cm       Medicare Attestation I have personally reviewed: The patient's medical and social history Their use of alcohol, tobacco or illicit drugs Their current medications and supplements The patient's functional ability including ADLs,fall risks, home safety risks, cognitive, and hearing and visual impairment Diet and physical activities Evidence for depression or mood disorders  The patient's weight, height, BMI, and visual acuity have been recorded in the chart.  I have made referrals,  counseling, and provided education to the patient based on review of the above and I have provided the patient with a written personalized care plan for preventive services.     Magda Bernheim, NP   03/03/2021

## 2021-03-03 ENCOUNTER — Other Ambulatory Visit: Payer: Self-pay

## 2021-03-03 ENCOUNTER — Encounter: Payer: Self-pay | Admitting: Nurse Practitioner

## 2021-03-03 ENCOUNTER — Ambulatory Visit (INDEPENDENT_AMBULATORY_CARE_PROVIDER_SITE_OTHER): Payer: Medicare HMO | Admitting: Nurse Practitioner

## 2021-03-03 VITALS — BP 148/82 | HR 85 | Temp 97.7°F | Wt 121.0 lb

## 2021-03-03 DIAGNOSIS — N1832 Chronic kidney disease, stage 3b: Secondary | ICD-10-CM

## 2021-03-03 DIAGNOSIS — I700 Atherosclerosis of aorta: Secondary | ICD-10-CM | POA: Diagnosis not present

## 2021-03-03 DIAGNOSIS — R7309 Other abnormal glucose: Secondary | ICD-10-CM

## 2021-03-03 DIAGNOSIS — Z0001 Encounter for general adult medical examination with abnormal findings: Secondary | ICD-10-CM

## 2021-03-03 DIAGNOSIS — I10 Essential (primary) hypertension: Secondary | ICD-10-CM

## 2021-03-03 DIAGNOSIS — E039 Hypothyroidism, unspecified: Secondary | ICD-10-CM | POA: Diagnosis not present

## 2021-03-03 DIAGNOSIS — E782 Mixed hyperlipidemia: Secondary | ICD-10-CM

## 2021-03-03 DIAGNOSIS — M51369 Other intervertebral disc degeneration, lumbar region without mention of lumbar back pain or lower extremity pain: Secondary | ICD-10-CM

## 2021-03-03 DIAGNOSIS — Z79899 Other long term (current) drug therapy: Secondary | ICD-10-CM

## 2021-03-03 DIAGNOSIS — G309 Alzheimer's disease, unspecified: Secondary | ICD-10-CM

## 2021-03-03 DIAGNOSIS — G4700 Insomnia, unspecified: Secondary | ICD-10-CM | POA: Diagnosis not present

## 2021-03-03 DIAGNOSIS — D692 Other nonthrombocytopenic purpura: Secondary | ICD-10-CM | POA: Diagnosis not present

## 2021-03-03 DIAGNOSIS — R6889 Other general symptoms and signs: Secondary | ICD-10-CM | POA: Diagnosis not present

## 2021-03-03 DIAGNOSIS — E559 Vitamin D deficiency, unspecified: Secondary | ICD-10-CM | POA: Diagnosis not present

## 2021-03-03 DIAGNOSIS — K219 Gastro-esophageal reflux disease without esophagitis: Secondary | ICD-10-CM

## 2021-03-03 DIAGNOSIS — M5136 Other intervertebral disc degeneration, lumbar region: Secondary | ICD-10-CM

## 2021-03-03 DIAGNOSIS — F0280 Dementia in other diseases classified elsewhere without behavioral disturbance: Secondary | ICD-10-CM

## 2021-03-03 DIAGNOSIS — Z0000 Encounter for general adult medical examination without abnormal findings: Secondary | ICD-10-CM

## 2021-03-03 DIAGNOSIS — Z9189 Other specified personal risk factors, not elsewhere classified: Secondary | ICD-10-CM

## 2021-03-03 MED ORDER — LEVOTHYROXINE SODIUM 50 MCG PO TABS: ORAL_TABLET | ORAL | 3 refills | Status: DC

## 2021-03-03 MED ORDER — OLMESARTAN MEDOXOMIL 40 MG PO TABS: ORAL_TABLET | ORAL | 3 refills | Status: DC

## 2021-03-03 MED ORDER — ROSUVASTATIN CALCIUM 40 MG PO TABS: ORAL_TABLET | ORAL | 3 refills | Status: DC

## 2021-03-04 LAB — CBC WITH DIFFERENTIAL/PLATELET
Absolute Monocytes: 490 cells/uL (ref 200–950)
Basophils Absolute: 28 cells/uL (ref 0–200)
Basophils Relative: 0.4 %
Eosinophils Absolute: 98 cells/uL (ref 15–500)
Eosinophils Relative: 1.4 %
HCT: 42.4 % (ref 35.0–45.0)
Hemoglobin: 13.6 g/dL (ref 11.7–15.5)
Lymphs Abs: 2261 cells/uL (ref 850–3900)
MCH: 29.1 pg (ref 27.0–33.0)
MCHC: 32.1 g/dL (ref 32.0–36.0)
MCV: 90.8 fL (ref 80.0–100.0)
MPV: 10.8 fL (ref 7.5–12.5)
Monocytes Relative: 7 %
Neutro Abs: 4123 cells/uL (ref 1500–7800)
Neutrophils Relative %: 58.9 %
Platelets: 172 10*3/uL (ref 140–400)
RBC: 4.67 10*6/uL (ref 3.80–5.10)
RDW: 13.1 % (ref 11.0–15.0)
Total Lymphocyte: 32.3 %
WBC: 7 10*3/uL (ref 3.8–10.8)

## 2021-03-04 LAB — COMPLETE METABOLIC PANEL WITH GFR
AG Ratio: 1.7 (calc) (ref 1.0–2.5)
ALT: 9 U/L (ref 6–29)
AST: 19 U/L (ref 10–35)
Albumin: 4.5 g/dL (ref 3.6–5.1)
Alkaline phosphatase (APISO): 75 U/L (ref 37–153)
BUN/Creatinine Ratio: 13 (calc) (ref 6–22)
BUN: 17 mg/dL (ref 7–25)
CO2: 28 mmol/L (ref 20–32)
Calcium: 10.2 mg/dL (ref 8.6–10.4)
Chloride: 106 mmol/L (ref 98–110)
Creat: 1.33 mg/dL — ABNORMAL HIGH (ref 0.60–1.00)
Globulin: 2.7 g/dL (calc) (ref 1.9–3.7)
Glucose, Bld: 126 mg/dL — ABNORMAL HIGH (ref 65–99)
Potassium: 4.5 mmol/L (ref 3.5–5.3)
Sodium: 143 mmol/L (ref 135–146)
Total Bilirubin: 0.6 mg/dL (ref 0.2–1.2)
Total Protein: 7.2 g/dL (ref 6.1–8.1)
eGFR: 41 mL/min/{1.73_m2} — ABNORMAL LOW (ref >=60)

## 2021-03-04 LAB — LIPID PANEL
Cholesterol: 188 mg/dL (ref <200)
HDL: 87 mg/dL (ref >=50)
LDL Cholesterol (Calc): 78 mg/dL (calc)
Non-HDL Cholesterol (Calc): 101 mg/dL (calc) (ref <130)
Total CHOL/HDL Ratio: 2.2 (calc) (ref <5.0)
Triglycerides: 133 mg/dL (ref <150)

## 2021-03-04 LAB — TSH: TSH: 4.01 mIU/L (ref 0.40–4.50)

## 2021-03-04 LAB — MAGNESIUM: Magnesium: 2.2 mg/dL (ref 1.5–2.5)

## 2021-03-19 DIAGNOSIS — C44619 Basal cell carcinoma of skin of left upper limb, including shoulder: Secondary | ICD-10-CM | POA: Diagnosis not present

## 2021-04-23 DIAGNOSIS — C44612 Basal cell carcinoma of skin of right upper limb, including shoulder: Secondary | ICD-10-CM | POA: Diagnosis not present

## 2021-06-23 NOTE — Progress Notes (Unsigned)
Patient ID: Stacy Wallace, female   DOB: March 22, 1945, 76 y.o.   MRN: [id number]  3 MONTH FOLLOW UP  Assessment:    Aortic atherosclerosis ([place]) Control blood pressure, cholesterol, glucose, increase exercise.   Senile purpura ([place]) Reassured, protect skin  Essential hypertension - Restart Olmesartan, 30 day script sent to local pharmacy to begain today - DASH diet, exercise and monitor at home. Call if greater than 140/90.  -     CBC with Differential/Platelet -     CMP/GFR -     TSH  Alzheimer disease Continue follow up with neuro Exercise at least 20-30 mins a day Has been off of namenda/aricept; declines refill to restart at this time;  Encouraged daily log to remember med compliance, fluid intake, daily walking Daughter will help monitor   Hypothyroidism, unspecified type -check TSH level, restart medicaion, reminded to take on an empty stomach 30-71mns before food.  -     TSH  Mixed hyperlipidemia -restart medications, check lipids, decrease fatty foods, increase activity.  -     Lipid panel  Prediabetes Discussed disease progression and risks Discussed diet/exercise, weight management and risk modification A1c's have been at goal Check CMP, CBC   Medication management -    TSH  Vitamin D deficiency Defer check, restart supplement   Insomnia, unspecified type - good sleep hygiene discussed, increase day time activity   CKD 3 (HCC) Increase fluids, avoid NSAIDS, monitor sugars, will monitor Keep log to track fluid intake - CMP/GFR, CBC   Risk non-compliance with medications Patient has again run out of medications even though she has refills at the pharmacy  Have reviewed multiple times the importance of getting the refills in a timely manner to not run out of medication yet this is the 3rd time I have discussed this with husband and patient. - [facility]  Over 30 minutes of exam, counseling,  chart review, and critical decision making was performed  Future Appointments  Date Time Provider [place] 11/13/2021  2:00 PM WAlycia Rossetti NP [facility] None  03/03/2022  4:00 PM WAlycia Rossetti NP [facility] None       Subjective:   GYASMIN BRONAUGHis a 76y.o. female who presents for Medicare Annual Wellness Visit and 3 month follow up on hypertension, prediabetes, hyperlipidemia, vitamin D def.   She has Alzheimer's, followed by Dr. ARexene Alberts was on namenda/aricept;  She lives with husband in apartment, family checks on her, no longer driving. No falls, no wandering or concern with safety. Husband present with patient today. Apparently she ran out of medications, typically mail order, has been off of all meds for 2 weeks patient never contacted mail order pharmacy or office. She demonstrates very poor judgement. Daughter notes her memory has been much worse since off of thyroid medication-  Stacy BMI is Body mass index is 22.48 kg/m., she has not been working on diet and exercise. She is eating regularly, does like sweets but is trying to limit Wt Readings from Last 3 Encounters:  06/24/21 119 lb (54 kg)  03/03/21 121 lb (54.9 kg)  11/13/20 119 lb 3.2 oz (54.1 kg)   She has not been checking BPs at home, does have cuff, daughter can help, has been off of olmesartan 40 mg due to running out of medication, today their BP is BP: (!) 160/94  BP Readings from Last 3 Encounters:  06/24/21 (Marland Kitchen  160/94  03/03/21 (!) 148/82  11/13/20 140/90     She does not workout. She denies chest pain, shortness of breath, dizziness.   She has aortic atherosclerosis per xray 12/2016  She is on cholesterol medication and denies myalgias. Her cholesterol is at goal. The cholesterol last visit was:   Lab Results  Component Value Date   CHOL 188 03/03/2021   HDL 87 03/03/2021   LDLCALC 78 03/03/2021   TRIG 133 03/03/2021   CHOLHDL 2.2 03/03/2021   Patient  reports that she has not been monitoring her diet.  She has a history of prediabetes which is diet controlled.  She has not symptoms of polydipsia, polyphagia, or polyuria.   Lab Results  Component Value Date   HGBA1C 5.5 11/13/2020     She has CKD III monitored at this office. She is drinking more water throughout the day Lab Results  Component Value Date   EGFR 41 (L) 03/03/2021     Patient is on Vitamin D supplement. Lab Results  Component Value Date   VD25OH 50 11/13/2020     BMI is Body mass index is 22.48 kg/m., she is working on diet and exercise. Wt Readings from Last 3 Encounters:  06/24/21 119 lb (54 kg)  03/03/21 121 lb (54.9 kg)  11/13/20 119 lb 3.2 oz (54.1 kg)    Medication Review Current Outpatient Medications on File Prior to Visit  Medication Sig Dispense Refill   levothyroxine (SYNTHROID) 50 MCG tablet TAKE 1 TABLET EVERY DAY BEFORE BREAKFAST 90 tablet 3   olmesartan (BENICAR) 40 MG tablet Take 1 tablet Daily for BP 90 tablet 3   rosuvastatin (CRESTOR) 40 MG tablet Take 1 tablet Daily for Cholesterol 90 tablet 3   zinc gluconate 50 MG tablet Take 50 mg by mouth daily.     No current facility-administered medications on file prior to visit.    Allergies: Allergies  Allergen Reactions   Lopid [Gemfibrozil]     headache   Morphine And Related Other (See Comments)    Reaction: unknown    Nickel Other (See Comments)    Reaction: unknown    Xanax [Alprazolam]    Latex Rash    Current Problems (verified) has Hyperlipidemia, mixed; Hypertension; Hypothyroidism; Abnormal glucose; Vitamin D deficiency; GERD (gastroesophageal reflux disease); Medication management; Insomnia; DDD (degenerative disc disease), lumbar; Aortic atherosclerosis ([place]); Alzheimer disease ([place]); CKD stage G3b/A2, GFR 30-44 and albumin creatinine ratio 30-299 mg/g (HCC); BMI 24.0-24.9, adult; Senile purpura ([place]); and Arm skin lesion, left on their problem list.    Names of Other  Physician/Practitioners you currently use: 1. [facility]- here for primary care 2. Dr. Katy Fitch, eye doctor,3 years ago readers at home 3. New dentist in [place] - Dr. Lannie Fields, dentist, last 2022  Patient Care Team: Unk Pinto, MD as PCP - General (Internal Medicine)  Surgical: She  has a past surgical history that includes Tubal ligation; Eye surgery; Abdominal hysterectomy; Carpal tunnel release (Bilateral, 2001); and Nasal septum surgery. Family Her family history includes Alcohol abuse in her father; Depression in her sister; Heart disease in her brother, father, and sister; Hyperlipidemia in her father and mother; Hypertension in her father and mother; Stroke in her sister. Social history  She reports that she quit smoking about 33 years ago. Her smoking use included cigarettes. She has never used smokeless tobacco. She reports current alcohol use. No history on file for drug use.  Objective:   Today's Vitals  06/24/21 1427  BP: (!) 160/94  Pulse: 75  Temp: 97.8 F (36.6 C)  Weight: 119 lb (54 kg)     Body mass index is 22.48 kg/m.  General appearance: alert, no distress, WD/WN,  female HEENT: normocephalic, sclerae anicteric, TMs pearly, nares patent, no discharge or erythema, pharynx normal Oral cavity: MMM, no lesions Neck: supple, no lymphadenopathy, no thyromegaly, no masses Heart: RRR, normal S1, S2, no murmurs Lungs: CTA bilaterally, no wheezes, rhonchi, or rales Abdomen: +bs, soft, non tender, non distended, no masses, no hepatomegaly, no splenomegaly Musculoskeletal: nontender, no swelling, no obvious deformity, no SI tenderness, neg straight leg.  Extremities: no edema, no cyanosis, no clubbing Pulses: 2+ symmetric, upper and lower extremities, normal cap refill Neurological: alert, oriented x 3, CN2-12 intact, strength normal upper extremities and lower extremities, sensation normal throughout, DTRs 2+ throughout,  no cerebellar signs, gait normal. Scattered thought process, poor short term recall Psychiatric: normal affect, behavior normal, pleasant  Derm: warm/dry intact; scattered small ecchymoses to bil uppper extremities; left forearm with erythematous/scaly irregular area approx 2 cm x 2 cm     Alycia Rossetti, NP   06/24/2021

## 2021-06-24 ENCOUNTER — Ambulatory Visit (INDEPENDENT_AMBULATORY_CARE_PROVIDER_SITE_OTHER): Payer: Medicare HMO | Admitting: Nurse Practitioner

## 2021-06-24 ENCOUNTER — Encounter: Payer: Self-pay | Admitting: Nurse Practitioner

## 2021-06-24 VITALS — BP 160/94 | HR 75 | Temp 97.8°F | Wt 119.0 lb

## 2021-06-24 DIAGNOSIS — F0280 Dementia in other diseases classified elsewhere without behavioral disturbance: Secondary | ICD-10-CM

## 2021-06-24 DIAGNOSIS — E039 Hypothyroidism, unspecified: Secondary | ICD-10-CM

## 2021-06-24 DIAGNOSIS — N1832 Chronic kidney disease, stage 3b: Secondary | ICD-10-CM

## 2021-06-24 DIAGNOSIS — E559 Vitamin D deficiency, unspecified: Secondary | ICD-10-CM | POA: Diagnosis not present

## 2021-06-24 DIAGNOSIS — I700 Atherosclerosis of aorta: Secondary | ICD-10-CM | POA: Diagnosis not present

## 2021-06-24 DIAGNOSIS — Z79899 Other long term (current) drug therapy: Secondary | ICD-10-CM

## 2021-06-24 DIAGNOSIS — R7309 Other abnormal glucose: Secondary | ICD-10-CM | POA: Diagnosis not present

## 2021-06-24 DIAGNOSIS — D692 Other nonthrombocytopenic purpura: Secondary | ICD-10-CM | POA: Diagnosis not present

## 2021-06-24 DIAGNOSIS — E782 Mixed hyperlipidemia: Secondary | ICD-10-CM

## 2021-06-24 DIAGNOSIS — Z9189 Other specified personal risk factors, not elsewhere classified: Secondary | ICD-10-CM

## 2021-06-24 DIAGNOSIS — G309 Alzheimer's disease, unspecified: Secondary | ICD-10-CM

## 2021-06-24 DIAGNOSIS — G4700 Insomnia, unspecified: Secondary | ICD-10-CM

## 2021-06-24 DIAGNOSIS — I10 Essential (primary) hypertension: Secondary | ICD-10-CM

## 2021-06-24 MED ORDER — OLMESARTAN MEDOXOMIL 40 MG PO TABS: 40.0000 mg | ORAL_TABLET | Freq: Every day | ORAL | 0 refills | Status: DC

## 2021-06-25 ENCOUNTER — Encounter: Payer: Self-pay | Admitting: Nurse Practitioner

## 2021-06-25 LAB — COMPLETE METABOLIC PANEL WITH GFR
AG Ratio: 1.8 (calc) (ref 1.0–2.5)
ALT: 12 U/L (ref 6–29)
AST: 22 U/L (ref 10–35)
Albumin: 4.6 g/dL (ref 3.6–5.1)
Alkaline phosphatase (APISO): 66 U/L (ref 37–153)
BUN/Creatinine Ratio: 14 (calc) (ref 6–22)
BUN: 17 mg/dL (ref 7–25)
CO2: 26 mmol/L (ref 20–32)
Calcium: 10.4 mg/dL (ref 8.6–10.4)
Chloride: 107 mmol/L (ref 98–110)
Creat: 1.23 mg/dL — ABNORMAL HIGH (ref 0.60–1.00)
Globulin: 2.5 g/dL (calc) (ref 1.9–3.7)
Glucose, Bld: 90 mg/dL (ref 65–99)
Potassium: 4.2 mmol/L (ref 3.5–5.3)
Sodium: 143 mmol/L (ref 135–146)
Total Bilirubin: 0.7 mg/dL (ref 0.2–1.2)
Total Protein: 7.1 g/dL (ref 6.1–8.1)
eGFR: 45 mL/min/{1.73_m2} — ABNORMAL LOW (ref >=60)

## 2021-06-25 LAB — CBC WITH DIFFERENTIAL/PLATELET
Absolute Monocytes: 544 cells/uL (ref 200–950)
Basophils Absolute: 24 cells/uL (ref 0–200)
Basophils Relative: 0.3 %
Eosinophils Absolute: 88 cells/uL (ref 15–500)
Eosinophils Relative: 1.1 %
HCT: 41.4 % (ref 35.0–45.0)
Hemoglobin: 13.8 g/dL (ref 11.7–15.5)
Lymphs Abs: 2384 cells/uL (ref 850–3900)
MCH: 29 pg (ref 27.0–33.0)
MCHC: 33.3 g/dL (ref 32.0–36.0)
MCV: 87 fL (ref 80.0–100.0)
MPV: 10.6 fL (ref 7.5–12.5)
Monocytes Relative: 6.8 %
Neutro Abs: 4960 cells/uL (ref 1500–7800)
Neutrophils Relative %: 62 %
Platelets: 161 10*3/uL (ref 140–400)
RBC: 4.76 10*6/uL (ref 3.80–5.10)
RDW: 13.7 % (ref 11.0–15.0)
Total Lymphocyte: 29.8 %
WBC: 8 10*3/uL (ref 3.8–10.8)

## 2021-06-25 LAB — LIPID PANEL
Cholesterol: 176 mg/dL (ref <200)
HDL: 85 mg/dL (ref >=50)
LDL Cholesterol (Calc): 71 mg/dL (calc)
Non-HDL Cholesterol (Calc): 91 mg/dL (calc) (ref <130)
Total CHOL/HDL Ratio: 2.1 (calc) (ref <5.0)
Triglycerides: 118 mg/dL (ref <150)

## 2021-06-25 LAB — TSH: TSH: 2.69 mIU/L (ref 0.40–4.50)

## 2021-07-30 ENCOUNTER — Other Ambulatory Visit: Payer: Self-pay | Admitting: Nurse Practitioner

## 2021-07-30 DIAGNOSIS — I10 Essential (primary) hypertension: Secondary | ICD-10-CM

## 2021-08-11 ENCOUNTER — Other Ambulatory Visit: Payer: Self-pay

## 2021-08-11 NOTE — Patient Outreach (Signed)
[place] [facility]) Care Management  08/11/2021  SHAMIKIA LINSKEY 01-19-1945 [id number]   Telephone call to patient for nurse call.  No answer.  HIPAA compliant voice message left.    Plan: RN CM will attempt again within 4 business days and send letter.   Jone Baseman, RN, MSN [facility] Care Management Care Management Coordinator Direct Line [phone] Toll Free: [phone]  Fax: [phone]

## 2021-08-13 ENCOUNTER — Other Ambulatory Visit: Payer: Self-pay

## 2021-08-13 NOTE — Patient Outreach (Signed)
[facility]) Care Management  08/13/2021  KIASHA BELLIN 22-Dec-1945 [id number]   Telephone call to patient for nurse call.  No answer.  HIPAA compliant voice message left.    Plan: RN CM will attempt again within 4 business days.  Jone Baseman, RN, MSN [facility] Care Management Care Management Coordinator Direct Line [phone] Toll Free: [phone]  Fax: [phone]

## 2021-08-19 ENCOUNTER — Other Ambulatory Visit: Payer: Self-pay

## 2021-08-19 NOTE — Patient Outreach (Signed)
[place] [facility]) Care Management  08/19/2021  Stacy Wallace 1945/12/17 [id number]   Telephone call to patient for nurse call.  No answer.  HIPAA compliant voice message left.    Plan: RN CM will attempt again within 3 weeks.  UPDATE: Incoming call back from daughter Stacy Wallace.  Advised reason for call. She declines any needs and appreciates the call.   Plan: RN CM will close case.    Jone Baseman, RN, MSN [facility] Care Management Care Management Coordinator Direct Line [phone] Toll Free: [phone]  Fax: [phone]

## 2021-09-24 ENCOUNTER — Ambulatory Visit: Payer: Medicare HMO | Admitting: Internal Medicine

## 2021-09-24 ENCOUNTER — Encounter: Payer: Self-pay | Admitting: Internal Medicine

## 2021-09-24 VITALS — BP 138/78 | HR 88 | Temp 97.9°F | Resp 17 | Ht 61.0 in | Wt 116.6 lb

## 2021-09-24 DIAGNOSIS — E559 Vitamin D deficiency, unspecified: Secondary | ICD-10-CM

## 2021-09-24 DIAGNOSIS — I10 Essential (primary) hypertension: Secondary | ICD-10-CM | POA: Diagnosis not present

## 2021-09-24 DIAGNOSIS — E782 Mixed hyperlipidemia: Secondary | ICD-10-CM

## 2021-09-24 DIAGNOSIS — F01A0 Vascular dementia, mild, without behavioral disturbance, psychotic disturbance, mood disturbance, and anxiety: Secondary | ICD-10-CM | POA: Diagnosis not present

## 2021-09-24 DIAGNOSIS — Z79899 Other long term (current) drug therapy: Secondary | ICD-10-CM

## 2021-09-24 DIAGNOSIS — E039 Hypothyroidism, unspecified: Secondary | ICD-10-CM

## 2021-09-24 DIAGNOSIS — R7309 Other abnormal glucose: Secondary | ICD-10-CM | POA: Diagnosis not present

## 2021-09-24 NOTE — Progress Notes (Signed)
/ Future Appointments  Date Time Provider Department  09/24/2021  2:30 PM Unk Pinto, MD [facility]  12/25/2021               cpe  2:00 PM Alycia Rossetti, NP [facility]  04/01/2022                  wellness  2:30 PM Alycia Rossetti, NP [facility]     History of Present Illness:       This very nice 76 y.o.  MWF presents for 6 month follow up with HTN, HLD, Pre-Diabetes, Hypothyroid and Vitamin D Deficiency. Patient is also followed by Dr Rexene Alberts for  moderate vascular Dementia.       Patient is treated for HTN since 1999  & BP has been controlled at home. Today's BP is                                  .  Patient is also followed for Stage 3a CKD. Patient has had no complaints of any cardiac type chest pain, palpitations, dyspnea / orthopnea / PND, dizziness, claudication, or dependent edema.      Hyperlipidemia is controlled with diet & Rosuvastatin. Patient denies myalgias or other med SE's. Last Lipids were at goal :  Lab Results  Component Value Date   CHOL 176 06/24/2021   HDL 85 06/24/2021   LDLCALC 71 06/24/2021   TRIG 118 06/24/2021   CHOLHDL 2.1 06/24/2021    Also, the patient has history of PreDiabetes (A1c 5.8% / 2011)  and has had no symptoms of reactive hypoglycemia, diabetic polys, paresthesias or visual blurring.  Last A1c was at goal :  Lab Results  Component Value Date   HGBA1C 5.5 11/13/2020           Patient has been on thyroid replacement since 2014.        Further, the patient also has history of Vitamin D Deficiency and supplements vitamin D without any suspected side-effects. Last vitamin D was slightly Low:  Lab Results  Component Value Date   VD25OH 28 11/13/2020    Current Outpatient Medications on File Prior to Visit  Medication Sig   levothyroxine (SYNTHROID) 50 MCG tablet TAKE 1 TABLET EVERY DAY BEFORE BREAKFAST   olmesartan (BENICAR) 40 MG tablet Take 1 tablet Daily for BP   olmesartan (BENICAR) 40 MG tablet TAKE 1 TABLET BY  MOUTH EVERY DAY   rosuvastatin (CRESTOR) 40 MG tablet Take 1 tablet Daily for Cholesterol   zinc gluconate 50 MG tablet Take 50 mg by mouth daily.     Allergies  Allergen Reactions   Lopid [Gemfibrozil]     headache   Morphine And Related Other (See Comments)    Reaction: unknown    Nickel Other (See Comments)    Reaction: unknown    Xanax [Alprazolam]    Latex Rash    PMHx:   Past Medical History:  Diagnosis Date   GERD (gastroesophageal reflux disease)    Hyperlipidemia    Hypertension    Other abnormal glucose    hypothyroidism    vitamin D deficiency     Immunization History  Administered Date(s) Administered   Influenza, High Dose  09/19/2013, 10/16/2014, 10/08/2015, 11/30/2016   Influenza 11/05/2017, 10/06/2019, 11/13/2020   Pneumococcal -13 08/08/2015   Pneumococcal-23 03/18/2010   Td 11/09/2006    Past Surgical History:  Procedure Laterality Date   ABDOMINAL HYSTERECTOMY     CARPAL TUNNEL RELEASE Bilateral 2001   EYE SURGERY     NASAL SEPTUM SURGERY     TUBAL LIGATION      FHx:    Reviewed / unchanged  SHx:    Reviewed / unchanged   Systems Review:  Constitutional: Denies fever, chills, wt changes, headaches, insomnia, fatigue, night sweats, change in appetite. Eyes: Denies redness, blurred vision, diplopia, discharge, itchy, watery eyes.  ENT: Denies discharge, congestion, post nasal drip, epistaxis, sore throat, earache, hearing loss, dental pain, tinnitus, vertigo, sinus pain, snoring.  CV: Denies chest pain, palpitations, irregular heartbeat, syncope, dyspnea, diaphoresis, orthopnea, PND, claudication or edema. Respiratory: denies cough, dyspnea, DOE, pleurisy, hoarseness, laryngitis, wheezing.  Gastrointestinal: Denies dysphagia, odynophagia, heartburn, reflux, water brash, abdominal pain or cramps, nausea, vomiting, bloating, diarrhea, constipation, hematemesis, melena, hematochezia  or hemorrhoids. Genitourinary: Denies dysuria, frequency,  urgency, nocturia, hesitancy, discharge, hematuria or flank pain. Musculoskeletal: Denies arthralgias, myalgias, stiffness, jt. swelling, pain, limping or strain/sprain.  Skin: Denies pruritus, rash, hives, warts, acne, eczema or change in skin lesion(s). Neuro: No weakness, tremor, incoordination, spasms, paresthesia or pain. Psychiatric: Denies confusion, memory loss or sensory loss. Endo: Denies change in weight, skin or hair change.  Heme/Lymph: No excessive bleeding, bruising or enlarged lymph nodes.  Physical Exam  There were no vitals taken for this visit.  Appears  well nourished  and in no distress.  Eyes: PERRLA, EOMs, conjunctiva no swelling or erythema. Sinuses: No frontal/maxillary tenderness ENT/Mouth: EAC's clear, TM's nl w/o erythema, bulging. Nares clear w/o erythema, swelling, exudates. Oropharynx clear without erythema or exudates. Oral hygiene is good. Tongue normal, non obstructing. Hearing intact.  Neck: Supple. Thyroid not palpable. Car 2+/2+ without bruits, nodes or JVD. Chest: Respirations nl with BS clear & equal w/o rales, rhonchi, wheezing or stridor.  Cor: Heart sounds normal w/ regular rate and rhythm without sig. murmurs, gallops, clicks or rubs. Peripheral pulses normal and equal  without edema.  Abdomen: Soft & bowel sounds normal. Non-tender w/o guarding, rebound, hernias, masses or organomegaly.  Lymphatics: Unremarkable.  Musculoskeletal: Full ROM all peripheral extremities, joint stability, 5/5 strength and normal gait.  Skin: Warm, dry without exposed rashes, lesions or ecchymosis apparent.  Neuro: Cranial nerves intact, reflexes equal bilaterally. Sensory-motor testing grossly intact. Tendon reflexes grossly intact.  Pysch: Alert & oriented x 3.  Insight and judgement are poor or limited. . No ideations.  Assessment and Plan:  1. Essential hypertension  - Continue medication, monitor blood pressure at home.  - Continue DASH diet.  Reminder to go  to the ER if any CP,  SOB, nausea, dizziness, severe HA, changes vision/speech.  - CBC with Differential/Platelet - COMPLETE METABOLIC PANEL WITH GFR - Magnesium - TSH  2. Hyperlipidemia, mixed  - Continue diet/meds, exercise,& lifestyle modifications.  - Continue monitor periodic cholesterol/liver & renal functions   - Lipid panel - TSH  3. Abnormal glucose  - Continue diet, exercise  - Lifestyle modifications.  - Monitor appropriate labs.  - Hemoglobin A1c - Insulin, random  4. Vitamin D deficiency  - Continue supplementation.  - VITAMIN D 25 Hydroxy  5. Hypothyroidism  - TSH  6. Vascular dementia without behavioral disturbance ([place])   7. Medication management  - CBC with Differential/Platelet - COMPLETE METABOLIC PANEL WITH GFR - Magnesium - Lipid panel - TSH - Hemoglobin A1c - Insulin, random - VITAMIN D 25 Hydroxy       Discussed  regular  exercise, BP monitoring, weight control to achieve/maintain BMI less than 25 and discussed med and SE's. Recommended labs to assess and monitor clinical status with further disposition pending results of labs.  I discussed the assessment and treatment plan with the patient. The patient was provided an opportunity to ask questions and all were answered.   I provided over 30 minutes of exam, counseling, chart review and  complex critical decision making.   Kirtland Bouchard, MD

## 2021-09-24 NOTE — Patient Instructions (Signed)

## 2021-09-25 LAB — CBC WITH DIFFERENTIAL/PLATELET
Absolute Monocytes: 679 cells/uL (ref 200–950)
Basophils Absolute: 17 cells/uL (ref 0–200)
Basophils Relative: 0.2 %
Eosinophils Absolute: 69 cells/uL (ref 15–500)
Eosinophils Relative: 0.8 %
HCT: 40.5 % (ref 35.0–45.0)
Hemoglobin: 13.3 g/dL (ref 11.7–15.5)
Lymphs Abs: 2236 cells/uL (ref 850–3900)
MCH: 29.4 pg (ref 27.0–33.0)
MCHC: 32.8 g/dL (ref 32.0–36.0)
MCV: 89.6 fL (ref 80.0–100.0)
MPV: 10.7 fL (ref 7.5–12.5)
Monocytes Relative: 7.9 %
Neutro Abs: 5599 cells/uL (ref 1500–7800)
Neutrophils Relative %: 65.1 %
Platelets: 159 10*3/uL (ref 140–400)
RBC: 4.52 10*6/uL (ref 3.80–5.10)
RDW: 13.7 % (ref 11.0–15.0)
Total Lymphocyte: 26 %
WBC: 8.6 10*3/uL (ref 3.8–10.8)

## 2021-09-25 LAB — COMPLETE METABOLIC PANEL WITH GFR
AG Ratio: 1.6 (calc) (ref 1.0–2.5)
ALT: 10 U/L (ref 6–29)
AST: 21 U/L (ref 10–35)
Albumin: 4.4 g/dL (ref 3.6–5.1)
Alkaline phosphatase (APISO): 79 U/L (ref 37–153)
BUN/Creatinine Ratio: 11 (calc) (ref 6–22)
BUN: 15 mg/dL (ref 7–25)
CO2: 30 mmol/L (ref 20–32)
Calcium: 10.2 mg/dL (ref 8.6–10.4)
Chloride: 106 mmol/L (ref 98–110)
Creat: 1.33 mg/dL — ABNORMAL HIGH (ref 0.60–1.00)
Globulin: 2.7 g/dL (calc) (ref 1.9–3.7)
Glucose, Bld: 81 mg/dL (ref 65–99)
Potassium: 4.7 mmol/L (ref 3.5–5.3)
Sodium: 145 mmol/L (ref 135–146)
Total Bilirubin: 0.6 mg/dL (ref 0.2–1.2)
Total Protein: 7.1 g/dL (ref 6.1–8.1)
eGFR: 41 mL/min/{1.73_m2} — ABNORMAL LOW (ref >=60)

## 2021-09-25 LAB — INSULIN, RANDOM: Insulin: 31.3 u[IU]/mL — ABNORMAL HIGH

## 2021-09-25 LAB — VITAMIN D 25 HYDROXY (VIT D DEFICIENCY, FRACTURES): Vit D, 25-Hydroxy: 53 ng/mL (ref 30–100)

## 2021-09-25 LAB — LIPID PANEL
Cholesterol: 162 mg/dL (ref <200)
HDL: 66 mg/dL (ref >=50)
LDL Cholesterol (Calc): 75 mg/dL (calc)
Non-HDL Cholesterol (Calc): 96 mg/dL (calc) (ref <130)
Total CHOL/HDL Ratio: 2.5 (calc) (ref <5.0)
Triglycerides: 131 mg/dL (ref <150)

## 2021-09-25 LAB — HEMOGLOBIN A1C
Hgb A1c MFr Bld: 5.6 % of total Hgb (ref <5.7)
Mean Plasma Glucose: 114 mg/dL
eAG (mmol/L): 6.3 mmol/L

## 2021-09-25 LAB — TSH: TSH: 2.26 mIU/L (ref 0.40–4.50)

## 2021-09-25 LAB — MAGNESIUM: Magnesium: 2.4 mg/dL (ref 1.5–2.5)

## 2021-09-25 NOTE — Progress Notes (Signed)
<><><><><><><><><><><><><><><><><><><><><><><><><><><><><><><><><> <><><><><><><><><><><><><><><><><><><><><><><><><><><><><><><><><>  -   Kidney functions Stage 3b & Stable - Great !   <><><><><><><><><><><><><><><><><><><><><><><><><><><><><><><><><> <><><><><><><><><><><><><><><><><><><><><><><><><><><><><><><><><>  -  Vit D = 53 - Great !    Please keep dose same  <><><><><><><><><><><><><><><><><><><><><><><><><><><><><><><><><>  -  Total Chol = 162 - Excellent  - Continue Rosuvastatin / Crestor Same   <><><><><><><><><><><><><><><><><><><><><><><><><><><><><><><><><>  -  A1c - Normal - No Diabetes  - Great ! <><><><><><><><><><><><><><><><><><><><><><><><><><><><><><><><><>  -  All Great   - Keep up the [place] Work !   <><><><><><><><><><><><><><><><><><><><><><><><><><><><><><><><><> <><><><><><><><><><><><><><><><><><><><><><><><><><><><><><><><><>

## 2021-10-11 ENCOUNTER — Other Ambulatory Visit: Payer: Self-pay | Admitting: Internal Medicine

## 2021-10-11 DIAGNOSIS — I10 Essential (primary) hypertension: Secondary | ICD-10-CM

## 2021-11-13 ENCOUNTER — Encounter: Payer: Medicare HMO | Admitting: Nurse Practitioner

## 2021-12-24 NOTE — Progress Notes (Unsigned)
Patient ID: Stacy Wallace, female   DOB: 03/26/1945, 76 y.o.   MRN: [id number]  COMPLETE PHYSICAL EXAM  Assessment:   Encounter for General adult medical examination with abnormal findings Due annually   Aortic atherosclerosis ([place]) Control blood pressure, cholesterol, glucose, increase exercise.  EKG  Senile purpura (HCC) Reassured, protect skin Avoid picking/scratching at areas  Essential hypertension - Continue olmesartan 40 mg daily - DASH diet, exercise and monitor at home. Call if greater than 140/90.  -     CBC with Differential/Platelet -     CMP/GFR -     TSH - EKG  Alzheimer disease Continue follow up with neuro Exercise at least 20-30 mins a day Encouraged daily log to remember med compliance, fluid intake, daily walking Daughter will help monitor   Hypothyroidism, unspecified type -check TSH level, restart medicaion, reminded to take on an empty stomach 30-67mns before food.  -     TSH  Mixed hyperlipidemia -restart medications, check lipids, decrease fatty foods, increase activity.  -     Lipid panel CMP CBC  Prediabetes Discussed disease progression and risks Discussed diet/exercise, weight management and risk modification  - A1C   Medication management -     Magnesium  Vitamin D deficiency Vit D, would like to see in therapeutic range of 60-100  Insomnia, unspecified type - good sleep hygiene discussed, increase day time activity  DDD (degenerative disc disease), lumbar Increase walking; tylenol PRN  Gastroesophageal reflux disease, esophagitis presence not specified Off of meds; denies sx Magnesium   CKD 3a ([place] Increase fluids, avoid NSAIDS, monitor sugars, will monitor Keep log to track fluid intake - CMP/GFR Routine urine with reflex microscopic Microalbumin/creatinine urine ration  Risk non-compliance with medications Sent in 1 year supply to mail order pharmacy Keep daily log of meds if taken Husband is present reminded to  call if does not receive medicine by Monday  Over 40 minutes of exam, counseling, chart review, and critical decision making was performed  Future Appointments  Date Time Provider [place] 12/25/2021  2:00 PM WAlycia Rossetti NP [facility] None  04/01/2022  2:30 PM WAlycia Rossetti NP [facility] None  12/31/2022  2:00 PM WAlycia Rossetti NP [facility] None      Subjective:   Stacy WYSOCKIis a 76y.o. female who presents for Medicare Annual Wellness Visit and 3 month follow up on hypertension, prediabetes, hyperlipidemia, vitamin D def.   She has Alzheimer's, no longer on medication or following with Dr. ARexene Alberts She lives with husband in apartment, family checks on her, no longer driving. No falls, no wandering or concern with safety. Daughter notes she doesn't drink enough fluids. Apparently she ran out of medications, typically mail order, has been off of all meds for several weeks, unsure, patient never contacted mail order pharmacy or office. She demonstrates very poor judgement. Husband is present with pt and states they would not refill medications from mail order pharmacy but when 1 checked there was 3 refills.  BMI is There is no height or weight on file to calculate BMI., she has been walking in her apartment complex Wt Readings from Last 3 Encounters:  09/24/21 116 lb 9.6 oz (52.9 kg)  06/24/21 119 lb (54 kg)  03/03/21 121 lb (54.9 kg)   She has not been checking BPs at home, does have cuff, daughter can help, has been off of olmesartan 40 mg , today their BP is     She does not  workout. She denies chest pain, shortness of breath, dizziness.   She has aortic atherosclerosis per xray 12/2016  She is on cholesterol medication and denies myalgias. Her cholesterol is at goal. The cholesterol last visit was:   Lab Results  Component Value Date   CHOL 162 09/24/2021   HDL 66 09/24/2021   LDLCALC 75 09/24/2021   TRIG 131 09/24/2021   CHOLHDL 2.5 09/24/2021    Patient reports that she has not been monitoring her diet.  She has a history of prediabetes which is diet controlled.  She has not symptoms of polydipsia, polyphagia, or polyuria.   Lab Results  Component Value Date   HGBA1C 5.6 09/24/2021   Last GFR Lab Results  Component Value Date   GFRNONAA 53 (L) 02/29/2020   She has CKD III monitored at this office.  Lab Results  Component Value Date   GFRNONAA 53 (L) 02/29/2020   GFRNONAA 48 (L) 08/10/2019   GFRNONAA 40 (L) 02/07/2019   Patient is on Vitamin D supplement. Lab Results  Component Value Date   VD25OH 53 09/24/2021     Medication Review Current Outpatient Medications on File Prior to Visit  Medication Sig Dispense Refill   levothyroxine (SYNTHROID) 50 MCG tablet TAKE 1 TABLET EVERY DAY BEFORE BREAKFAST 90 tablet 3   olmesartan (BENICAR) 40 MG tablet Take  1 tablet  Daily for BP                          (Dx:  i10)                                                           /                  TAKE                              BY                                MOUTH 90 tablet 3   rosuvastatin (CRESTOR) 40 MG tablet Take 1 tablet Daily for Cholesterol 90 tablet 3   zinc gluconate 50 MG tablet Take 50 mg by mouth daily.     No current facility-administered medications on file prior to visit.    Allergies: Allergies  Allergen Reactions   Lopid [Gemfibrozil]     headache   Morphine And Related Other (See Comments)    Reaction: unknown    Nickel Other (See Comments)    Reaction: unknown    Xanax [Alprazolam]    Latex Rash    Current Problems (verified) has Hyperlipidemia, mixed; Hypertension; Hypothyroidism; Abnormal glucose; Vitamin D deficiency; GERD (gastroesophageal reflux disease); Medication management; Insomnia; DDD (degenerative disc disease), lumbar; Aortic atherosclerosis ([place]); Alzheimer disease ([place]); CKD stage G3b/A2, GFR 30-44 and albumin creatinine ratio 30-299 mg/g (HCC); BMI 24.0-24.9, adult; Senile  purpura ([place]); and Arm skin lesion, left on their problem list.  Screening Tests Immunization History  Administered Date(s) Administered   Influenza, High Dose Seasonal PF 09/19/2013, 10/16/2014, 10/08/2015, 11/30/2016   Influenza-Unspecified 09/15/2012, 11/05/2016, 11/05/2017, 10/06/2019, 11/13/2020   Pneumococcal Conjugate-13 08/08/2015   Pneumococcal-Unspecified  03/18/2010   Td 11/09/2006    Preventative care: Last colonoscopy: 2008 normal, willing to do cologuard Cologuard: 03/2018 Last mammogram: 12/2017, would not pursue treatment declines DEXA: 2013  Left hip Xray 03/2013 MR spine 09/2015  Prior vaccinations: TD or Tdap: 2008 declines  Influenza: 2022 Pneumococcal: 2012 Prevnar13: 2017 Shingles/Zostavax: Declined   Names of Other Physician/Practitioners you currently use: 1. [facility]- here for primary care 2. Dr. Katy Fitch, eye doctor, 5+ years ago, readers at home 3. New dentist in [place] - Dr. Lannie Fields, dentist, last 2021  Patient Care Team: Unk Pinto, MD as PCP - General (Internal Medicine)  Surgical: She  has a past surgical history that includes Tubal ligation; Eye surgery; Abdominal hysterectomy; Carpal tunnel release (Bilateral, 2001); and Nasal septum surgery. Family Her family history includes Alcohol abuse in her father; Depression in her sister; Heart disease in her brother, father, and sister; Hyperlipidemia in her father and mother; Hypertension in her father and mother; Stroke in her sister. Social history  She reports that she quit smoking about 33 years ago. Her smoking use included cigarettes. She has never used smokeless tobacco. She reports current alcohol use. No history on file for drug use.  Review of Systems  Constitutional:  Negative for chills and fever.  HENT:  Negative for congestion, hearing loss, sinus pain, sore throat and tinnitus.   Eyes:  Negative for blurred vision and double vision.   Respiratory:  Negative for cough, hemoptysis, sputum production, shortness of breath and wheezing.   Cardiovascular:  Negative for chest pain, palpitations and leg swelling.  Gastrointestinal:  Negative for abdominal pain, constipation, diarrhea, heartburn, nausea and vomiting.  Genitourinary:  Negative for dysuria and urgency.  Musculoskeletal:  Negative for back pain, falls, joint pain, myalgias and neck pain.  Skin:  Negative for rash.  Neurological:  Negative for dizziness, tingling, tremors, weakness and headaches.  Endo/Heme/Allergies:  Bruises/bleeds easily.  Psychiatric/Behavioral:  Positive for memory loss. Negative for depression and suicidal ideas. The patient is not nervous/anxious and does not have insomnia.      Objective:   There were no vitals filed for this visit.   There is no height or weight on file to calculate BMI.  General appearance: alert, no distress, WD/WN,  female HEENT: normocephalic, sclerae anicteric, TMs pearly, nares patent, no discharge or erythema, pharynx normal Oral cavity: MMM, no lesions Neck: supple, no lymphadenopathy, no thyromegaly, no masses Heart: RRR, normal S1, S2, no murmurs Lungs: CTA bilaterally, no wheezes, rhonchi, or rales Abdomen: +bs, soft, non tender, non distended, no masses, no hepatomegaly, no splenomegaly Musculoskeletal: nontender, no swelling, no obvious deformity, no SI tenderness, neg straight leg.  Extremities: no edema, no cyanosis, no clubbing Pulses: 2+ symmetric, upper and lower extremities, normal cap refill Neurological: alert, oriented x 3, CN2-12 intact, strength normal upper extremities and lower extremities, sensation normal throughout, DTRs 2+ throughout, no cerebellar signs, gait normal. Scattered thought process, poor short term recall Psychiatric: normal affect, behavior normal, pleasant , 0/3 word recall Derm: warm/dry intact; scattered small ecchymoses to bil uppper extremities; left forearm with  erythematous/scaly irregular area approx 2 cm x 2 cm- unchanged from previous photo 02/29/20  EKG: NSR, no change from previous tracing    Alycia Rossetti, NP   12/24/2021

## 2021-12-25 ENCOUNTER — Ambulatory Visit (INDEPENDENT_AMBULATORY_CARE_PROVIDER_SITE_OTHER): Payer: Medicare HMO | Admitting: Nurse Practitioner

## 2021-12-25 ENCOUNTER — Encounter: Payer: Self-pay | Admitting: Nurse Practitioner

## 2021-12-25 VITALS — BP 158/88 | HR 77 | Temp 97.5°F | Ht 60.0 in | Wt 117.2 lb

## 2021-12-25 DIAGNOSIS — R7309 Other abnormal glucose: Secondary | ICD-10-CM

## 2021-12-25 DIAGNOSIS — I700 Atherosclerosis of aorta: Secondary | ICD-10-CM | POA: Diagnosis not present

## 2021-12-25 DIAGNOSIS — E782 Mixed hyperlipidemia: Secondary | ICD-10-CM | POA: Diagnosis not present

## 2021-12-25 DIAGNOSIS — K219 Gastro-esophageal reflux disease without esophagitis: Secondary | ICD-10-CM

## 2021-12-25 DIAGNOSIS — N1832 Chronic kidney disease, stage 3b: Secondary | ICD-10-CM | POA: Diagnosis not present

## 2021-12-25 DIAGNOSIS — Z9189 Other specified personal risk factors, not elsewhere classified: Secondary | ICD-10-CM

## 2021-12-25 DIAGNOSIS — M51369 Other intervertebral disc degeneration, lumbar region without mention of lumbar back pain or lower extremity pain: Secondary | ICD-10-CM

## 2021-12-25 DIAGNOSIS — Z1389 Encounter for screening for other disorder: Secondary | ICD-10-CM

## 2021-12-25 DIAGNOSIS — E039 Hypothyroidism, unspecified: Secondary | ICD-10-CM

## 2021-12-25 DIAGNOSIS — F0280 Dementia in other diseases classified elsewhere without behavioral disturbance: Secondary | ICD-10-CM

## 2021-12-25 DIAGNOSIS — R9431 Abnormal electrocardiogram [ECG] [EKG]: Secondary | ICD-10-CM

## 2021-12-25 DIAGNOSIS — I10 Essential (primary) hypertension: Secondary | ICD-10-CM

## 2021-12-25 DIAGNOSIS — Z136 Encounter for screening for cardiovascular disorders: Secondary | ICD-10-CM | POA: Diagnosis not present

## 2021-12-25 DIAGNOSIS — Z79899 Other long term (current) drug therapy: Secondary | ICD-10-CM

## 2021-12-25 DIAGNOSIS — Z0001 Encounter for general adult medical examination with abnormal findings: Secondary | ICD-10-CM

## 2021-12-25 DIAGNOSIS — G4700 Insomnia, unspecified: Secondary | ICD-10-CM

## 2021-12-25 DIAGNOSIS — M5136 Other intervertebral disc degeneration, lumbar region: Secondary | ICD-10-CM

## 2021-12-25 DIAGNOSIS — D692 Other nonthrombocytopenic purpura: Secondary | ICD-10-CM

## 2021-12-25 DIAGNOSIS — Z0000 Encounter for general adult medical examination without abnormal findings: Secondary | ICD-10-CM

## 2021-12-25 DIAGNOSIS — E559 Vitamin D deficiency, unspecified: Secondary | ICD-10-CM | POA: Diagnosis not present

## 2021-12-25 NOTE — Patient Instructions (Signed)
Check blood pressure and if it is consistently greater than 130/80 please notify the office   Hypertension, Adult Hypertension is another name for high blood pressure. High blood pressure forces your heart to work harder to pump blood. This can cause problems over time. There are two numbers in a blood pressure reading. There is a top number (systolic) over a bottom number (diastolic). It is best to have a blood pressure that is below 120/80. What are the causes? The cause of this condition is not known. Some other conditions can lead to high blood pressure. What increases the risk? Some lifestyle factors can make you more likely to develop high blood pressure: Smoking. Not getting enough exercise or physical activity. Being overweight. Having too much fat, sugar, calories, or salt (sodium) in your diet. Drinking too much alcohol. Other risk factors include: Having any of these conditions: Heart disease. Diabetes. High cholesterol. Kidney disease. Obstructive sleep apnea. Having a family history of high blood pressure and high cholesterol. Age. The risk increases with age. Stress. What are the signs or symptoms? High blood pressure may not cause symptoms. Very high blood pressure (hypertensive crisis) may cause: Headache. Fast or uneven heartbeats (palpitations). Shortness of breath. Nosebleed. Vomiting or feeling like you may vomit (nauseous). Changes in how you see. Very bad chest pain. Feeling dizzy. Seizures. How is this treated? This condition is treated by making healthy lifestyle changes, such as: Eating healthy foods. Exercising more. Drinking less alcohol. Your doctor may prescribe medicine if lifestyle changes do not help enough and if: Your top number is above 130. Your bottom number is above 80. Your personal target blood pressure may vary. Follow these instructions at home: Eating and drinking  If told, follow the DASH eating plan. To follow this  plan: Fill one half of your plate at each meal with fruits and vegetables. Fill one fourth of your plate at each meal with whole grains. Whole grains include whole-wheat pasta, brown rice, and whole-grain bread. Eat or drink low-fat dairy products, such as skim milk or low-fat yogurt. Fill one fourth of your plate at each meal with low-fat (lean) proteins. Low-fat proteins include fish, chicken without skin, eggs, beans, and tofu. Avoid fatty meat, cured and processed meat, or chicken with skin. Avoid pre-made or processed food. Limit the amount of salt in your diet to less than 1,500 mg each day. Do not drink alcohol if: Your doctor tells you not to drink. You are pregnant, may be pregnant, or are planning to become pregnant. If you drink alcohol: Limit how much you have to: 0-1 drink a day for women. 0-2 drinks a day for men. Know how much alcohol is in your drink. In the [place] one drink equals one 12 oz bottle of beer (355 mL), one 5 oz glass of wine (148 mL), or one 1 oz glass of hard liquor (44 mL). Lifestyle  Work with your doctor to stay at a healthy weight or to lose weight. Ask your doctor what the best weight is for you. Get at least 30 minutes of exercise that causes your heart to beat faster (aerobic exercise) most days of the week. This may include walking, swimming, or biking. Get at least 30 minutes of exercise that strengthens your muscles (resistance exercise) at least 3 days a week. This may include lifting weights or doing Pilates. Do not smoke or use any products that contain nicotine or tobacco. If you need help quitting, ask your doctor. Check your blood pressure  at home as told by your doctor. Keep all follow-up visits. Medicines Take over-the-counter and prescription medicines only as told by your doctor. Follow directions carefully. Do not skip doses of blood pressure medicine. The medicine does not work as well if you skip doses. Skipping doses also puts you at  risk for problems. Ask your doctor about side effects or reactions to medicines that you should watch for. Contact a doctor if: You think you are having a reaction to the medicine you are taking. You have headaches that keep coming back. You feel dizzy. You have swelling in your ankles. You have trouble with your vision. Get help right away if: You get a very bad headache. You start to feel mixed up (confused). You feel weak or numb. You feel faint. You have very bad pain in your: Chest. Belly (abdomen). You vomit more than once. You have trouble breathing. These symptoms may be an emergency. Get help right away. Call 911. Do not wait to see if the symptoms will go away. Do not drive yourself to the hospital. Summary Hypertension is another name for high blood pressure. High blood pressure forces your heart to work harder to pump blood. For most people, a normal blood pressure is less than 120/80. Making healthy choices can help lower blood pressure. If your blood pressure does not get lower with healthy choices, you may need to take medicine. This information is not intended to replace advice given to you by your health care provider. Make sure you discuss any questions you have with your health care provider. Document Revised: 10/10/2020 Document Reviewed: 10/10/2020 Elsevier Patient Education  [place].

## 2021-12-27 LAB — MICROALBUMIN / CREATININE URINE RATIO
Creatinine, Urine: 81 mg/dL (ref 20–275)
Microalb Creat Ratio: 17 mcg/mg creat (ref <30)
Microalb, Ur: 1.4 mg/dL

## 2021-12-27 LAB — COMPLETE METABOLIC PANEL WITH GFR
AG Ratio: 1.5 (calc) (ref 1.0–2.5)
ALT: 8 U/L (ref 6–29)
AST: 19 U/L (ref 10–35)
Albumin: 4.2 g/dL (ref 3.6–5.1)
Alkaline phosphatase (APISO): 78 U/L (ref 37–153)
BUN/Creatinine Ratio: 15 (calc) (ref 6–22)
BUN: 16 mg/dL (ref 7–25)
CO2: 27 mmol/L (ref 20–32)
Calcium: 9.8 mg/dL (ref 8.6–10.4)
Chloride: 104 mmol/L (ref 98–110)
Creat: 1.05 mg/dL — ABNORMAL HIGH (ref 0.60–1.00)
Globulin: 2.8 g/dL (calc) (ref 1.9–3.7)
Glucose, Bld: 92 mg/dL (ref 65–99)
Potassium: 3.9 mmol/L (ref 3.5–5.3)
Sodium: 142 mmol/L (ref 135–146)
Total Bilirubin: 0.8 mg/dL (ref 0.2–1.2)
Total Protein: 7 g/dL (ref 6.1–8.1)
eGFR: 54 mL/min/{1.73_m2} — ABNORMAL LOW (ref >=60)

## 2021-12-27 LAB — CBC WITH DIFFERENTIAL/PLATELET
Absolute Monocytes: 559 cells/uL (ref 200–950)
Basophils Absolute: 17 cells/uL (ref 0–200)
Basophils Relative: 0.2 %
Eosinophils Absolute: 52 cells/uL (ref 15–500)
Eosinophils Relative: 0.6 %
HCT: 41.1 % (ref 35.0–45.0)
Hemoglobin: 13.4 g/dL (ref 11.7–15.5)
Lymphs Abs: 1849 cells/uL (ref 850–3900)
MCH: 28 pg (ref 27.0–33.0)
MCHC: 32.6 g/dL (ref 32.0–36.0)
MCV: 86 fL (ref 80.0–100.0)
MPV: 10.9 fL (ref 7.5–12.5)
Monocytes Relative: 6.5 %
Neutro Abs: 6123 cells/uL (ref 1500–7800)
Neutrophils Relative %: 71.2 %
Platelets: 199 10*3/uL (ref 140–400)
RBC: 4.78 10*6/uL (ref 3.80–5.10)
RDW: 13.6 % (ref 11.0–15.0)
Total Lymphocyte: 21.5 %
WBC: 8.6 10*3/uL (ref 3.8–10.8)

## 2021-12-27 LAB — LIPID PANEL
Cholesterol: 305 mg/dL — ABNORMAL HIGH (ref <200)
HDL: 65 mg/dL (ref >=50)
LDL Cholesterol (Calc): 215 mg/dL (calc) — ABNORMAL HIGH
Non-HDL Cholesterol (Calc): 240 mg/dL (calc) — ABNORMAL HIGH (ref <130)
Total CHOL/HDL Ratio: 4.7 (calc) (ref <5.0)
Triglycerides: 117 mg/dL (ref <150)

## 2021-12-27 LAB — URINALYSIS W MICROSCOPIC + REFLEX CULTURE
Bacteria, UA: NONE SEEN /HPF
Bilirubin Urine: NEGATIVE
Glucose, UA: NEGATIVE
Hyaline Cast: NONE SEEN /LPF
Ketones, ur: NEGATIVE
Nitrites, Initial: NEGATIVE
Protein, ur: NEGATIVE
RBC / HPF: NONE SEEN /HPF (ref 0–2)
Specific Gravity, Urine: 1.012 (ref 1.001–1.035)
pH: 6.5 (ref 5.0–8.0)

## 2021-12-27 LAB — HEMOGLOBIN A1C
Hgb A1c MFr Bld: 5.7 % of total Hgb — ABNORMAL HIGH (ref <5.7)
Mean Plasma Glucose: 117 mg/dL
eAG (mmol/L): 6.5 mmol/L

## 2021-12-27 LAB — CULTURE INDICATED

## 2021-12-27 LAB — VITAMIN D 25 HYDROXY (VIT D DEFICIENCY, FRACTURES): Vit D, 25-Hydroxy: 36 ng/mL (ref 30–100)

## 2021-12-27 LAB — MAGNESIUM: Magnesium: 2 mg/dL (ref 1.5–2.5)

## 2021-12-27 LAB — URINE CULTURE
MICRO NUMBER:: 14347566
Result:: NO GROWTH
SPECIMEN QUALITY:: ADEQUATE

## 2021-12-27 LAB — TSH: TSH: 3.65 mIU/L (ref 0.40–4.50)

## 2022-01-07 ENCOUNTER — Telehealth: Payer: Self-pay | Admitting: Nurse Practitioner

## 2022-01-07 ENCOUNTER — Other Ambulatory Visit: Payer: Self-pay

## 2022-01-07 DIAGNOSIS — E782 Mixed hyperlipidemia: Secondary | ICD-10-CM

## 2022-01-07 MED ORDER — ROSUVASTATIN CALCIUM 40 MG PO TABS: ORAL_TABLET | ORAL | 3 refills | Status: DC

## 2022-01-07 NOTE — Telephone Encounter (Signed)
Requesting a refill on rosuvastatin to go to [facility]

## 2022-03-03 ENCOUNTER — Ambulatory Visit: Payer: Medicare HMO | Admitting: Nurse Practitioner

## 2022-03-04 ENCOUNTER — Other Ambulatory Visit: Payer: Self-pay | Admitting: Nurse Practitioner

## 2022-03-04 DIAGNOSIS — I10 Essential (primary) hypertension: Secondary | ICD-10-CM

## 2022-03-16 ENCOUNTER — Other Ambulatory Visit: Payer: Self-pay | Admitting: Nurse Practitioner

## 2022-03-16 DIAGNOSIS — E039 Hypothyroidism, unspecified: Secondary | ICD-10-CM

## 2022-03-26 ENCOUNTER — Telehealth: Payer: Self-pay | Admitting: *Deleted

## 2022-03-26 NOTE — Progress Notes (Signed)
  Care Coordination   Note   03/26/2022 Name: Stacy Wallace MRN: [id number] DOB: Feb 12, 1945  Stacy Wallace is a 77 y.o. year old female who sees Unk Pinto, MD for primary care. I reached out to Marko Plume by phone today to offer care coordination services.  Ms. Odegaard was given information about Care Coordination services today including:   The Care Coordination services include support from the care team which includes your Nurse Coordinator, Clinical Social Worker, or Pharmacist.  The Care Coordination team is here to help remove barriers to the health concerns and goals most important to you. Care Coordination services are voluntary, and the patient may decline or stop services at any time by request to their care team member.   Care Coordination Consent Status: Patient spouse Stacy Wallace did not agree to participate in care coordination services at this time.    Encounter Outcome:  Pt. Refused  [place]  Direct Dial: [phone]

## 2022-03-31 NOTE — Progress Notes (Deleted)
Patient ID: Stacy Wallace, female   DOB: Jul 10, 1945, 77 y.o.   MRN: [id number]  MEDICARE ANNUAL WELLNESS VISIT AND FOLLOW UP  Assessment:   Encounter for Medicare annual wellness exam Due annually  Send covid 19 vaccine information  Aortic atherosclerosis ([place]) Control blood pressure, cholesterol, glucose, increase exercise.   Senile purpura ([place]) Reassured, protect skin  Essential hypertension - Restart Olmesartan - DASH diet, exercise and monitor at home. Call if greater than 140/90.  -     CBC with Differential/Platelet -     CMP/GFR -     TSH  Alzheimer disease Continue follow up with neuro Exercise at least 20-30 mins a day Has been off of namenda/aricept; declines refill to restart at this time;  Encouraged daily log to remember med compliance, fluid intake, daily walking Daughter will help monitor   Hypothyroidism, unspecified type -check TSH level, restart medicaion, reminded to take on an empty stomach 30-52mins before food.  -     TSH  Mixed hyperlipidemia -restart medications, check lipids, decrease fatty foods, increase activity.  -     Lipid panel  Prediabetes Discussed disease progression and risks Discussed diet/exercise, weight management and risk modification  - A1C   Medication management -     Magnesium  Vitamin D deficiency Defer check, restart supplement   Insomnia, unspecified type - good sleep hygiene discussed, increase day time activity  DDD (degenerative disc disease), lumbar Increase walking; tylenol PRN  Gastroesophageal reflux disease, esophagitis presence not specified Off of meds; denies sx  BMI 24 Monitor   CKD 3 (HCC) Increase fluids, avoid NSAIDS, monitor sugars, will monitor Keep log to track fluid intake - CMP/GFR   Risk non-compliance with medications Sent in 1 year supply to mail order pharmacy(reviewed again with husband that medications have refills, once current prescription is gone call pharmacy for  refill) Keep daily log of meds if taken Daughter will help monitor  Over 30 minutes of exam, counseling, chart review, and critical decision making was performed  Future Appointments  Date Time Provider [place]  04/01/2022  2:30 PM Alycia Rossetti, NP [facility] None  12/31/2022  2:00 PM Alycia Rossetti, NP [facility] None  07/05/2023  2:00 PM Alycia Rossetti, NP [facility] None    Plan:   During the course of the visit the patient was educated and counseled about appropriate screening and preventive services including:   Pneumococcal vaccine  Influenza vaccine Td vaccine Prevnar 13 Screening electrocardiogram Screening mammography Bone densitometry screening Colorectal cancer screening Diabetes screening Glaucoma screening Nutrition counseling  Advanced directives: given info/requested copies   Subjective:   Stacy Wallace is a 77 y.o. female who presents for Medicare Annual Wellness Visit and 3 month follow up on hypertension, prediabetes, hyperlipidemia, vitamin D def.   She has Alzheimer's, followed by Dr. Rexene Alberts; was on namenda/aricept;  She lives with husband in apartment, family checks on her, no longer driving. No falls, no wandering or concern with safety. Husband present with patient today. Apparently she ran out of medications, typically mail order, has been off of all meds for 2 weeks patient never contacted mail order pharmacy or office. She demonstrates very poor judgement. Daughter notes her memory has been much worse since off of thyroid medication-  [place]  BMI is There is no height or weight on file to calculate BMI., she has not been working on diet and exercise. Wt Readings from Last 3 Encounters:  12/25/21 117 lb 3.2  oz (53.2 kg)  09/24/21 116 lb 9.6 oz (52.9 kg)  06/24/21 119 lb (54 kg)   She has not been checking BPs at home, does have cuff, daughter can help, has been off of olmesartan 40 mg due to running out of  medication, today their BP is    BP Readings from Last 3 Encounters:  12/25/21 (!) 158/88  09/24/21 138/78  06/24/21 (!) 160/94     She does not workout. She denies chest pain, shortness of breath, dizziness.   She has aortic atherosclerosis per xray 12/2016  She is on cholesterol medication and denies myalgias. Her cholesterol is at goal. The cholesterol last visit was:   Lab Results  Component Value Date   CHOL 305 (H) 12/25/2021   HDL 65 12/25/2021   LDLCALC 215 (H) 12/25/2021   TRIG 117 12/25/2021   CHOLHDL 4.7 12/25/2021   Patient reports that she has not been monitoring her diet.  She has a history of prediabetes which is diet controlled.  She has not symptoms of polydipsia, polyphagia, or polyuria.   Lab Results  Component Value Date   HGBA1C 5.7 (H) 12/25/2021     She has CKD III monitored at this office.  Lab Results  Component Value Date   EGFR 54 (L) 12/25/2021    Lab Results  Component Value Date   GFRNONAA 53 (L) 02/29/2020   GFRNONAA 48 (L) 08/10/2019   GFRNONAA 40 (L) 02/07/2019   Patient is on Vitamin D supplement. Lab Results  Component Value Date   VD25OH 36 12/25/2021     BMI is There is no height or weight on file to calculate BMI., she is working on diet and exercise. Wt Readings from Last 3 Encounters:  12/25/21 117 lb 3.2 oz (53.2 kg)  09/24/21 116 lb 9.6 oz (52.9 kg)  06/24/21 119 lb (54 kg)    Medication Review Current Outpatient Medications on File Prior to Visit  Medication Sig Dispense Refill   levothyroxine (SYNTHROID) 50 MCG tablet TAKE 1 TABLET EVERY DAY BEFORE BREAKFAST 90 tablet 3   olmesartan (BENICAR) 40 MG tablet TAKE 1 TABLET EVERY DAY FOR BLOOD PRESSURE 90 tablet 3   rosuvastatin (CRESTOR) 40 MG tablet Take 1 tablet Daily for Cholesterol 90 tablet 3   zinc gluconate 50 MG tablet Take 50 mg by mouth daily. PRN     No current facility-administered medications on file prior to visit.    Allergies: Allergies  Allergen  Reactions   Lopid [Gemfibrozil]     headache   Morphine And Related Other (See Comments)    Reaction: unknown    Nickel Other (See Comments)    Reaction: unknown    Xanax [Alprazolam]    Latex Rash    Current Problems (verified) has Hyperlipidemia, mixed; Hypertension; Hypothyroidism; Abnormal glucose; Vitamin D deficiency; GERD (gastroesophageal reflux disease); Medication management; Insomnia; DDD (degenerative disc disease), lumbar; Aortic atherosclerosis ([place]); Alzheimer disease ([place]); CKD stage G3b/A2, GFR 30-44 and albumin creatinine ratio 30-299 mg/g (HCC); BMI 24.0-24.9, adult; Senile purpura ([place]); and Arm skin lesion, left on their problem list.  Screening Tests Immunization History  Administered Date(s) Administered   Influenza, High Dose Seasonal PF 09/19/2013, 10/16/2014, 10/08/2015, 11/30/2016, 10/06/2021   Influenza-Unspecified 09/15/2012, 11/05/2016, 11/05/2017, 10/06/2019, 11/13/2020   Pneumococcal Conjugate-13 08/08/2015   Pneumococcal-Unspecified 03/18/2010   Td 11/09/2006    Preventative care: Last colonoscopy: 2008 normal, willing to do cologuard Cologuard: 03/2018 due this year Last mammogram: 12/2017, would not pursue treatment DEXA: 2013  Left hip Xray 03/2013 MR spine 09/2015  Prior vaccinations: TD or Tdap: 2008 declines  Influenza: 10/2019 Pneumococcal: 2012 Prevnar13: 2017 Shingles/Zostavax: Declined   Names of Other Physician/Practitioners you currently use: 1. [facility]- here for primary care 2. Dr. Katy Fitch, eye doctor,3 years ago readers at home 3. New dentist in [place] - Dr. Lannie Fields, dentist, last 2022  Patient Care Team: Unk Pinto, MD as PCP - General (Internal Medicine)  Surgical: She  has a past surgical history that includes Tubal ligation; Eye surgery; Abdominal hysterectomy; Carpal tunnel release (Bilateral, 2001); and Nasal septum surgery. Family Her family history includes  Alcohol abuse in her father; Depression in her sister; Heart disease in her brother, father, and sister; Hyperlipidemia in her father and mother; Hypertension in her father and mother; Stroke in her sister. Social history  She reports that she quit smoking about 34 years ago. Her smoking use included cigarettes. She has never used smokeless tobacco. She reports current alcohol use. No history on file for drug use.  MEDICARE WELLNESS OBJECTIVES: Physical activity:   Cardiac risk factors:   Depression/mood screen:      09/24/2021   12:49 AM  Depression screen PHQ 2/9  Decreased Interest 0  Down, Depressed, Hopeless 0  PHQ - 2 Score 0    ADLs:     09/24/2021   12:50 AM  In your present state of health, do you have any difficulty performing the following activities:  Hearing? 0  Vision? 0  Difficulty concentrating or making decisions? 0  Walking or climbing stairs? 0  Dressing or bathing? 0  Doing errands, shopping? 0      Cognitive Testing  Alert? Yes  Normal Appearance?Yes  Oriented to person? Yes  Place? Yes   Time? No got month wrong  Recall of three objects?  1/3  Can perform simple calculations? No  Displays appropriate judgment? No  Can read the correct time from a watch face?Yes  EOL planning:     Objective:   There were no vitals filed for this visit.   There is no height or weight on file to calculate BMI.  General appearance: alert, no distress, WD/WN,  female HEENT: normocephalic, sclerae anicteric, TMs pearly, nares patent, no discharge or erythema, pharynx normal Oral cavity: MMM, no lesions Neck: supple, no lymphadenopathy, no thyromegaly, no masses Heart: RRR, normal S1, S2, no murmurs Lungs: CTA bilaterally, no wheezes, rhonchi, or rales Abdomen: +bs, soft, non tender, non distended, no masses, no hepatomegaly, no splenomegaly Musculoskeletal: nontender, no swelling, no obvious deformity, no SI tenderness, neg straight leg.  Extremities: no edema, no  cyanosis, no clubbing Pulses: 2+ symmetric, upper and lower extremities, normal cap refill Neurological: alert, oriented x 3, CN2-12 intact, strength normal upper extremities and lower extremities, sensation normal throughout, DTRs 2+ throughout, no cerebellar signs, gait normal. Scattered thought process, poor short term recall Psychiatric: normal affect, behavior normal, pleasant  Derm: warm/dry intact; scattered small ecchymoses to bil uppper extremities; left forearm with erythematous/scaly irregular area approx 2 cm x 2 cm       Medicare Attestation I have personally reviewed: The patient's medical and social history Their use of alcohol, tobacco or illicit drugs Their current medications and supplements The patient's functional ability including ADLs,fall risks, home safety risks, cognitive, and hearing and visual impairment Diet and physical activities Evidence for depression or mood disorders  The patient's weight, height, BMI, and visual acuity have been recorded in the chart.  I  have made referrals, counseling, and provided education to the patient based on review of the above and I have provided the patient with a written personalized care plan for preventive services.     Alycia Rossetti, NP   03/31/2022

## 2022-04-01 ENCOUNTER — Telehealth: Payer: Self-pay | Admitting: Nurse Practitioner

## 2022-04-01 ENCOUNTER — Ambulatory Visit: Payer: Medicare HMO | Admitting: Nurse Practitioner

## 2022-04-01 DIAGNOSIS — Z9189 Other specified personal risk factors, not elsewhere classified: Secondary | ICD-10-CM

## 2022-04-01 DIAGNOSIS — I10 Essential (primary) hypertension: Secondary | ICD-10-CM

## 2022-04-01 DIAGNOSIS — N1832 Chronic kidney disease, stage 3b: Secondary | ICD-10-CM

## 2022-04-01 DIAGNOSIS — I700 Atherosclerosis of aorta: Secondary | ICD-10-CM

## 2022-04-01 DIAGNOSIS — E782 Mixed hyperlipidemia: Secondary | ICD-10-CM

## 2022-04-01 DIAGNOSIS — R7309 Other abnormal glucose: Secondary | ICD-10-CM

## 2022-04-01 DIAGNOSIS — M5136 Other intervertebral disc degeneration, lumbar region: Secondary | ICD-10-CM

## 2022-04-01 DIAGNOSIS — G4700 Insomnia, unspecified: Secondary | ICD-10-CM

## 2022-04-01 DIAGNOSIS — Z6824 Body mass index (BMI) 24.0-24.9, adult: Secondary | ICD-10-CM

## 2022-04-01 DIAGNOSIS — K219 Gastro-esophageal reflux disease without esophagitis: Secondary | ICD-10-CM

## 2022-04-01 DIAGNOSIS — E559 Vitamin D deficiency, unspecified: Secondary | ICD-10-CM

## 2022-04-01 DIAGNOSIS — F0280 Dementia in other diseases classified elsewhere without behavioral disturbance: Secondary | ICD-10-CM

## 2022-04-01 DIAGNOSIS — Z79899 Other long term (current) drug therapy: Secondary | ICD-10-CM

## 2022-04-01 DIAGNOSIS — D692 Other nonthrombocytopenic purpura: Secondary | ICD-10-CM

## 2022-04-01 DIAGNOSIS — Z0000 Encounter for general adult medical examination without abnormal findings: Secondary | ICD-10-CM

## 2022-04-01 DIAGNOSIS — E039 Hypothyroidism, unspecified: Secondary | ICD-10-CM

## 2022-04-01 MED ORDER — ROSUVASTATIN CALCIUM 40 MG PO TABS: ORAL_TABLET | ORAL | 3 refills | Status: DC

## 2022-04-01 NOTE — Addendum Note (Signed)
Addended by: Chancy Hurter on: 04/01/2022 04:33 PM   Modules accepted: Orders

## 2022-04-01 NOTE — Telephone Encounter (Signed)
PT NEED A REFILL ON THEIR CRESTOR TO PHARM [facility] AT [address]-THEY HAD AN APPT TODAY WITH DANA AND OF COURSE DANA IS OUT TODAY. THANKS

## 2022-04-09 NOTE — Progress Notes (Signed)
Patient ID: ELZENA MUSTON, female   DOB: 1945/11/14, 77 y.o.   MRN: [id number]  MEDICARE ANNUAL WELLNESS VISIT AND FOLLOW UP  Assessment:   Encounter for Medicare annual wellness exam Due annually  Declines Mammogram and further vaccinations  Aortic atherosclerosis (HCC) Control blood pressure, cholesterol, glucose, increase exercise.   Senile purpura (HCC) Reassured, protect skin  Essential hypertension - Restart Olmesartan - DASH diet, exercise and monitor at home. Call if greater than 140/90.  -     CBC with Differential/Platelet -     CMP/GFR -     TSH  Alzheimer disease Continue follow up with neuro Exercise at least 20-30 mins a day Has been off of namenda/aricept; declines refill to restart at this time;  Encouraged daily log to remember med compliance, fluid intake, daily walking Daughter will help monitor   Hypothyroidism, unspecified type -check TSH level, restart medicaion, reminded to take on an empty stomach 30-45mins before food.  -     TSH  Mixed hyperlipidemia -restart medications, check lipids, decrease fatty foods, increase activity.  -     Lipid panel  Prediabetes Discussed disease progression and risks Discussed diet/exercise, weight management and risk modification   Medication management -     Magnesium  Vitamin D deficiency Defer check, restart supplement   Insomnia, unspecified type - good sleep hygiene discussed, increase day time activity  DDD (degenerative disc disease), lumbar Increase walking; tylenol PRN  Gastroesophageal reflux disease, esophagitis presence not specified Off of meds; denies sx - magnesium  BMI 21 Encouraged to eat high protein as well as fruits and vegetables Monitor  CKD 3 (HCC) Increase fluids, avoid NSAIDS, monitor sugars, will monitor Keep log to track fluid intake - CMP/GFR   Risk non-compliance with medications Sent in 1 year supply to mail order pharmacy(reviewed again with husband that  medications have refills, once current prescription is gone call pharmacy for refill) Keep daily log of meds if taken Daughter will help monitor  Over 30 minutes of exam, counseling, chart review, and critical decision making was performed  Future Appointments  Date Time Provider Department Center  12/31/2022  2:00 PM Raynelle Dick, NP [facility] None  03/31/2023 10:00 AM Raynelle Dick, NP [facility] None    Plan:   During the course of the visit the patient was educated and counseled about appropriate screening and preventive services including:   Pneumococcal vaccine  Influenza vaccine Td vaccine Prevnar 13 Screening electrocardiogram Screening mammography Bone densitometry screening Colorectal cancer screening Diabetes screening Glaucoma screening Nutrition counseling  Advanced directives: given info/requested copies   Subjective:   ROENA SASSAMAN is a 77 y.o. female who presents for Medicare Annual Wellness Visit and 3 month follow up on hypertension, prediabetes, hyperlipidemia, vitamin D def.   She has Alzheimer's, followed by Dr. Frances Furbish; was on namenda/aricept;  She lives with husband in apartment, family checks on her, no longer driving. No falls, no wandering or concern with safety. Husband present with patient today. Apparently she ran out of medications, typically mail order, has been off of all meds for 2 weeks patient never contacted mail order pharmacy or office. She demonstrates very poor judgement. Daughter notes her memory has been much worse since off of thyroid medication-  CALL DAUGHTER WITH RESULTS  BMI is Body mass index is 21.72 kg/m., she has not been working on diet and exercise. Does a lot of walking and is eating more fruits and vegetables.  Wt Readings from Last 3 Encounters:  04/13/22 111 lb 3.2 oz (50.4 kg)  12/25/21 117 lb 3.2 oz (53.2 kg)  09/24/21 116 lb 9.6 oz (52.9 kg)   She has not been checking BPs at home, does have cuff,  daughter can help, has been off of olmesartan 40 mg due to running out of medication, today their BP is BP: 128/64  BP Readings from Last 3 Encounters:  04/13/22 128/64  12/25/21 (!) 158/88  09/24/21 138/78  She does not workout. She denies chest pain, shortness of breath, dizziness.   She has aortic atherosclerosis per xray 12/2016  She is on cholesterol medication, Rosuvastatin 40 mg and denies myalgias. Her cholesterol is at goal. The cholesterol last visit was:   Lab Results  Component Value Date   CHOL 305 (H) 12/25/2021   HDL 65 12/25/2021   LDLCALC 215 (H) 12/25/2021   TRIG 117 12/25/2021   CHOLHDL 4.7 12/25/2021   Patient reports that she has not been monitoring her diet.  She has a history of prediabetes which is diet controlled.  She has not symptoms of polydipsia, polyphagia, or polyuria.   Lab Results  Component Value Date   HGBA1C 5.7 (H) 12/25/2021    She has CKD III monitored at this office.  Lab Results  Component Value Date   EGFR 54 (L) 12/25/2021     Patient is on Vitamin D supplement. Lab Results  Component Value Date   VD25OH 36 12/25/2021        Medication Review Current Outpatient Medications on File Prior to Visit  Medication Sig Dispense Refill   levothyroxine (SYNTHROID) 50 MCG tablet TAKE 1 TABLET EVERY DAY BEFORE BREAKFAST 90 tablet 3   olmesartan (BENICAR) 40 MG tablet TAKE 1 TABLET EVERY DAY FOR BLOOD PRESSURE 90 tablet 3   rosuvastatin (CRESTOR) 40 MG tablet Take 1 tablet Daily for Cholesterol 90 tablet 3   zinc gluconate 50 MG tablet Take 50 mg by mouth daily. PRN     No current facility-administered medications on file prior to visit.    Allergies: Allergies  Allergen Reactions   Lopid [Gemfibrozil]     headache   Morphine And Related Other (See Comments)    Reaction: unknown    Nickel Other (See Comments)    Reaction: unknown    Xanax [Alprazolam]    Latex Rash    Current Problems (verified) has Hyperlipidemia, mixed;  Hypertension; Hypothyroidism; Abnormal glucose; Vitamin D deficiency; GERD (gastroesophageal reflux disease); Medication management; Insomnia; DDD (degenerative disc disease), lumbar; Aortic atherosclerosis; Alzheimer disease; CKD stage G3b/A2, GFR 30-44 and albumin creatinine ratio 30-299 mg/g; BMI 24.0-24.9, adult; Senile purpura; and Arm skin lesion, left on their problem list.  Screening Tests Immunization History  Administered Date(s) Administered   Influenza, High Dose Seasonal PF 09/19/2013, 10/16/2014, 10/08/2015, 11/30/2016, 10/06/2021   Influenza-Unspecified 09/15/2012, 11/05/2016, 11/05/2017, 10/06/2019, 11/13/2020   Pneumococcal Conjugate-13 08/08/2015   Pneumococcal-Unspecified 03/18/2010   Td 11/09/2006   Health Maintenance  Topic Date Due   COVID-19 Vaccine (1) 04/29/2022 (Originally 03/03/1950)   Zoster Vaccines- Shingrix (1 of 2) 07/13/2022 (Originally 03/03/1962)   Pneumonia Vaccine 92+ Years old (2 of 2 - PPSV23 or PCV20) 04/13/2023 (Originally 08/07/2016)   INFLUENZA VACCINE  08/06/2022   Medicare Annual Wellness (AWV)  04/13/2023   DEXA SCAN  Completed   HPV VACCINES  Aged Out   DTaP/Tdap/Td  Discontinued   COLONOSCOPY (Pts 45-47yrs Insurance coverage will need to be confirmed)  Discontinued   Hepatitis C Screening  Discontinued   Fecal DNA (  Cologuard)  Discontinued     Names of Other Physician/Practitioners you currently use: 1. [facility]- here for primary care 2. Dr. Dione Booze, eye doctor,3 years ago readers at home 3. New dentist in [place] - Dr. Jaci Standard, dentist, last 2022  Patient Care Team: Lucky Cowboy, MD as PCP - General (Internal Medicine)  Surgical: She  has a past surgical history that includes Tubal ligation; Eye surgery; Abdominal hysterectomy; Carpal tunnel release (Bilateral, 2001); and Nasal septum surgery. Family Her family history includes Alcohol abuse in her father; Depression in her sister; Heart  disease in her brother, father, and sister; Hyperlipidemia in her father and mother; Hypertension in her father and mother; Stroke in her sister. Social history  She reports that she quit smoking about 34 years ago. Her smoking use included cigarettes. She has never used smokeless tobacco. She reports current alcohol use. No history on file for drug use.  MEDICARE WELLNESS OBJECTIVES: Physical activity: Current Exercise Habits: Home exercise routine, Type of exercise: walking, Time (Minutes): 45, Frequency (Times/Week): 7, Weekly Exercise (Minutes/Week): 315, Intensity: Mild, Exercise limited by: psychological condition(s) Cardiac risk factors: Cardiac Risk Factors include: advanced age (>72men, >2 women);dyslipidemia;hypertension Depression/mood screen:      04/13/2022   10:54 AM  Depression screen PHQ 2/9  Decreased Interest 0  Down, Depressed, Hopeless 0  PHQ - 2 Score 0    ADLs:     04/13/2022   10:54 AM 09/24/2021   12:50 AM  In your present state of health, do you have any difficulty performing the following activities:  Hearing? 0 0  Vision? 0 0  Difficulty concentrating or making decisions? 1 0  Walking or climbing stairs?  0  Dressing or bathing? 0 0  Doing errands, shopping? 1 0  Preparing Food and eating ? Y   Using the Toilet? N   In the past six months, have you accidently leaked urine? N   Do you have problems with loss of bowel control? N   Managing your Medications? Y   Managing your Finances? Y   Housekeeping or managing your Housekeeping? N       Cognitive Testing  Alert? Yes  Normal Appearance?Yes  Oriented to person? Yes  Place? Yes   Time? No got month wrong  Recall of three objects?  0/3  Can perform simple calculations? No  Displays appropriate judgment? No  Can read the correct time from a watch face?No  EOL planning: Does Patient Have a Medical Advance Directive?: Yes Type of Advance Directive: Healthcare Power of Attorney, Living will Does  patient want to make changes to medical advance directive?: No - Patient declined Copy of Healthcare Power of Attorney in Chart?: No - copy requested     04/13/2022   10:56 AM 12/25/2021    2:13 PM 03/03/2021    3:56 PM  MMSE - Mini Mental State Exam  Orientation to time 1 0 3  Orientation to Place 4 2 5   Registration 0 0 3  Attention/ Calculation 0 2 3  Recall 0 0 1  Language- name 2 objects 2 2 2   Language- repeat 0 0 1  Language- follow 3 step command 3 3 3   Language- read & follow direction 1 1 1   Write a sentence 0 0 1  Copy design 0 0 0  Total score 11 10 23      Objective:   Today's Vitals   04/13/22 1022  BP: 128/64  Pulse: 76  Temp: Marland Kitchen)  97.5 F (36.4 C)  SpO2: 94%  Weight: 111 lb 3.2 oz (50.4 kg)  Height: 5' (1.524 m)     Body mass index is 21.72 kg/m.  General appearance: alert, no distress, WD/WN,  female HEENT: normocephalic, sclerae anicteric, TMs pearly, nares patent, no discharge or erythema, pharynx normal Oral cavity: MMM, no lesions Neck: supple, no lymphadenopathy, no thyromegaly, no masses Heart: RRR, normal S1, S2, no murmurs Lungs: CTA bilaterally, no wheezes, rhonchi, or rales Abdomen: +bs, soft, non tender, non distended, no masses, no hepatomegaly, no splenomegaly Musculoskeletal: nontender, no swelling, no obvious deformity, no SI tenderness, neg straight leg.  Extremities: no edema, no cyanosis, no clubbing Pulses: 2+ symmetric, upper and lower extremities, normal cap refill Neurological: alert, oriented x 3, CN2-12 intact, strength normal upper extremities and lower extremities, sensation normal throughout, DTRs 2+ throughout, no cerebellar signs, gait normal. Scattered thought process, poor short term recall Psychiatric: normal affect, behavior normal, pleasant  Derm: warm/dry intact; scattered small ecchymoses to bil uppper extremities      Medicare Attestation I have personally reviewed: The patient's medical and social  history Their use of alcohol, tobacco or illicit drugs Their current medications and supplements The patient's functional ability including ADLs,fall risks, home safety risks, cognitive, and hearing and visual impairment Diet and physical activities Evidence for depression or mood disorders  The patient's weight, height, BMI, and visual acuity have been recorded in the chart.  I have made referrals, counseling, and provided education to the patient based on review of the above and I have provided the patient with a written personalized care plan for preventive services.     Raynelle Dick, NP   04/13/2022

## 2022-04-13 ENCOUNTER — Ambulatory Visit (INDEPENDENT_AMBULATORY_CARE_PROVIDER_SITE_OTHER): Payer: Medicare HMO | Admitting: Nurse Practitioner

## 2022-04-13 ENCOUNTER — Encounter: Payer: Self-pay | Admitting: Nurse Practitioner

## 2022-04-13 VITALS — BP 128/64 | HR 76 | Temp 97.5°F | Ht 60.0 in | Wt 111.2 lb

## 2022-04-13 DIAGNOSIS — Z79899 Other long term (current) drug therapy: Secondary | ICD-10-CM

## 2022-04-13 DIAGNOSIS — R6889 Other general symptoms and signs: Secondary | ICD-10-CM

## 2022-04-13 DIAGNOSIS — E559 Vitamin D deficiency, unspecified: Secondary | ICD-10-CM | POA: Diagnosis not present

## 2022-04-13 DIAGNOSIS — Z0001 Encounter for general adult medical examination with abnormal findings: Secondary | ICD-10-CM | POA: Diagnosis not present

## 2022-04-13 DIAGNOSIS — K219 Gastro-esophageal reflux disease without esophagitis: Secondary | ICD-10-CM

## 2022-04-13 DIAGNOSIS — I10 Essential (primary) hypertension: Secondary | ICD-10-CM | POA: Diagnosis not present

## 2022-04-13 DIAGNOSIS — G4700 Insomnia, unspecified: Secondary | ICD-10-CM | POA: Diagnosis not present

## 2022-04-13 DIAGNOSIS — Z6824 Body mass index (BMI) 24.0-24.9, adult: Secondary | ICD-10-CM

## 2022-04-13 DIAGNOSIS — Z0000 Encounter for general adult medical examination without abnormal findings: Secondary | ICD-10-CM

## 2022-04-13 DIAGNOSIS — E782 Mixed hyperlipidemia: Secondary | ICD-10-CM

## 2022-04-13 DIAGNOSIS — M5136 Other intervertebral disc degeneration, lumbar region: Secondary | ICD-10-CM

## 2022-04-13 DIAGNOSIS — N1831 Chronic kidney disease, stage 3a: Secondary | ICD-10-CM | POA: Diagnosis not present

## 2022-04-13 DIAGNOSIS — R7309 Other abnormal glucose: Secondary | ICD-10-CM

## 2022-04-13 DIAGNOSIS — D692 Other nonthrombocytopenic purpura: Secondary | ICD-10-CM

## 2022-04-13 DIAGNOSIS — E039 Hypothyroidism, unspecified: Secondary | ICD-10-CM | POA: Diagnosis not present

## 2022-04-13 DIAGNOSIS — I700 Atherosclerosis of aorta: Secondary | ICD-10-CM

## 2022-04-13 DIAGNOSIS — G309 Alzheimer's disease, unspecified: Secondary | ICD-10-CM

## 2022-04-13 DIAGNOSIS — Z9189 Other specified personal risk factors, not elsewhere classified: Secondary | ICD-10-CM

## 2022-04-13 DIAGNOSIS — F0280 Dementia in other diseases classified elsewhere without behavioral disturbance: Secondary | ICD-10-CM

## 2022-04-13 DIAGNOSIS — M51369 Other intervertebral disc degeneration, lumbar region without mention of lumbar back pain or lower extremity pain: Secondary | ICD-10-CM

## 2022-04-13 NOTE — Patient Instructions (Signed)

## 2022-04-14 LAB — COMPLETE METABOLIC PANEL WITH GFR
AG Ratio: 1.8 (calc) (ref 1.0–2.5)
ALT: 12 U/L (ref 6–29)
AST: 18 U/L (ref 10–35)
Albumin: 4.3 g/dL (ref 3.6–5.1)
Alkaline phosphatase (APISO): 89 U/L (ref 37–153)
BUN/Creatinine Ratio: 12 (calc) (ref 6–22)
BUN: 18 mg/dL (ref 7–25)
CO2: 26 mmol/L (ref 20–32)
Calcium: 9.4 mg/dL (ref 8.6–10.4)
Chloride: 110 mmol/L (ref 98–110)
Creat: 1.51 mg/dL — ABNORMAL HIGH (ref 0.60–1.00)
Globulin: 2.4 g/dL (calc) (ref 1.9–3.7)
Glucose, Bld: 92 mg/dL (ref 65–99)
Potassium: 4.2 mmol/L (ref 3.5–5.3)
Sodium: 144 mmol/L (ref 135–146)
Total Bilirubin: 0.6 mg/dL (ref 0.2–1.2)
Total Protein: 6.7 g/dL (ref 6.1–8.1)
eGFR: 35 mL/min/{1.73_m2} — ABNORMAL LOW (ref >=60)

## 2022-04-14 LAB — CBC WITH DIFFERENTIAL/PLATELET
Absolute Monocytes: 518 cells/uL (ref 200–950)
Basophils Absolute: 28 cells/uL (ref 0–200)
Basophils Relative: 0.4 %
Eosinophils Absolute: 91 cells/uL (ref 15–500)
Eosinophils Relative: 1.3 %
HCT: 41.8 % (ref 35.0–45.0)
Hemoglobin: 13.3 g/dL (ref 11.7–15.5)
Lymphs Abs: 1974 cells/uL (ref 850–3900)
MCH: 27.9 pg (ref 27.0–33.0)
MCHC: 31.8 g/dL — ABNORMAL LOW (ref 32.0–36.0)
MCV: 87.6 fL (ref 80.0–100.0)
MPV: 10.4 fL (ref 7.5–12.5)
Monocytes Relative: 7.4 %
Neutro Abs: 4389 cells/uL (ref 1500–7800)
Neutrophils Relative %: 62.7 %
Platelets: 165 10*3/uL (ref 140–400)
RBC: 4.77 10*6/uL (ref 3.80–5.10)
RDW: 15.8 % — ABNORMAL HIGH (ref 11.0–15.0)
Total Lymphocyte: 28.2 %
WBC: 7 10*3/uL (ref 3.8–10.8)

## 2022-04-14 LAB — LIPID PANEL
Cholesterol: 165 mg/dL (ref <200)
HDL: 80 mg/dL (ref >=50)
LDL Cholesterol (Calc): 67 mg/dL (calc)
Non-HDL Cholesterol (Calc): 85 mg/dL (calc) (ref <130)
Total CHOL/HDL Ratio: 2.1 (calc) (ref <5.0)
Triglycerides: 92 mg/dL (ref <150)

## 2022-04-14 LAB — MAGNESIUM: Magnesium: 2.5 mg/dL (ref 1.5–2.5)

## 2022-04-14 LAB — TSH: TSH: 0.9 mIU/L (ref 0.40–4.50)

## 2022-04-14 NOTE — Progress Notes (Signed)
LM daughter Tresa Endo [place]'s voicemail at [phone]), to have her call the office to get patient's lab results. -e welch

## 2022-07-27 ENCOUNTER — Ambulatory Visit: Payer: Medicare HMO | Admitting: Nurse Practitioner

## 2022-08-06 NOTE — Progress Notes (Signed)
Patient ID: Stacy Wallace, female   DOB: Feb 05, 1945, 77 y.o.   MRN: [id number]  3 MONTH FOLLOW UP  Assessment:    Aortic atherosclerosis (HCC) Control blood pressure, cholesterol, glucose, increase exercise.   Senile purpura (HCC) Reassured, protect skin  Essential hypertension - Restart Olmesartan, 30 day script sent to local pharmacy to begain today - DASH diet, exercise and monitor at home. Call if greater than 140/90.  -     CBC with Differential/Platelet -     CMP/GFR -     TSH  Alzheimer disease Continue follow up with neuro Symptoms are progressing rapidly- walking is difficult, sentences are nonsensical and is starting to wander. Did not know her daughter was her daughter Encouraged daily log to remember med compliance, fluid intake, daily walking Daughter will help monitor   Hypothyroidism, unspecified type -check TSH level, restart medicaion, reminded to take on an empty stomach 30-67mins before food.  -     TSH  Mixed hyperlipidemia - Continue Rosuvastatin 40 mg every day - decrease fatty foods, increase activity.  -     Lipid panel  Abnormal Glucose Discussed disease progression and risks Discussed diet/exercise, weight management and risk modification A1c's have been at goal Check CMP, CBC   Medication management -    TSH  Vitamin D deficiency Defer check, restart supplement   Insomnia, unspecified type - good sleep hygiene discussed, increase day time activity - monitor closely to keep safe and prevent wandering out of apartment   CKD 3b (HCC) Increase fluids, avoid NSAIDS, monitor sugars, will monitor Keep log to track fluid intake - CMP/GFR, CBC  Skin Lesion of forehead Refer to dermatology for evaluation  At risk for medication noncompliance Related to dementia- has been taking regularly currently with help of husband and daughter  Urine abnormality - Routine UA with reflex microscopic - Urine culture - Push fluids  Over 30 minutes  of exam, counseling, chart review, and critical decision making was performed  Future Appointments  Date Time Provider Department Center  12/31/2022  2:00 PM Raynelle Dick, NP [facility] None  03/31/2023 10:00 AM Raynelle Dick, NP [facility] None       Subjective:   Stacy Wallace is a 77 y.o. female who presents for  3 month follow up on has Hyperlipidemia, mixed; Hypertension; Hypothyroidism; Abnormal glucose; Vitamin D deficiency; GERD (gastroesophageal reflux disease); Medication management; Insomnia; DDD (degenerative disc disease), lumbar; Aortic atherosclerosis (HCC); Alzheimer disease (HCC); CKD stage G3b/A2, GFR 30-44 and albumin creatinine ratio 30-299 mg/g (HCC); BMI 24.0-24.9, adult; Senile purpura (HCC); and Arm skin lesion, left on their problem list.   She has Alzheimer's, followed by Dr. Frances Furbish; was on namenda/aricept but has been discontinued.   She lives with husband in apartment, family checks on her, no longer driving. She is much more confused.  Did not know her daughter was her daughter today.  She is wandering and is talking nonsensical sentences. She has been getting up throughout the night and sometimes wont go back to sleep. She is walking much slower.  -  CALL DAUGHTER WITH RESULTS  She has a reddened area on her forehead which has been present for 2 months.  Appeared to resolve but then returned.  She does not pick her skin.   BMI is Body mass index is 22.3 kg/m., she has been working on diet and exercise. She walks multiple times a day but has gotten slower.  She is eating regularly- daughter has noticed she  is eating more sugar and flour items such as bread Wt Readings from Last 3 Encounters:  08/10/22 114 lb 3.2 oz (51.8 kg)  04/13/22 111 lb 3.2 oz (50.4 kg)  12/25/21 117 lb 3.2 oz (53.2 kg)   She has not been checking BPs at home, does have cuff, daughter can help, today their BP is BP: 122/62  BP Readings from Last 3 Encounters:  08/10/22 122/62   04/13/22 128/64  12/25/21 (!) 158/88   She does workout. She denies chest pain, shortness of breath, dizziness.   She has aortic atherosclerosis per xray 12/2016  She is on Rosuvastatin 40 mg every day and denies myalgias.  Her last lipid was at goal Lab Results  Component Value Date   CHOL 165 04/13/2022   HDL 80 04/13/2022   LDLCALC 67 04/13/2022   TRIG 92 04/13/2022   CHOLHDL 2.1 04/13/2022   Patient reports that she has not been monitoring her diet.  She has a history of prediabetes which is diet controlled.  She has not symptoms of polydipsia, polyphagia, or polyuria.   Lab Results  Component Value Date   HGBA1C 5.7 (H) 12/25/2021   She is on Levothyroxine 50 mcg every day . Dose was not changed at last visit Last thyroid was: Lab Results  Component Value Date   TSH 0.90 04/13/2022     She has CKD III monitored at this office. She is not drinking much water throughout day Lab Results  Component Value Date   EGFR 35 (L) 04/13/2022     Patient is on Vitamin D supplement. Lab Results  Component Value Date   VD25OH 36 12/25/2021         08/10/2022   10:44 AM 04/13/2022   10:56 AM 12/25/2021    2:13 PM  MMSE - Mini Mental State Exam  Orientation to time 0 1 0  Orientation to Place 1 4 2   Registration 2 0 0  Attention/ Calculation 0 0 2  Recall 0 0 0  Language- name 2 objects 2 2 2   Language- repeat 1 0 0  Language- follow 3 step command 0 3 3  Language- read & follow direction 0 1 1  Write a sentence 0 0 0  Copy design 0 0 0  Total score 6 11 10      Medication Review Current Outpatient Medications on File Prior to Visit  Medication Sig Dispense Refill   levothyroxine (SYNTHROID) 50 MCG tablet TAKE 1 TABLET EVERY DAY BEFORE BREAKFAST 90 tablet 3   olmesartan (BENICAR) 40 MG tablet TAKE 1 TABLET EVERY DAY FOR BLOOD PRESSURE 90 tablet 3   rosuvastatin (CRESTOR) 40 MG tablet Take 1 tablet Daily for Cholesterol 90 tablet 3   zinc gluconate 50 MG tablet Take 50  mg by mouth daily. PRN (Patient not taking: Reported on 08/10/2022)     No current facility-administered medications on file prior to visit.    Allergies: Allergies  Allergen Reactions   Lopid [Gemfibrozil]     headache   Morphine And Codeine Other (See Comments)    Reaction: unknown    Nickel Other (See Comments)    Reaction: unknown    Xanax [Alprazolam]    Latex Rash    Current Problems (verified) has Hyperlipidemia, mixed; Hypertension; Hypothyroidism; Abnormal glucose; Vitamin D deficiency; GERD (gastroesophageal reflux disease); Medication management; Insomnia; DDD (degenerative disc disease), lumbar; Aortic atherosclerosis (HCC); Alzheimer disease (HCC); CKD stage G3b/A2, GFR 30-44 and albumin creatinine ratio 30-299 mg/g (HCC); BMI 24.0-24.9,  adult; Senile purpura (HCC); and Arm skin lesion, left on their problem list.    Names of Other Physician/Practitioners you currently use: 1. [facility]- here for primary care 2. Dr. Dione Booze, eye doctor,3 years ago readers at home 3. New dentist in [place] - Dr. Jaci Standard, dentist, last 2022  Patient Care Team: Lucky Cowboy, MD as PCP - General (Internal Medicine)  Surgical: She  has a past surgical history that includes Tubal ligation; Eye surgery; Abdominal hysterectomy; Carpal tunnel release (Bilateral, 2001); and Nasal septum surgery. Family Her family history includes Alcohol abuse in her father; Depression in her sister; Heart disease in her brother, father, and sister; Hyperlipidemia in her father and mother; Hypertension in her father and mother; Stroke in her sister. Social history  She reports that she quit smoking about 34 years ago. Her smoking use included cigarettes. She has never used smokeless tobacco. She reports current alcohol use. No history on file for drug use.  Objective:   Today's Vitals   08/10/22 1030  BP: 122/62  Pulse: 83  Temp: (!) 97.5 F (36.4 C)  SpO2:  97%  Weight: 114 lb 3.2 oz (51.8 kg)  Height: 5' (1.524 m)      Body mass index is 22.3 kg/m.  General appearance: alert, no distress, WD/WN,  female HEENT: normocephalic, sclerae anicteric, TMs pearly, nares patent, no discharge or erythema, pharynx normal Oral cavity: MMM, no lesions Neck: supple, no lymphadenopathy, no thyromegaly, no masses Heart: RRR, normal S1, S2, no murmurs Lungs: CTA bilaterally, no wheezes, rhonchi, or rales Abdomen: +bs, soft, non tender, non distended, no masses, no hepatomegaly, no splenomegaly Musculoskeletal: nontender, no swelling, no obvious deformity. Gait is slow and pigeon toe walk Extremities: no edema, no cyanosis, no clubbing Pulses: 2+ symmetric, upper and lower extremities, normal cap refill Neurological: alert, oriented x 3, CN2-12 intact, strength normal upper extremities and lower extremities, sensation normal throughout, DTRs 2+ throughout, no cerebellar signs, gait normal. Scattered thought process, poor short term recall Psychiatric: normal affect, behavior normal, pleasant . Not oriented to person, place or time Derm: warm/dry intact; scattered small ecchymoses to bil uppper extremities; forehead erythematous/scaly irregular area approx 1 cm x 1 cm     Raynelle Dick, NP   08/10/2022

## 2022-08-10 ENCOUNTER — Ambulatory Visit (INDEPENDENT_AMBULATORY_CARE_PROVIDER_SITE_OTHER): Payer: Medicare HMO | Admitting: Nurse Practitioner

## 2022-08-10 ENCOUNTER — Encounter: Payer: Self-pay | Admitting: Nurse Practitioner

## 2022-08-10 VITALS — BP 122/62 | HR 83 | Temp 97.5°F | Ht 60.0 in | Wt 114.2 lb

## 2022-08-10 DIAGNOSIS — E559 Vitamin D deficiency, unspecified: Secondary | ICD-10-CM

## 2022-08-10 DIAGNOSIS — R7309 Other abnormal glucose: Secondary | ICD-10-CM

## 2022-08-10 DIAGNOSIS — I700 Atherosclerosis of aorta: Secondary | ICD-10-CM | POA: Diagnosis not present

## 2022-08-10 DIAGNOSIS — E039 Hypothyroidism, unspecified: Secondary | ICD-10-CM

## 2022-08-10 DIAGNOSIS — I10 Essential (primary) hypertension: Secondary | ICD-10-CM | POA: Diagnosis not present

## 2022-08-10 DIAGNOSIS — D692 Other nonthrombocytopenic purpura: Secondary | ICD-10-CM | POA: Diagnosis not present

## 2022-08-10 DIAGNOSIS — Z9189 Other specified personal risk factors, not elsewhere classified: Secondary | ICD-10-CM

## 2022-08-10 DIAGNOSIS — Z79899 Other long term (current) drug therapy: Secondary | ICD-10-CM

## 2022-08-10 DIAGNOSIS — G309 Alzheimer's disease, unspecified: Secondary | ICD-10-CM | POA: Diagnosis not present

## 2022-08-10 DIAGNOSIS — N1831 Chronic kidney disease, stage 3a: Secondary | ICD-10-CM

## 2022-08-10 DIAGNOSIS — E782 Mixed hyperlipidemia: Secondary | ICD-10-CM | POA: Diagnosis not present

## 2022-08-10 DIAGNOSIS — N1832 Chronic kidney disease, stage 3b: Secondary | ICD-10-CM

## 2022-08-10 DIAGNOSIS — G4700 Insomnia, unspecified: Secondary | ICD-10-CM | POA: Diagnosis not present

## 2022-08-10 DIAGNOSIS — R8290 Unspecified abnormal findings in urine: Secondary | ICD-10-CM | POA: Diagnosis not present

## 2022-08-10 DIAGNOSIS — F0280 Dementia in other diseases classified elsewhere without behavioral disturbance: Secondary | ICD-10-CM

## 2022-08-10 DIAGNOSIS — F02818 Dementia in other diseases classified elsewhere, unspecified severity, with other behavioral disturbance: Secondary | ICD-10-CM

## 2022-08-10 DIAGNOSIS — L989 Disorder of the skin and subcutaneous tissue, unspecified: Secondary | ICD-10-CM

## 2022-08-10 LAB — CBC WITH DIFFERENTIAL/PLATELET
Absolute Monocytes: 693 cells/uL (ref 200–950)
Basophils Absolute: 18 cells/uL (ref 0–200)
Basophils Relative: 0.2 %
Eosinophils Absolute: 99 cells/uL (ref 15–500)
Eosinophils Relative: 1.1 %
HCT: 40.4 % (ref 35.0–45.0)
Hemoglobin: 12.8 g/dL (ref 11.7–15.5)
Lymphs Abs: 2367 cells/uL (ref 850–3900)
MCH: 28.1 pg (ref 27.0–33.0)
MCHC: 31.7 g/dL — ABNORMAL LOW (ref 32.0–36.0)
MCV: 88.6 fL (ref 80.0–100.0)
MPV: 10.1 fL (ref 7.5–12.5)
Monocytes Relative: 7.7 %
Neutro Abs: 5823 cells/uL (ref 1500–7800)
Neutrophils Relative %: 64.7 %
Platelets: 188 10*3/uL (ref 140–400)
RBC: 4.56 10*6/uL (ref 3.80–5.10)
RDW: 13.5 % (ref 11.0–15.0)
Total Lymphocyte: 26.3 %
WBC: 9 10*3/uL (ref 3.8–10.8)

## 2022-08-10 MED ORDER — OLMESARTAN MEDOXOMIL 40 MG PO TABS
ORAL_TABLET | ORAL | 3 refills | Status: DC
Start: 2022-08-10 — End: 2022-08-10

## 2022-08-10 MED ORDER — OLMESARTAN MEDOXOMIL 40 MG PO TABS
ORAL_TABLET | ORAL | 0 refills | Status: DC
Start: 2022-08-10 — End: 2022-09-03

## 2022-08-10 NOTE — Patient Instructions (Addendum)
Alzheimer's Disease Caregiver Guide Alzheimer's disease is a condition that makes a person: Forget things. Act differently. Have trouble paying attention and doing simple tasks. These things get worse with time. The tips below can help you care for the person. How to help manage lifestyle changes Tips to help with symptoms Be calm and patient. Give simple, short answers to questions. Avoid correcting the person in a negative way. Try not to take things personally, even if the person forgets your name. Do not argue with the person. This may make the person more upset. Tips to lessen frustration Make appointments and do daily tasks when the person is at his or her best. Take your time. Simple tasks may take longer. Allow plenty of time to complete tasks. Limit choices for the person. Involve the person in what you are doing. Keep things organized: Keep a daily routine. Organize medicines in a pillbox for each day of the week. Keep a calendar in a central location to remind the person of meetings or other activities. Avoid new or crowded places, if possible. Use simple words, short sentences, and a calm voice. Only give one direction at a time. Buy clothes and shoes that are easy to put on and take off. Try to change the subject if the person becomes frustrated or angry. Tips to prevent injury  Keep floors clear. Remove rugs, magazine racks, and floor lamps. Keep hallways well-lit. Put a handrail and non-slip mat in the bathtub or shower. Put childproof locks on cabinets that have dangerous items in them. These items include medicine, alcohol, guns, toxic cleaning items, sharp tools, matches, and lighters. Put locks on doors where the person cannot see or reach them. This helps keep the person from going out of the house and getting lost. Be ready for emergencies. Keep a list of emergency phone numbers and addresses close by. Remove car keys and lock garage doors so that the person  does not try to drive. Bracelets may be worn that track location and identify the person as having memory problems. This should be worn at all times for safety. Tips for the future  Discuss financial and legal planning early. People with this disease have trouble managing their money as the disease gets worse. Get help from a professional. Talk about advance directives, safety, and daily care. Take these steps: Create a living will and choose a power of attorney. This is someone who can make decisions for the person with Alzheimer's disease when he or she can no longer do so. Discuss driving safety and when to stop driving. The person's doctor can help with this. If the person lives alone, make sure he or she is safe. Some people need extra help at home. Other people need more care at a nursing home or care center. How to recognize changes in the person's condition With this disease, memory problems and confusion slowly get worse. In time, the person may not know his or her friends and family members. The disease can also cause changes in behavior and mood, such as anxiety or anger. The person may see, hear, taste, smell, or feel things that are not real (hallucinate). These changes can come on all of a sudden. They may happen in response to something such as: Pain. An infection. Changes in temperature or noise. Too much stimulation. Feeling lost or scared. Medicines. Where to find support Find out about services that can provide short-term care (respite care). These can allow you to take a break when  you need it. Join a support group near you. These groups can help you: Learn ways to manage stress. Share experiences with others. Get emotional comfort and support. Learn about caregiving as the disease gets worse. Know what community resources are available. Where to find more information Alzheimer's Association: LimitLaws.hu Contact a doctor if: The person has a fever. The person has a  sudden behavior change that does not get better with calming strategies. The person is not able to take care of himself or herself at home. You are no longer able to care for the person. Get help right away if: The person has a sudden increase in confusion or new hallucinations. The person threatens you or anyone else, including himself or herself. Get help right away if you feel like your loved one may hurt himself or herself or others, or has thoughts about taking his or her own life. Go to your nearest emergency room or: Call your local emergency services (911 in the [place] Call the National Suicide Prevention Lifeline at 425-069-0668 or 988 in the [place] This is open 24 hours a day. Text the Crisis Text Line at 636-365-2679. Summary Alzheimer's disease causes a person to forget things. A person who has this condition may have trouble doing simple tasks. Take steps to keep the person from getting hurt. Plan for future care. You can find support by joining a support group near you. This information is not intended to replace advice given to you by your health care provider. Make sure you discuss any questions you have with your health care provider. Document Revised: 07/17/2020 Document Reviewed: 04/10/2019 Elsevier Patient Education  2024 Elsevier Inc. Diabetes Mellitus and Nutrition, Adult When you have diabetes, or diabetes mellitus, it is very important to have healthy eating habits because your blood sugar (glucose) levels are greatly affected by what you eat and drink. Eating healthy foods in the right amounts, at about the same times every day, can help you: Manage your blood glucose. Lower your risk of heart disease. Improve your blood pressure. Reach or maintain a healthy weight. What can affect my meal plan? Every person with diabetes is different, and each person has different needs for a meal plan. Your health care provider may recommend that you work with a dietitian to make a meal  plan that is best for you. Your meal plan may vary depending on factors such as: The calories you need. The medicines you take. Your weight. Your blood glucose, blood pressure, and cholesterol levels. Your activity level. Other health conditions you have, such as heart or kidney disease. How do carbohydrates affect me? Carbohydrates, also called carbs, affect your blood glucose level more than any other type of food. Eating carbs raises the amount of glucose in your blood. It is important to know how many carbs you can safely have in each meal. This is different for every person. Your dietitian can help you calculate how many carbs you should have at each meal and for each snack. How does alcohol affect me? Alcohol can cause a decrease in blood glucose (hypoglycemia), especially if you use insulin or take certain diabetes medicines by mouth. Hypoglycemia can be a life-threatening condition. Symptoms of hypoglycemia, such as sleepiness, dizziness, and confusion, are similar to symptoms of having too much alcohol. Do not drink alcohol if: Your health care provider tells you not to drink. You are pregnant, may be pregnant, or are planning to become pregnant. If you drink alcohol: Limit how much you have  to: 0-1 drink a day for women. 0-2 drinks a day for men. Know how much alcohol is in your drink. In the [place] one drink equals one 12 oz bottle of beer (355 mL), one 5 oz glass of wine (148 mL), or one 1 oz glass of hard liquor (44 mL). Keep yourself hydrated with water, diet soda, or unsweetened iced tea. Keep in mind that regular soda, juice, and other mixers may contain a lot of sugar and must be counted as carbs. What are tips for following this plan?  Reading food labels Start by checking the serving size on the Nutrition Facts label of packaged foods and drinks. The number of calories and the amount of carbs, fats, and other nutrients listed on the label are based on one serving of the  item. Many items contain more than one serving per package. Check the total grams (g) of carbs in one serving. Check the number of grams of saturated fats and trans fats in one serving. Choose foods that have a low amount or none of these fats. Check the number of milligrams (mg) of salt (sodium) in one serving. Most people should limit total sodium intake to less than 2,300 mg per day. Always check the nutrition information of foods labeled as "low-fat" or "nonfat." These foods may be higher in added sugar or refined carbs and should be avoided. Talk to your dietitian to identify your daily goals for nutrients listed on the label. Shopping Avoid buying canned, pre-made, or processed foods. These foods tend to be high in fat, sodium, and added sugar. Shop around the outside edge of the grocery store. This is where you will most often find fresh fruits and vegetables, bulk grains, fresh meats, and fresh dairy products. Cooking Use low-heat cooking methods, such as baking, instead of high-heat cooking methods, such as deep frying. Cook using healthy oils, such as olive, canola, or sunflower oil. Avoid cooking with butter, cream, or high-fat meats. Meal planning Eat meals and snacks regularly, preferably at the same times every day. Avoid going long periods of time without eating. Eat foods that are high in fiber, such as fresh fruits, vegetables, beans, and whole grains. Eat 4-6 oz (112-168 g) of lean protein each day, such as lean meat, chicken, fish, eggs, or tofu. One ounce (oz) (28 g) of lean protein is equal to: 1 oz (28 g) of meat, chicken, or fish. 1 egg.  cup (62 g) of tofu. Eat some foods each day that contain healthy fats, such as avocado, nuts, seeds, and fish. What foods should I eat? Fruits Berries. Apples. Oranges. Peaches. Apricots. Plums. Grapes. Mangoes. Papayas. Pomegranates. Kiwi. Cherries. Vegetables Leafy greens, including lettuce, spinach, kale, chard, collard greens,  mustard greens, and cabbage. Beets. Cauliflower. Broccoli. Carrots. Green beans. Tomatoes. Peppers. Onions. Cucumbers. Brussels sprouts. Grains Whole grains, such as whole-wheat or whole-grain bread, crackers, tortillas, cereal, and pasta. Unsweetened oatmeal. Quinoa. Brown or wild rice. Meats and other proteins Seafood. Poultry without skin. Lean cuts of poultry and beef. Tofu. Nuts. Seeds. Dairy Low-fat or fat-free dairy products such as milk, yogurt, and cheese. The items listed above may not be a complete list of foods and beverages you can eat and drink. Contact a dietitian for more information. What foods should I avoid? Fruits Fruits canned with syrup. Vegetables Canned vegetables. Frozen vegetables with butter or cream sauce. Grains Refined white flour and flour products such as bread, pasta, snack foods, and cereals. Avoid all processed foods. Meats and other  proteins Fatty cuts of meat. Poultry with skin. Breaded or fried meats. Processed meat. Avoid saturated fats. Dairy Full-fat yogurt, cheese, or milk. Beverages Sweetened drinks, such as soda or iced tea. The items listed above may not be a complete list of foods and beverages you should avoid. Contact a dietitian for more information. Questions to ask a health care provider Do I need to meet with a certified diabetes care and education specialist? Do I need to meet with a dietitian? What number can I call if I have questions? When are the best times to check my blood glucose? Where to find more information: American Diabetes Association: diabetes.org Academy of Nutrition and Dietetics: eatright. Corporation of Diabetes and Digestive and Kidney Diseases: StageSync.si Association of Diabetes Care & Education Specialists: diabeteseducator.org Summary It is important to have healthy eating habits because your blood sugar (glucose) levels are greatly affected by what you eat and drink. It is important to use  alcohol carefully. A healthy meal plan will help you manage your blood glucose and lower your risk of heart disease. Your health care provider may recommend that you work with a dietitian to make a meal plan that is best for you. This information is not intended to replace advice given to you by your health care provider. Make sure you discuss any questions you have with your health care provider. Document Revised: 07/26/2019 Document Reviewed: 07/26/2019 Elsevier Patient Education  2024 [employer].

## 2022-08-18 DIAGNOSIS — L570 Actinic keratosis: Secondary | ICD-10-CM | POA: Diagnosis not present

## 2022-08-18 DIAGNOSIS — D492 Neoplasm of unspecified behavior of bone, soft tissue, and skin: Secondary | ICD-10-CM | POA: Diagnosis not present

## 2022-08-18 DIAGNOSIS — C44319 Basal cell carcinoma of skin of other parts of face: Secondary | ICD-10-CM | POA: Diagnosis not present

## 2022-08-18 DIAGNOSIS — L578 Other skin changes due to chronic exposure to nonionizing radiation: Secondary | ICD-10-CM | POA: Diagnosis not present

## 2022-08-18 DIAGNOSIS — C44712 Basal cell carcinoma of skin of right lower limb, including hip: Secondary | ICD-10-CM | POA: Diagnosis not present

## 2022-09-03 ENCOUNTER — Telehealth: Payer: Self-pay | Admitting: Nurse Practitioner

## 2022-09-03 ENCOUNTER — Other Ambulatory Visit: Payer: Self-pay | Admitting: Nurse Practitioner

## 2022-09-03 DIAGNOSIS — I10 Essential (primary) hypertension: Secondary | ICD-10-CM

## 2022-09-03 DIAGNOSIS — Z79899 Other long term (current) drug therapy: Secondary | ICD-10-CM

## 2022-09-03 DIAGNOSIS — E782 Mixed hyperlipidemia: Secondary | ICD-10-CM

## 2022-09-03 MED ORDER — OLMESARTAN MEDOXOMIL 40 MG PO TABS
ORAL_TABLET | ORAL | 3 refills | Status: DC
Start: 2022-09-03 — End: 2022-09-21

## 2022-09-03 MED ORDER — ROSUVASTATIN CALCIUM 40 MG PO TABS
ORAL_TABLET | ORAL | 3 refills | Status: AC
Start: 2022-09-03 — End: ?

## 2022-09-03 NOTE — Telephone Encounter (Signed)
Pt would like a refill on benicar and crestor. Pls send to [facility].

## 2022-09-21 ENCOUNTER — Other Ambulatory Visit: Payer: Self-pay | Admitting: Nurse Practitioner

## 2022-09-21 ENCOUNTER — Telehealth: Payer: Self-pay | Admitting: Nurse Practitioner

## 2022-09-21 DIAGNOSIS — Z79899 Other long term (current) drug therapy: Secondary | ICD-10-CM

## 2022-09-21 DIAGNOSIS — I10 Essential (primary) hypertension: Secondary | ICD-10-CM

## 2022-09-21 MED ORDER — OLMESARTAN MEDOXOMIL 40 MG PO TABS
ORAL_TABLET | ORAL | 0 refills | Status: DC
Start: 2022-09-21 — End: 2023-01-05

## 2022-09-21 NOTE — Telephone Encounter (Signed)
Patient's daughter states that they still haven't gotten her olmesartan from mail order that you sent in on 08/29. She is completely out and hasn't had any for a few days. Will you call in a temporary supply to [facility] in [place]? Patient would like a call once it's been done so they know when to go pick it up.

## 2022-09-21 NOTE — Telephone Encounter (Signed)
Please have daughter call [facility] and I have sent  in a 30 day script local to [facility]

## 2022-10-06 ENCOUNTER — Other Ambulatory Visit: Payer: Self-pay | Admitting: Nurse Practitioner

## 2022-10-06 ENCOUNTER — Other Ambulatory Visit: Payer: Self-pay

## 2022-10-06 ENCOUNTER — Telehealth: Payer: Self-pay | Admitting: Nurse Practitioner

## 2022-10-06 DIAGNOSIS — E782 Mixed hyperlipidemia: Secondary | ICD-10-CM

## 2022-10-06 DIAGNOSIS — E039 Hypothyroidism, unspecified: Secondary | ICD-10-CM

## 2022-10-06 MED ORDER — ROSUVASTATIN CALCIUM 40 MG PO TABS
ORAL_TABLET | ORAL | 3 refills | Status: DC
Start: 2022-10-06 — End: 2022-10-06

## 2022-10-06 MED ORDER — LEVOTHYROXINE SODIUM 50 MCG PO TABS
ORAL_TABLET | ORAL | 3 refills | Status: DC
Start: 2022-10-06 — End: 2022-10-06

## 2022-10-06 MED ORDER — LEVOTHYROXINE SODIUM 50 MCG PO TABS: ORAL_TABLET | ORAL | 3 refills | Status: DC

## 2022-10-06 MED ORDER — ROSUVASTATIN CALCIUM 40 MG PO TABS
ORAL_TABLET | ORAL | 3 refills | Status: DC
Start: 2022-10-06 — End: 2023-01-05

## 2022-10-06 NOTE — Telephone Encounter (Signed)
Done

## 2022-10-06 NOTE — Telephone Encounter (Signed)
Requesting refill on Levothyroxine and Rosuvastatin. Pls send to [facility] on file.

## 2022-12-28 NOTE — Progress Notes (Deleted)
Patient ID: Stacy Wallace, female   DOB: Nov 02, 1945, 77 y.o.   MRN: [id number]  COMPLETE PHYSICAL EXAM  Assessment:   Encounter for General adult medical examination with abnormal findings Due annually   Aortic atherosclerosis (HCC) Control blood pressure, cholesterol, glucose, increase exercise.  EKG  Senile purpura (HCC) Reassured, protect skin Avoid picking/scratching at areas  Essential hypertension - Continue olmesartan 40 mg daily - DASH diet, exercise and monitor at home. Call if greater than 140/90.  -     CBC with Differential/Platelet -     CMP/GFR -     TSH - EKG  Alzheimer disease Continue follow up with neuro Exercise at least 20-30 mins a day Encouraged daily log to remember med compliance, fluid intake, daily walking Daughter will help monitor   Hypothyroidism, unspecified type -check TSH level, restart medicaion, reminded to take on an empty stomach 30-72mins before food.  -     TSH  Mixed hyperlipidemia -restart medications, check lipids, decrease fatty foods, increase activity.  -     Lipid panel CMP CBC  Abnormal Glucose Discussed disease progression and risks Discussed diet/exercise, weight management and risk modification  - A1C   Medication management -     Magnesium  Vitamin D deficiency Vit D, would like to see in therapeutic range of 60-100  Insomnia, unspecified type - good sleep hygiene discussed, increase day time activity  DDD (degenerative disc disease), lumbar Increase walking; tylenol PRN  Gastroesophageal reflux disease, esophagitis presence not specified Off of meds; denies sx Magnesium  CKD 3b (HCC) Increase fluids, avoid NSAIDS, monitor sugars, will monitor Keep log to track fluid intake - CMP/GFR Routine urine with reflex microscopic Microalbumin/creatinine urine ration  Prolonged QT on EKG - monitor medications  Risk non-compliance with medications Sent in 1 year supply to mail order pharmacy Keep daily log  of meds if taken Husband is present reminded to call if does not receive medicine by Monday  Screening for AAA - U/S ABD Retroperitoneal LTD  Screening for ischemic heart disease - EKG  Screening for hematuria/proteinuria - UA with microscopic and reflex culture - Microablumin/creatinine urine ratio  Over 40 minutes of exam, counseling, chart review, and critical decision making was performed  Future Appointments  Date Time Provider Department Center  12/31/2022  2:00 PM Raynelle Dick, NP [facility] None  03/31/2023 10:00 AM Raynelle Dick, NP [facility] None  01/04/2024  2:00 PM Raynelle Dick, NP [facility] None      Subjective:   Stacy Wallace is a 77 y.o. female who presents for Medicare Annual Wellness Visit and 3 month follow up on hypertension, prediabetes, hyperlipidemia, vitamin D def.   She has Alzheimer's, no longer on medication or following with Dr. Frances Furbish. She lives with husband in apartment, family checks on her, no longer driving. No falls, no wandering or concern with safety. She does seem to be having progressing of memory loss.  Husband states she does not try to cook and has not wandered from apartment.   BMI is There is no height or weight on file to calculate BMI., She is eating ok and does a lot of walking around her complex Wt Readings from Last 3 Encounters:  08/10/22 114 lb 3.2 oz (51.8 kg)  04/13/22 111 lb 3.2 oz (50.4 kg)  12/25/21 117 lb 3.2 oz (53.2 kg)   She has not been checking BPs at home, does have cuff, daughter can help, she is taking olmesartan 40 mg QD, today  their BP is  . Husband states she is very nervous to be at clinic today.  Will begin checking BP at home BP Readings from Last 3 Encounters:  08/10/22 122/62  04/13/22 128/64  12/25/21 (!) 158/88  She does not workout. She denies chest pain, shortness of breath, dizziness.   She has aortic atherosclerosis per xray 12/2016  She is on cholesterol medication, Rosuvastatin  40 mg, and denies myalgias. Her cholesterol is at goal. The cholesterol last visit was:   Lab Results  Component Value Date   CHOL 155 08/10/2022   HDL 73 08/10/2022   LDLCALC 66 08/10/2022   TRIG 75 08/10/2022   CHOLHDL 2.1 08/10/2022   Patient reports that she has not been monitoring her diet.  She has a history of prediabetes which is diet controlled.  She has not symptoms of polydipsia, polyphagia, or polyuria.   Lab Results  Component Value Date   HGBA1C 5.7 (H) 12/25/2021   She has CKD III monitored at this office.  Lab Results  Component Value Date   EGFR 38 (L) 08/10/2022     Patient is on Vitamin D supplement. Lab Results  Component Value Date   VD25OH 36 12/25/2021    She is currently on Levothyroxine 50 mcg daily. Last TSH was: Lab Results  Component Value Date   TSH 1.29 08/10/2022      Medication Review Current Outpatient Medications on File Prior to Visit  Medication Sig Dispense Refill   levothyroxine (SYNTHROID) 50 MCG tablet TAKE 1 TABLET EVERY DAY BEFORE BREAKFAST 90 tablet 3   olmesartan (BENICAR) 40 MG tablet TAKE 1 TABLET EVERY DAY FOR BLOOD PRESSURE 30 tablet 0   rosuvastatin (CRESTOR) 40 MG tablet Take 1 tablet Daily for Cholesterol 90 tablet 3   No current facility-administered medications on file prior to visit.    Allergies: Allergies  Allergen Reactions   Lopid [Gemfibrozil]     headache   Morphine And Codeine Other (See Comments)    Reaction: unknown    Nickel Other (See Comments)    Reaction: unknown    Xanax [Alprazolam]    Latex Rash    Current Problems (verified) has Hyperlipidemia, mixed; Hypertension; Hypothyroidism; Abnormal glucose; Vitamin D deficiency; GERD (gastroesophageal reflux disease); Medication management; Insomnia; DDD (degenerative disc disease), lumbar; Aortic atherosclerosis (HCC); Alzheimer disease (HCC); CKD stage G3b/A2, GFR 30-44 and albumin creatinine ratio 30-299 mg/g (HCC); BMI 24.0-24.9, adult; Senile  purpura (HCC); and Arm skin lesion, left on their problem list.  Screening Tests Immunization History  Administered Date(s) Administered   Influenza, High Dose Seasonal PF 09/19/2013, 10/16/2014, 10/08/2015, 11/30/2016, 10/06/2021   Influenza-Unspecified 09/15/2012, 11/05/2016, 11/05/2017, 10/06/2019, 11/13/2020   Pneumococcal Conjugate-13 08/08/2015   Pneumococcal-Unspecified 03/18/2010   Td 11/09/2006    Preventative care: Last colonoscopy: 2008 normal, willing to do cologuard Cologuard: 03/2018 Last mammogram: 12/2017, would not pursue treatment declines DEXA: 2013  Left hip Xray 03/2013 MR spine 09/2015  Prior vaccinations: TD or Tdap: 2008 declines  Influenza: 2022 Pneumococcal: 2012 Prevnar13: 2017 Shingles/Zostavax: Declined   Names of Other Physician/Practitioners you currently use: 1. [facility]- here for primary care 2. Dr. Dione Booze, eye doctor, 5+ years ago, readers at home 3. New dentist in [place] - Dr. Jaci Standard, dentist, last 2021  Patient Care Team: Lucky Cowboy, MD as PCP - General (Internal Medicine)  Surgical: She  has a past surgical history that includes Tubal ligation; Eye surgery; Abdominal hysterectomy; Carpal tunnel release (Bilateral, 2001); and Nasal  septum surgery. Family Her family history includes Alcohol abuse in her father; Depression in her sister; Heart disease in her brother, father, and sister; Hyperlipidemia in her father and mother; Hypertension in her father and mother; Stroke in her sister. Social history  She reports that she quit smoking about 34 years ago. Her smoking use included cigarettes. She has never used smokeless tobacco. She reports current alcohol use. No history on file for drug use.  Review of Systems  Constitutional:  Negative for chills and fever.  HENT:  Negative for congestion, hearing loss, sinus pain, sore throat and tinnitus.   Eyes:  Negative for blurred vision and  double vision.  Respiratory:  Negative for cough, hemoptysis, sputum production, shortness of breath and wheezing.   Cardiovascular:  Negative for chest pain, palpitations and leg swelling.  Gastrointestinal:  Negative for abdominal pain, constipation, diarrhea, heartburn, nausea and vomiting.  Genitourinary:  Negative for dysuria and urgency.  Musculoskeletal:  Negative for back pain, falls, joint pain, myalgias and neck pain.  Skin:  Negative for rash.  Neurological:  Negative for dizziness, tingling, tremors, weakness and headaches.  Endo/Heme/Allergies:  Bruises/bleeds easily.  Psychiatric/Behavioral:  Positive for memory loss. Negative for depression and suicidal ideas. The patient is not nervous/anxious and does not have insomnia.      Objective:   There were no vitals filed for this visit.    There is no height or weight on file to calculate BMI.  General appearance: alert, no distress, WD/WN,  female HEENT: normocephalic, sclerae anicteric, TMs pearly, nares patent, no discharge or erythema, pharynx normal Oral cavity: MMM, no lesions Neck: supple, no lymphadenopathy, no thyromegaly, no masses Heart: RRR, normal S1, S2, no murmurs Lungs: CTA bilaterally, no wheezes, rhonchi, or rales Abdomen: +bs, soft, non tender, non distended, no masses, no hepatomegaly, no splenomegaly Musculoskeletal: nontender, no swelling, no obvious deformity, no SI tenderness, neg straight leg.  Extremities: no edema, no cyanosis, no clubbing Pulses: 2+ symmetric, upper and lower extremities, normal cap refill Neurological: alert, oriented x 3, CN2-12 intact, strength normal upper extremities and lower extremities, sensation normal throughout, DTRs 2+ throughout, no cerebellar signs, gait normal. Scattered thought process, poor short term recall Psychiatric: normal affect, behavior normal, pleasant , 0/3 word recall.  MMSE 10/20 Derm: warm/dry intact;  EKG: NSR, prolonged QT  AAA: < 3 cm  Raynelle Dick, NP   12/28/2022

## 2022-12-31 ENCOUNTER — Encounter: Payer: Medicare HMO | Admitting: Nurse Practitioner

## 2022-12-31 DIAGNOSIS — E559 Vitamin D deficiency, unspecified: Secondary | ICD-10-CM

## 2022-12-31 DIAGNOSIS — N1832 Chronic kidney disease, stage 3b: Secondary | ICD-10-CM

## 2022-12-31 DIAGNOSIS — Z0001 Encounter for general adult medical examination with abnormal findings: Secondary | ICD-10-CM

## 2022-12-31 DIAGNOSIS — K219 Gastro-esophageal reflux disease without esophagitis: Secondary | ICD-10-CM

## 2022-12-31 DIAGNOSIS — Z9189 Other specified personal risk factors, not elsewhere classified: Secondary | ICD-10-CM

## 2022-12-31 DIAGNOSIS — Z136 Encounter for screening for cardiovascular disorders: Secondary | ICD-10-CM

## 2022-12-31 DIAGNOSIS — I10 Essential (primary) hypertension: Secondary | ICD-10-CM

## 2022-12-31 DIAGNOSIS — E782 Mixed hyperlipidemia: Secondary | ICD-10-CM

## 2022-12-31 DIAGNOSIS — I700 Atherosclerosis of aorta: Secondary | ICD-10-CM

## 2022-12-31 DIAGNOSIS — D692 Other nonthrombocytopenic purpura: Secondary | ICD-10-CM

## 2022-12-31 DIAGNOSIS — R9431 Abnormal electrocardiogram [ECG] [EKG]: Secondary | ICD-10-CM

## 2022-12-31 DIAGNOSIS — Z1389 Encounter for screening for other disorder: Secondary | ICD-10-CM

## 2022-12-31 DIAGNOSIS — R7309 Other abnormal glucose: Secondary | ICD-10-CM

## 2022-12-31 DIAGNOSIS — E039 Hypothyroidism, unspecified: Secondary | ICD-10-CM

## 2022-12-31 DIAGNOSIS — Z79899 Other long term (current) drug therapy: Secondary | ICD-10-CM

## 2022-12-31 DIAGNOSIS — G4700 Insomnia, unspecified: Secondary | ICD-10-CM

## 2022-12-31 DIAGNOSIS — M51369 Other intervertebral disc degeneration, lumbar region without mention of lumbar back pain or lower extremity pain: Secondary | ICD-10-CM

## 2022-12-31 DIAGNOSIS — G309 Alzheimer's disease, unspecified: Secondary | ICD-10-CM

## 2023-01-04 NOTE — Progress Notes (Addendum)
 Patient ID: Stacy Wallace, female   DOB: 1945-12-14, 77 y.o.   MRN: [id number]  COMPLETE PHYSICAL EXAM  Assessment:   Encounter for General adult medical examination with abnormal findings Due annually   Aortic atherosclerosis (HCC) Control blood pressure, cholesterol, glucose, increase exercise.  EKG  Senile purpura (HCC) Reassured, protect skin Avoid picking/scratching at areas  Essential hypertension - Continue olmesartan  40 mg daily - DASH diet, exercise and monitor at home. Call if greater than 140/90.  -     CBC with Differential/Platelet -     CMP/GFR -     TSH - EKG  Alzheimer disease with behavioral disturbances(HCC) Symptoms are becoming markedly worse- daughter is monitoring and helping daily- she is threatening to hit , gets angry No longer follows with neurology Start Rexulti  0.5 mg x 7 days then increase to 1 mg every day - notify office around 3 week mark to determine if this is helping with anger outbursts   Hypothyroidism, unspecified type -check TSH level, restart medicaion, reminded to take on an empty stomach 30-32mins before food.  -     TSH  Mixed hyperlipidemia -restart medications, check lipids, decrease fatty foods, increase activity.  -     Lipid panel CMP CBC  Abnormal Glucose Discussed disease progression and risks Discussed diet/exercise, weight management and risk modification  - A1C   Medication management -     Magnesium  Vitamin D  deficiency Vit D, would like to see in therapeutic range of 60-100  Insomnia, unspecified type - good sleep hygiene discussed, increase day time activity  DDD (degenerative disc disease), lumbar Increase walking; tylenol PRN  Gastroesophageal reflux disease, esophagitis presence not specified Off of meds; denies sx Magnesium  CKD 3b (HCC) Increase fluids, avoid NSAIDS, monitor sugars, will monitor Keep log to track fluid intake - CMP/GFR Routine urine with reflex  microscopic Microalbumin/creatinine urine ration  Prolonged QT on EKG - monitor medications  Risk non-compliance with medications Sent in 1 year supply to mail order pharmacy Keep daily log of meds if taken Husband is present reminded to call if does not receive medicine by Monday  Screening for AAA - U/S ABD Retroperitoneal LTD  Screening for ischemic heart disease - EKG  Screening for hematuria/proteinuria - UA with microscopic and reflex culture - Microablumin/creatinine urine ratio  Urine abnormality - Urine culture Not able to clean properly after BM, working with daughter  Over 40 minutes of exam, counseling, chart review, and critical decision making was performed  Future Appointments  Date Time Provider Department Center  03/31/2023 10:00 AM Stacy Wallace E, NP [facility] None  01/07/2024  9:00 AM Stacy Lonell BRAVO, NP [facility] None      Subjective:   Stacy Wallace is a 77 y.o. female who presents for Medicare Annual Wellness Visit and 3 month follow up on hypertension, prediabetes, hyperlipidemia, vitamin D  def.   She has Alzheimer's, no longer on medication or following with Dr. Buck. She lives with husband in apartment, family checks on her, no longer driving. . Extreme progression of her memory loss.  She is starting to wander throughout the apartment all night.  Has not left the house Husband states she does not try to cook and has not wandered from apartment. She previously walked a lot and no longer can do a walk. No incontinence as of yet. Does not wipe appropriately after BM- daughter concerned for UTI. She has been having  episodes of getting mean and trying to hit her daughter  and grandson.   BMI is Body mass index is 23.24 kg/m., She is eating ok and does a lot of walking around her complex Wt Readings from Last 3 Encounters:  01/05/23 119 lb (54 kg)  08/10/22 114 lb 3.2 oz (51.8 kg)  04/13/22 111 lb 3.2 oz (50.4 kg)   She has not been checking  BPs at home, does have cuff, daughter can help, she is taking olmesartan  40 mg QD, today their BP is BP: 118/62. Husband states she is very nervous to be at clinic today.  Will begin checking BP at home BP Readings from Last 3 Encounters:  01/05/23 118/62  08/10/22 122/62  04/13/22 128/64  She does not workout. She denies chest pain, shortness of breath, dizziness.   She has aortic atherosclerosis per xray 12/2016  She is on cholesterol medication, Rosuvastatin  40 mg, and denies myalgias. Her cholesterol is at goal. The cholesterol last visit was:   Lab Results  Component Value Date   CHOL 155 08/10/2022   HDL 73 08/10/2022   LDLCALC 66 08/10/2022   TRIG 75 08/10/2022   CHOLHDL 2.1 08/10/2022   Patient reports that she has not been monitoring her diet.  She has a history of prediabetes which is diet controlled.  She has not symptoms of polydipsia, polyphagia, or polyuria.   Lab Results  Component Value Date   HGBA1C 5.7 (H) 12/25/2021   She has CKD III monitored at this office.  Lab Results  Component Value Date   EGFR 38 (L) 08/10/2022     Patient is on Vitamin D  supplement. Lab Results  Component Value Date   VD25OH 36 12/25/2021    She is currently on Levothyroxine  50 mcg daily. Last TSH was: Lab Results  Component Value Date   TSH 1.29 08/10/2022      Medication Review Current Outpatient Medications on File Prior to Visit  Medication Sig Dispense Refill   levothyroxine  (SYNTHROID ) 50 MCG tablet TAKE 1 TABLET EVERY DAY BEFORE BREAKFAST 90 tablet 3   olmesartan  (BENICAR ) 40 MG tablet TAKE 1 TABLET EVERY DAY FOR BLOOD PRESSURE 30 tablet 0   rosuvastatin  (CRESTOR ) 40 MG tablet Take 1 tablet Daily for Cholesterol 90 tablet 3   No current facility-administered medications on file prior to visit.    Allergies: Allergies  Allergen Reactions   Lopid [Gemfibrozil]     headache   Morphine And Codeine Other (See Comments)    Reaction: unknown    Nickel Other (See  Comments)    Reaction: unknown    Xanax  [Alprazolam ]    Latex Rash    Current Problems (verified) has Hyperlipidemia, mixed; Hypertension; Hypothyroidism; Abnormal glucose; Vitamin D  deficiency; GERD (gastroesophageal reflux disease); Medication management; Insomnia; DDD (degenerative disc disease), lumbar; Aortic atherosclerosis (HCC); Alzheimer disease (HCC); CKD stage G3b/A2, GFR 30-44 and albumin creatinine ratio 30-299 mg/g (HCC); BMI 24.0-24.9, adult; Senile purpura (HCC); and Arm skin lesion, left on their problem list.  Screening Tests Immunization History  Administered Date(s) Administered   Fluad Trivalent(High Dose 65+) 10/14/2022   Influenza, High Dose Seasonal PF 09/19/2013, 10/16/2014, 10/08/2015, 11/30/2016, 10/06/2021   Influenza-Unspecified 09/15/2012, 11/05/2016, 11/05/2017, 10/06/2019, 11/13/2020   Pneumococcal Conjugate-13 08/08/2015   Pneumococcal-Unspecified 03/18/2010   Td 11/09/2006   Health Maintenance  Topic Date Due   COVID-19 Vaccine (1) Never done   Zoster Vaccines- Shingrix (1 of 2) Never done   Pneumonia Vaccine 4+ Years old (2 of 2 - PPSV23 or PCV20) 04/13/2023 (Originally 10/03/2015)   Medicare Annual  Wellness (AWV)  04/13/2023   INFLUENZA VACCINE  Completed   DEXA SCAN  Completed   HPV VACCINES  Aged Out   DTaP/Tdap/Td  Discontinued   Colonoscopy  Discontinued   Hepatitis C Screening  Discontinued   Fecal DNA (Cologuard)  Discontinued     Names of Other Physician/Practitioners you currently use: 1. [facility]- here for primary care 2. Dr. Octavia, eye doctor, 5+ years ago, readers at home 3. New dentist in [place] - Dr. Cindy, dentist, last 2021  Patient Care Team: Tonita Fallow, MD as PCP - General (Internal Medicine)  Surgical: She  has a past surgical history that includes Tubal ligation; Eye surgery; Abdominal hysterectomy; Carpal tunnel release (Bilateral, 2001); and Nasal septum  surgery. Family Her family history includes Alcohol abuse in her father; Depression in her sister; Heart disease in her brother, father, and sister; Hyperlipidemia in her father and mother; Hypertension in her father and mother; Stroke in her sister. Social history  She reports that she quit smoking about 34 years ago. Her smoking use included cigarettes. She has never used smokeless tobacco. She reports current alcohol use. No history on file for drug use.  Review of Systems  Constitutional:  Negative for chills and fever.  HENT:  Negative for congestion, hearing loss, sinus pain, sore throat and tinnitus.   Eyes:  Negative for blurred vision and double vision.  Respiratory:  Negative for cough, hemoptysis, sputum production, shortness of breath and wheezing.   Cardiovascular:  Negative for chest pain, palpitations and leg swelling.  Gastrointestinal:  Negative for abdominal pain, constipation, diarrhea, heartburn, nausea and vomiting.  Genitourinary:  Negative for dysuria and urgency.  Musculoskeletal:  Negative for back pain, falls, joint pain, myalgias and neck pain.  Skin:  Negative for rash.  Neurological:  Negative for dizziness, tingling, tremors, weakness and headaches.  Endo/Heme/Allergies:  Bruises/bleeds easily.  Psychiatric/Behavioral:  Positive for memory loss. Negative for depression and suicidal ideas. The patient has insomnia. The patient is not nervous/anxious.        Agitation     Objective:   Today's Vitals   01/05/23 0857  BP: 118/62  Pulse: 83  Temp: (!) 97.5 F (36.4 C)  SpO2: 96%  Weight: 119 lb (54 kg)  Height: 5' (1.524 m)      Body mass index is 23.24 kg/m.  General appearance: alert, no distress,  female HEENT: normocephalic, sclerae anicteric, TMs pearly, nares patent, no discharge or erythema, pharynx normal Oral cavity: MMM, no lesions Neck: supple, no lymphadenopathy, no thyromegaly, no masses Heart: RRR, normal S1, S2, no murmurs Lungs:  CTA bilaterally, no wheezes, rhonchi, or rales Abdomen: +bs, soft, non tender, non distended, no masses, no hepatomegaly, no splenomegaly Musculoskeletal: nontender, no swelling, no obvious deformity, no SI tenderness, neg straight leg.  Extremities: no edema, no cyanosis, no clubbing Pulses: 2+ symmetric, upper and lower extremities, normal cap refill Neurological: alert, oriented x 3, CN2-12 intact, strength normal upper extremities and lower extremities, sensation normal throughout, DTRs 2+ throughout, no cerebellar signs, gait normal. Scattered thought process, poor short term recall Psychiatric: flat affect, very little oral communication, difficulty following tasks Derm: warm/dry intact;  EKG: NSR, no ST changes  AAA: < 3 cm  LONELL FORBES [place], NP   01/05/2023

## 2023-01-05 ENCOUNTER — Encounter: Payer: Self-pay | Admitting: Nurse Practitioner

## 2023-01-05 ENCOUNTER — Ambulatory Visit (INDEPENDENT_AMBULATORY_CARE_PROVIDER_SITE_OTHER): Payer: Medicare HMO | Admitting: Nurse Practitioner

## 2023-01-05 VITALS — BP 118/62 | HR 83 | Temp 97.5°F | Ht 60.0 in | Wt 119.0 lb

## 2023-01-05 DIAGNOSIS — E039 Hypothyroidism, unspecified: Secondary | ICD-10-CM | POA: Diagnosis not present

## 2023-01-05 DIAGNOSIS — M5136 Other intervertebral disc degeneration, lumbar region with discogenic back pain only: Secondary | ICD-10-CM

## 2023-01-05 DIAGNOSIS — R6889 Other general symptoms and signs: Secondary | ICD-10-CM

## 2023-01-05 DIAGNOSIS — Z79899 Other long term (current) drug therapy: Secondary | ICD-10-CM

## 2023-01-05 DIAGNOSIS — R8290 Unspecified abnormal findings in urine: Secondary | ICD-10-CM | POA: Diagnosis not present

## 2023-01-05 DIAGNOSIS — I10 Essential (primary) hypertension: Secondary | ICD-10-CM | POA: Diagnosis not present

## 2023-01-05 DIAGNOSIS — G4700 Insomnia, unspecified: Secondary | ICD-10-CM

## 2023-01-05 DIAGNOSIS — Z136 Encounter for screening for cardiovascular disorders: Secondary | ICD-10-CM | POA: Diagnosis not present

## 2023-01-05 DIAGNOSIS — E559 Vitamin D deficiency, unspecified: Secondary | ICD-10-CM

## 2023-01-05 DIAGNOSIS — Z9189 Other specified personal risk factors, not elsewhere classified: Secondary | ICD-10-CM

## 2023-01-05 DIAGNOSIS — F02818 Dementia in other diseases classified elsewhere, unspecified severity, with other behavioral disturbance: Secondary | ICD-10-CM

## 2023-01-05 DIAGNOSIS — Z1389 Encounter for screening for other disorder: Secondary | ICD-10-CM | POA: Diagnosis not present

## 2023-01-05 DIAGNOSIS — Z0001 Encounter for general adult medical examination with abnormal findings: Secondary | ICD-10-CM

## 2023-01-05 DIAGNOSIS — I700 Atherosclerosis of aorta: Secondary | ICD-10-CM

## 2023-01-05 DIAGNOSIS — K219 Gastro-esophageal reflux disease without esophagitis: Secondary | ICD-10-CM | POA: Diagnosis not present

## 2023-01-05 DIAGNOSIS — D692 Other nonthrombocytopenic purpura: Secondary | ICD-10-CM

## 2023-01-05 DIAGNOSIS — R7309 Other abnormal glucose: Secondary | ICD-10-CM

## 2023-01-05 DIAGNOSIS — N1832 Chronic kidney disease, stage 3b: Secondary | ICD-10-CM

## 2023-01-05 DIAGNOSIS — E782 Mixed hyperlipidemia: Secondary | ICD-10-CM | POA: Diagnosis not present

## 2023-01-05 MED ORDER — OLMESARTAN MEDOXOMIL 40 MG PO TABS: ORAL_TABLET | ORAL | 0 refills | Status: DC

## 2023-01-05 MED ORDER — LEVOTHYROXINE SODIUM 50 MCG PO TABS: ORAL_TABLET | ORAL | 3 refills | Status: DC

## 2023-01-05 MED ORDER — ROSUVASTATIN CALCIUM 40 MG PO TABS: ORAL_TABLET | ORAL | 3 refills | Status: DC

## 2023-01-05 NOTE — Patient Instructions (Signed)
Alzheimer's Disease Caregiver Guide Alzheimer's disease is a condition that makes a person: Forget things. Act differently. Have trouble paying attention and doing simple tasks. These things get worse with time. The tips below can help you care for the person. How to help manage lifestyle changes Tips to help with symptoms Be calm and patient. Give simple, short answers to questions. Avoid correcting the person in a negative way. Try not to take things personally, even if the person forgets your name. Do not argue with the person. This may make the person more upset. Tips to lessen frustration Make appointments and do daily tasks when the person is at his or her best. Take your time. Simple tasks may take longer. Allow plenty of time to complete tasks. Limit choices for the person. Involve the person in what you are doing. Keep things organized: Keep a daily routine. Organize medicines in a pillbox for each day of the week. Keep a calendar in a central location to remind the person of meetings or other activities. Avoid new or crowded places, if possible. Use simple words, short sentences, and a calm voice. Only give one direction at a time. Buy clothes and shoes that are easy to put on and take off. Try to change the subject if the person becomes frustrated or angry. Tips to prevent injury  Keep floors clear. Remove rugs, magazine racks, and floor lamps. Keep hallways well-lit. Put a handrail and non-slip mat in the bathtub or shower. Put childproof locks on cabinets that have dangerous items in them. These items include medicine, alcohol, guns, toxic cleaning items, sharp tools, matches, and lighters. Put locks on doors where the person cannot see or reach them. This helps keep the person from going out of the house and getting lost. Be ready for emergencies. Keep a list of emergency phone numbers and addresses close by. Remove car keys and lock garage doors so that the person  does not try to drive. Bracelets may be worn that track location and identify the person as having memory problems. This should be worn at all times for safety. Tips for the future  Discuss financial and legal planning early. People with this disease have trouble managing their money as the disease gets worse. Get help from a professional. Talk about advance directives, safety, and daily care. Take these steps: Create a living will and choose a power of attorney. This is someone who can make decisions for the person with Alzheimer's disease when he or she can no longer do so. Discuss driving safety and when to stop driving. The person's doctor can help with this. If the person lives alone, make sure he or she is safe. Some people need extra help at home. Other people need more care at a nursing home or care center. How to recognize changes in the person's condition With this disease, memory problems and confusion slowly get worse. In time, the person may not know his or her friends and family members. The disease can also cause changes in behavior and mood, such as anxiety or anger. The person may see, hear, taste, smell, or feel things that are not real (hallucinate). These changes can come on all of a sudden. They may happen in response to something such as: Pain. An infection. Changes in temperature or noise. Too much stimulation. Feeling lost or scared. Medicines. Where to find support Find out about services that can provide short-term care (respite care). These can allow you to take a break when  you need it. Join a support group near you. These groups can help you: Learn ways to manage stress. Share experiences with others. Get emotional comfort and support. Learn about caregiving as the disease gets worse. Know what community resources are available. Where to find more information Alzheimer's Association: LimitLaws.hu Contact a doctor if: The person has a fever. The person has a  sudden behavior change that does not get better with calming strategies. The person is not able to take care of himself or herself at home. You are no longer able to care for the person. Get help right away if: The person has a sudden increase in confusion or new hallucinations. The person threatens you or anyone else, including himself or herself. Get help right away if you feel like your loved one may hurt himself or herself or others, or has thoughts about taking his or her own life. Go to your nearest emergency room or: Call your local emergency services (911 in the [place] Call the National Suicide Prevention Lifeline at 415-597-4535 or 988 in the [place] This is open 24 hours a day. Text the Crisis Text Line at (720)243-4859. Summary Alzheimer's disease causes a person to forget things. A person who has this condition may have trouble doing simple tasks. Take steps to keep the person from getting hurt. Plan for future care. You can find support by joining a support group near you. This information is not intended to replace advice given to you by your health care provider. Make sure you discuss any questions you have with your health care provider. Document Revised: 07/17/2020 Document Reviewed: 04/10/2019 Elsevier Patient Education  2024 [employer].

## 2023-01-06 LAB — COMPLETE METABOLIC PANEL WITH GFR
AG Ratio: 1.5 (calc) (ref 1.0–2.5)
ALT: 16 U/L (ref 6–29)
AST: 25 U/L (ref 10–35)
Albumin: 4.3 g/dL (ref 3.6–5.1)
Alkaline phosphatase (APISO): 95 U/L (ref 37–153)
BUN/Creatinine Ratio: 12 (calc) (ref 6–22)
BUN: 21 mg/dL (ref 7–25)
CO2: 22 mmol/L (ref 20–32)
Calcium: 9.8 mg/dL (ref 8.6–10.4)
Chloride: 111 mmol/L — ABNORMAL HIGH (ref 98–110)
Creat: 1.72 mg/dL — ABNORMAL HIGH (ref 0.60–1.00)
Globulin: 2.8 g/dL (ref 1.9–3.7)
Glucose, Bld: 94 mg/dL (ref 65–99)
Potassium: 4.3 mmol/L (ref 3.5–5.3)
Sodium: 144 mmol/L (ref 135–146)
Total Bilirubin: 0.6 mg/dL (ref 0.2–1.2)
Total Protein: 7.1 g/dL (ref 6.1–8.1)
eGFR: 30 mL/min/{1.73_m2} — ABNORMAL LOW (ref >=60)

## 2023-01-06 LAB — CBC WITH DIFFERENTIAL/PLATELET
Absolute Lymphocytes: 2092 {cells}/uL (ref 850–3900)
Absolute Monocytes: 606 {cells}/uL (ref 200–950)
Basophils Absolute: 17 {cells}/uL (ref 0–200)
Basophils Relative: 0.2 %
Eosinophils Absolute: 83 {cells}/uL (ref 15–500)
Eosinophils Relative: 1 %
HCT: 40.2 % (ref 35.0–45.0)
Hemoglobin: 12.6 g/dL (ref 11.7–15.5)
MCH: 27.5 pg (ref 27.0–33.0)
MCHC: 31.3 g/dL — ABNORMAL LOW (ref 32.0–36.0)
MCV: 87.6 fL (ref 80.0–100.0)
MPV: 10.3 fL (ref 7.5–12.5)
Monocytes Relative: 7.3 %
Neutro Abs: 5503 {cells}/uL (ref 1500–7800)
Neutrophils Relative %: 66.3 %
Platelets: 166 10*3/uL (ref 140–400)
RBC: 4.59 10*6/uL (ref 3.80–5.10)
RDW: 15.3 % — ABNORMAL HIGH (ref 11.0–15.0)
Total Lymphocyte: 25.2 %
WBC: 8.3 10*3/uL (ref 3.8–10.8)

## 2023-01-06 LAB — MICROSCOPIC MESSAGE

## 2023-01-06 LAB — URINALYSIS, ROUTINE W REFLEX MICROSCOPIC
Bacteria, UA: NONE SEEN /[HPF]
Bilirubin Urine: NEGATIVE
Glucose, UA: NEGATIVE
Hyaline Cast: NONE SEEN /[LPF]
Ketones, ur: NEGATIVE
Leukocytes,Ua: NEGATIVE
Nitrite: NEGATIVE
RBC / HPF: NONE SEEN /[HPF] (ref 0–2)
Specific Gravity, Urine: 1.009 (ref 1.001–1.035)
WBC, UA: NONE SEEN /[HPF] (ref 0–5)
pH: 6.5 (ref 5.0–8.0)

## 2023-01-06 LAB — TSH: TSH: 1.13 m[IU]/L (ref 0.40–4.50)

## 2023-01-06 LAB — MICROALBUMIN / CREATININE URINE RATIO
Creatinine, Urine: 63 mg/dL (ref 20–275)
Microalb Creat Ratio: 132 mg/g{creat} — ABNORMAL HIGH (ref <30)
Microalb, Ur: 8.3 mg/dL

## 2023-01-06 LAB — LIPID PANEL
Cholesterol: 165 mg/dL (ref <200)
HDL: 80 mg/dL (ref >=50)
LDL Cholesterol (Calc): 69 mg/dL
Non-HDL Cholesterol (Calc): 85 mg/dL (ref <130)
Total CHOL/HDL Ratio: 2.1 (calc) (ref <5.0)
Triglycerides: 82 mg/dL (ref <150)

## 2023-01-06 LAB — MAGNESIUM: Magnesium: 2.5 mg/dL (ref 1.5–2.5)

## 2023-01-06 LAB — URINE CULTURE
MICRO NUMBER:: 15906600
Result:: NO GROWTH
SPECIMEN QUALITY:: ADEQUATE

## 2023-01-06 LAB — HEMOGLOBIN A1C W/OUT EAG: Hgb A1c MFr Bld: 5.9 %{Hb} — ABNORMAL HIGH (ref <5.7)

## 2023-01-06 LAB — VITAMIN D 25 HYDROXY (VIT D DEFICIENCY, FRACTURES): Vit D, 25-Hydroxy: 27 ng/mL — ABNORMAL LOW (ref 30–100)

## 2023-01-12 ENCOUNTER — Telehealth: Payer: Self-pay | Admitting: Nurse Practitioner

## 2023-01-12 ENCOUNTER — Other Ambulatory Visit: Payer: Self-pay | Admitting: Nurse Practitioner

## 2023-01-12 MED ORDER — BREXPIPRAZOLE 1 MG PO TABS: 1.0000 mg | ORAL_TABLET | Freq: Every day | ORAL | 1 refills | Status: DC

## 2023-01-12 NOTE — Telephone Encounter (Signed)
 I sent the Rexulti 1 mg to the pharmacy in [place]

## 2023-01-12 NOTE — Telephone Encounter (Signed)
 Patient's daughter contacted the office and states that the samples of medication that you gave her to help calm her, has been working. Per patient's spouse, she has been much calmer and has been sleeping well. Will you send in an rx to [facility] in [place]?

## 2023-01-13 ENCOUNTER — Telehealth: Payer: Self-pay

## 2023-01-13 NOTE — Telephone Encounter (Signed)
Prior auth completed and submitted. 

## 2023-01-19 ENCOUNTER — Telehealth: Payer: Self-pay | Admitting: Nurse Practitioner

## 2023-01-19 NOTE — Telephone Encounter (Signed)
 Per daughter, Rexulti is not covered under her insurance. It was going to be $600.00, and they cannot afford that. Is there an alternative?

## 2023-01-19 NOTE — Telephone Encounter (Signed)
 Please call daughter and ask if she would like to try a small dose of lexapro daily. Should be covered and would hopefully help her symptoms

## 2023-01-19 NOTE — Telephone Encounter (Signed)
 Was the prior auth approved? Is this the price with prior auth?

## 2023-02-02 ENCOUNTER — Other Ambulatory Visit: Payer: Self-pay

## 2023-02-02 DIAGNOSIS — I10 Essential (primary) hypertension: Secondary | ICD-10-CM

## 2023-02-02 DIAGNOSIS — Z79899 Other long term (current) drug therapy: Secondary | ICD-10-CM

## 2023-02-02 DIAGNOSIS — E039 Hypothyroidism, unspecified: Secondary | ICD-10-CM

## 2023-02-02 DIAGNOSIS — E782 Mixed hyperlipidemia: Secondary | ICD-10-CM

## 2023-02-02 MED ORDER — LEVOTHYROXINE SODIUM 50 MCG PO TABS: ORAL_TABLET | ORAL | 3 refills | Status: DC

## 2023-02-02 MED ORDER — ROSUVASTATIN CALCIUM 40 MG PO TABS: ORAL_TABLET | ORAL | 3 refills | Status: DC

## 2023-02-02 MED ORDER — OLMESARTAN MEDOXOMIL 40 MG PO TABS: ORAL_TABLET | ORAL | 0 refills | Status: DC

## 2023-03-31 ENCOUNTER — Ambulatory Visit: Payer: Medicare HMO | Admitting: Nurse Practitioner

## 2023-04-09 ENCOUNTER — Ambulatory Visit (INDEPENDENT_AMBULATORY_CARE_PROVIDER_SITE_OTHER): Admitting: Nurse Practitioner

## 2023-04-09 ENCOUNTER — Encounter: Payer: Self-pay | Admitting: Nurse Practitioner

## 2023-04-09 VITALS — BP 130/88 | HR 77 | Temp 97.4°F | Resp 16 | Ht 60.0 in | Wt 124.4 lb

## 2023-04-09 DIAGNOSIS — G309 Alzheimer's disease, unspecified: Secondary | ICD-10-CM

## 2023-04-09 DIAGNOSIS — I700 Atherosclerosis of aorta: Secondary | ICD-10-CM

## 2023-04-09 DIAGNOSIS — E782 Mixed hyperlipidemia: Secondary | ICD-10-CM

## 2023-04-09 DIAGNOSIS — I10 Essential (primary) hypertension: Secondary | ICD-10-CM

## 2023-04-09 DIAGNOSIS — E039 Hypothyroidism, unspecified: Secondary | ICD-10-CM | POA: Diagnosis not present

## 2023-04-09 DIAGNOSIS — R739 Hyperglycemia, unspecified: Secondary | ICD-10-CM

## 2023-04-09 DIAGNOSIS — F02818 Dementia in other diseases classified elsewhere, unspecified severity, with other behavioral disturbance: Secondary | ICD-10-CM

## 2023-04-09 DIAGNOSIS — E559 Vitamin D deficiency, unspecified: Secondary | ICD-10-CM

## 2023-04-09 NOTE — Progress Notes (Signed)
 Careteam: Patient Care Team: Sharon Seller, NP as PCP - General (Geriatric Medicine)  PLACE OF SERVICE:  [facility]  Advanced Directive information Does Patient Have a Medical Advance Directive?: Yes, Type of Advance Directive: Healthcare Power of [place];Living will;Out of facility DNR (pink MOST or yellow form), Does patient want to make changes to medical advance directive?: No - Patient declined  Allergies  Allergen Reactions   Lopid [Gemfibrozil]     headache   Morphine And Codeine Other (See Comments)    Reaction: unknown    Nickel Other (See Comments)    Reaction: unknown    Xanax [Alprazolam]    Latex Rash    Chief Complaint  Patient presents with   Establish Care    New patient.   Discussed the use of AI scribe software for clinical note transcription with the patient, who gave verbal consent to proceed.  History of Present Illness   Stacy Wallace is an 78 year old female who presents for establishing care. She is accompanied by her daughter.  She has a history of hypertension, dementia and hyperlipidemia. Her blood pressure is currently stable despite not taking Benicar 40 mg daily for weeks due to a pharmacy issue. She continues to take Crestor 40 mg daily, with her last cholesterol check in December showing an LDL of 69.  She has hypothyroidism, managed with Synthroid (levothyroxine) 50 micrograms daily. Her thyroid function was stable when last checked in December.  She has a history of vitamin D deficiency and may be taking vitamin D supplements, though this needs confirmation.   She has prediabetes with abnormal glucose levels and an A1c in the prediabetic range. She has a preference for sweets, which may be related to age-related changes in taste perception and previous smoking.  She has a history of acid reflux but is not currently on medication and is asymptomatic.  She experiences sleep disturbances, often waking up in the middle of the night  and having difficulty returning to sleep. She does not nap during the day and goes to bed around 10 or 11 PM. No falls in the past 20 years.  She has gained weight since last year, now weighing 129 pounds, up from 114 pounds in August 2024. Her appetite has improved, and she is eating better, with her husband ensuring regular meals.   She lives with her husband, who assists with her daily activities, including meal preparation and personal care.       Review of Systems:  Review of Systems  Constitutional:  Negative for chills, fever and weight loss.  HENT:  Negative for tinnitus.   Respiratory:  Negative for cough, sputum production and shortness of breath.   Cardiovascular:  Negative for chest pain, palpitations and leg swelling.  Gastrointestinal:  Negative for abdominal pain, constipation, diarrhea and heartburn.  Genitourinary:  Negative for dysuria, frequency and urgency.  Musculoskeletal:  Negative for back pain, falls, joint pain and myalgias.  Skin: Negative.   Neurological:  Negative for dizziness and headaches.  Psychiatric/Behavioral:  Positive for memory loss. Negative for depression. The patient has insomnia.     Past Medical History:  Diagnosis Date   GERD (gastroesophageal reflux disease)    Hyperlipidemia    Hypertension    Other abnormal glucose    Unspecified hypothyroidism    Unspecified vitamin D deficiency    Past Surgical History:  Procedure Laterality Date   ABDOMINAL HYSTERECTOMY     CARPAL TUNNEL RELEASE Bilateral 2001  EYE SURGERY     NASAL SEPTUM SURGERY     TUBAL LIGATION     Social History:   reports that she quit smoking about 35 years ago. Her smoking use included cigarettes. She has never used smokeless tobacco. She reports current alcohol use. No history on file for drug use.  Family History  Problem Relation Age of Onset   Hypertension Mother    Hyperlipidemia Mother    Heart disease Father    Alcohol abuse Father    Hyperlipidemia  Father    Hypertension Father    Heart disease Sister    Stroke Sister    Depression Sister    Heart disease Brother     Medications: Patient's Medications  New Prescriptions   No medications on file  Previous Medications   LEVOTHYROXINE (SYNTHROID) 50 MCG TABLET    TAKE 1 TABLET EVERY DAY BEFORE BREAKFAST   OLMESARTAN (BENICAR) 40 MG TABLET    TAKE 1 TABLET EVERY DAY FOR BLOOD PRESSURE   ROSUVASTATIN (CRESTOR) 40 MG TABLET    Take 1 tablet Daily for Cholesterol  Modified Medications   No medications on file  Discontinued Medications   BREXPIPRAZOLE (REXULTI) 1 MG TABS TABLET    Take 1 tablet (1 mg total) by mouth daily.    Physical Exam:  Vitals:   04/09/23 0917  BP: 130/88  Pulse: 77  Resp: 16  Temp: (!) 97.4 F (36.3 C)  SpO2: 97%  Weight: 124 lb 6.4 oz (56.4 kg)  Height: 5' (1.524 m)   Body mass index is 24.3 kg/m. Wt Readings from Last 3 Encounters:  04/09/23 124 lb 6.4 oz (56.4 kg)  01/05/23 119 lb (54 kg)  08/10/22 114 lb 3.2 oz (51.8 kg)    Physical Exam Constitutional:      General: She is not in acute distress.    Appearance: She is well-developed. She is not diaphoretic.  HENT:     Head: Normocephalic and atraumatic.     Mouth/Throat:     Pharynx: No oropharyngeal exudate.  Eyes:     Conjunctiva/sclera: Conjunctivae normal.     Pupils: Pupils are equal, round, and reactive to light.  Cardiovascular:     Rate and Rhythm: Normal rate and regular rhythm.     Heart sounds: Normal heart sounds.  Pulmonary:     Effort: Pulmonary effort is normal.     Breath sounds: Normal breath sounds.  Abdominal:     General: Bowel sounds are normal.     Palpations: Abdomen is soft.  Musculoskeletal:     Cervical back: Normal range of motion and neck supple.     Right lower leg: No edema.     Left lower leg: No edema.  Skin:    General: Skin is warm and dry.  Neurological:     Mental Status: She is alert.  Psychiatric:        Mood and Affect: Mood normal.      Labs reviewed: Basic Metabolic Panel: Recent Labs    04/13/22 1111 08/10/22 1116 01/05/23 0947  NA 144 140 144  K 4.2 4.6 4.3  CL 110 109 111*  CO2 26 24 22   GLUCOSE 92 89 94  BUN 18 23 21   CREATININE 1.51* 1.42* 1.72*  CALCIUM 9.4 9.3 9.8  MG 2.5  --  2.5  TSH 0.90 1.29 1.13   Liver Function Tests: Recent Labs    04/13/22 1111 08/10/22 1116 01/05/23 0947  AST 18 20 25   ALT  12 13 16   BILITOT 0.6 0.5 0.6  PROT 6.7 6.7 7.1   No results for input(s): "LIPASE", "AMYLASE" in the last 8760 hours. No results for input(s): "AMMONIA" in the last 8760 hours. CBC: Recent Labs    04/13/22 1111 08/10/22 1116 01/05/23 0947  WBC 7.0 9.0 8.3  NEUTROABS 4,389 5,823 5,503  HGB 13.3 12.8 12.6  HCT 41.8 40.4 40.2  MCV 87.6 88.6 87.6  PLT 165 188 166   Lipid Panel: Recent Labs    04/13/22 1111 08/10/22 1116 01/05/23 0947  CHOL 165 155 165  HDL 80 73 80  LDLCALC 67 66 69  TRIG 92 75 82  CHOLHDL 2.1 2.1 2.1   TSH: Recent Labs    04/13/22 1111 08/10/22 1116 01/05/23 0947  TSH 0.90 1.29 1.13   A1C: Lab Results  Component Value Date   HGBA1C 5.9 (H) 01/05/2023     Assessment/Plan Assessment and Plan    Dementia Nocturnal awakenings and wandering managed with non-pharmacological interventions due to polypharmacy and fall risk concerns. - Consider reintroducing melatonin for sleep maintenance. - Encourage consistent sleep schedule and non-pharmacological interventions.  Hypertension Blood pressure well-controlled despite not taking Benicar, suggesting possible overmedication or lifestyle factors. - Recheck blood pressure to confirm current readings. - Ensure Benicar prescription is filled and taken as prescribed.  Hypothyroidism Well-controlled on levothyroxine with normal thyroid function tests in December. - Continue levothyroxine 50 mcg daily.  Hyperlipidemia Cholesterol levels well-controlled with Crestor, LDL at 69 in December. - Continue  Crestor 40 mg daily.  Prediabetes A1c in prediabetic range, dietary habits include preference for sweets. Educated on aging impact on taste and nutritional value. - Encourage dietary modifications to reduce sugar intake and increase nutritional value of meals.  Vitamin D Deficiency Vitamin D supplementation status unclear. - Confirm current vitamin D supplementation with family. - Recommend vitamin D 2000 IU daily. - Check vitamin D level at next lab work.  Aortic atherosclerosis (HCC) Noted on imaging, on statin.   Follow-up Regular follow-up planned to monitor chronic conditions and adjust treatment. - Schedule annual wellness visit for January 11, 2024, as a virtual appointment. - Schedule follow-up appointment in July 2025 for routine check-up and lab work. - Perform lab work prior to July appointment to discuss results during the visit.     Return in about 3 months (around 07/09/2023) for routine follow up, labs prior to visit.:   Nikol Lemar K. Biagio Borg [facility] [phone]

## 2023-04-09 NOTE — Patient Instructions (Signed)
Vit d 2000 units daily

## 2023-04-15 ENCOUNTER — Ambulatory Visit: Payer: Medicare HMO | Admitting: Nurse Practitioner

## 2023-04-21 ENCOUNTER — Encounter: Payer: Self-pay | Admitting: Nurse Practitioner

## 2023-06-07 ENCOUNTER — Ambulatory Visit: Payer: Self-pay

## 2023-06-07 NOTE — Telephone Encounter (Signed)
 Message routed to Ulyses Gandy, NP due to PCP Roselie Conger Champ Coma, NP out of office.

## 2023-06-07 NOTE — Telephone Encounter (Signed)
 Discussed concerns of leg swelling with daughter Loetta Ringer. No known tenderness to extremity. Mild erythema present. No recent fall or injury. Family would prefer not to have in person visit. I advised they call and schedule video visit for tomorrow. Daughter appreciative of call and will discuss with husband.

## 2023-06-07 NOTE — Telephone Encounter (Signed)
 Noted

## 2023-06-07 NOTE — Telephone Encounter (Signed)
 Copied From CRM [id number]. Reason for Triage: Patient's daughter Stacy Wallace would like a call back at [phone]. Her mother Stacy Wallace) has been having some increased swelling in her right foot.   Chief Complaint: swelling foot Symptoms: right foot & ankle swelling Frequency: 1.5 weeks Pertinent Negatives: Patient denies pain Disposition: [] ED /[] Urgent Care (no appt availability in office) / [x] Appointment(In office/virtual)/ []  [facility]/ [] Home Care/ [] Refused Recommended Disposition /[] [facility]/ [x]  Follow-up with PCP Additional Notes: daughter called to inform pt has swelling to right foot and moving up to ankle: no appt available with PCP and daughter stated due to demenita pt should not be scheduled with a different provider.  Recommended elevation to help relieve s/s.daughter requesting information be sent to PCP and asked to work in pt to schedule possibly on Thursday and Friday'.  Please call back about appt status. Reason for Disposition  MILD or MODERATE ankle swelling (e.g., can't move joint normally, can't do usual activities) (Exceptions: Itchy, localized swelling; swelling is chronic.)  Answer Assessment - Initial Assessment Questions 1. LOCATION: "Which ankle is swollen?" "Where is the swelling?"     Right foot and ankle 2. ONSET: "When did the swelling start?"     1.5  week 3. SWELLING: "How bad is the swelling?" Or, "How large is it?" (e.g., mild, moderate, severe; size of localized swelling)    - NONE: No joint swelling.   - LOCALIZED: Localized; small area of puffy or swollen skin (e.g., insect bite, skin irritation).   - MILD: Joint looks or feels mildly swollen or puffy.   - MODERATE: Swollen; interferes with normal activities (e.g., work or school); decreased range of movement; may be limping.   - SEVERE: Very swollen; can't move swollen joint at all; limping a lot or unable to walk.     moderate 4. PAIN: "Is there any pain?" If Yes, ask: "How bad  is it?" (Scale 1-10; or mild, moderate, severe)   - NONE (0): no pain.   - MILD (1-3): doesn't interfere with normal activities.    - MODERATE (4-7): interferes with normal activities (e.g., work or school) or awakens from sleep, limping.    - SEVERE (8-10): excruciating pain, unable to do any normal activities, unable to walk.      none 5. CAUSE: "What do you think caused the ankle swelling?"     unknown 6. OTHER SYMPTOMS: "Do you have any other symptoms?" (e.g., fever, chest pain, difficulty breathing, calf pain)     no 7. PREGNANCY: "Is there any chance you are pregnant?" "When was your last menstrual period?"     N/a  Protocols used: Ankle Swelling-A-AH

## 2023-06-08 ENCOUNTER — Encounter: Payer: Self-pay | Admitting: Family

## 2023-06-08 ENCOUNTER — Telehealth: Admitting: Family

## 2023-06-08 VITALS — Ht 60.0 in | Wt 124.0 lb

## 2023-06-08 DIAGNOSIS — R600 Localized edema: Secondary | ICD-10-CM

## 2023-06-08 MED ORDER — HYDROCHLOROTHIAZIDE 12.5 MG PO TABS
12.5000 mg | ORAL_TABLET | Freq: Every day | ORAL | 1 refills | Status: DC
Start: 2023-06-08 — End: 2023-08-04

## 2023-06-08 NOTE — Progress Notes (Signed)
 I connected with  Stacy Wallace on 06/08/23 by a video and audio enabled telemedicine application and verified that I am speaking with the correct person using two identifiers.  Patient Location: Home  Provider Location: Office/Clinic  I discussed the limitations of evaluation and management by telemedicine. The patient expressed understanding and agreed to proceed.

## 2023-06-08 NOTE — Patient Instructions (Signed)
-   keep legs elevated when seated  - Avoid adding extra salt to food  - knee high compression stockings on in the morning and off at bedtime  - start on Hydrochlorothiazide  12.5 mg tablet one by mouth daily  - Please schedule lab appointment to check BMP in 2 weeks

## 2023-06-08 NOTE — Progress Notes (Signed)
 Provider: Lacora Folmer FNP-C  Verma Gobble, NP  Patient Care Team: Verma Gobble, NP as PCP - General (Geriatric Medicine)  Extended Emergency Contact Information Primary Emergency Contact: Wolpert,Charles Address: [address]          [place], [place] [address] [place] Home Phone: [phone] Mobile Phone: [phone] Relation: Spouse Secondary Emergency Contact: Whisenant,kelly Mobile Phone: [phone] Relation: Daughter  Code Status:  Full Code  Goals of care: Advanced Directive information    04/09/2023    9:26 AM  Advanced Directives  Does Patient Have a Medical Advance Directive? Yes  Type of Estate agent of [place];Living will;Out of facility DNR (pink MOST or yellow form)  Does patient want to make changes to medical advance directive? No - Patient declined  Copy of Healthcare Power of Attorney in Chart? No - copy requested     Chief Complaint  Patient presents with   Foot Swelling    Foot right swelling and pain  ,  using compression and elevation  , she notice that is red and that  , she notice that today that swelling has gone down after wearing compression garments  , she said that this hasn't happen before, she is able to bare weight on this .    HPI:  Pt is a 78 y.o. female seen today for an acute visit for evaluation of  bilateral lower extremity edema.she is seen with assistance from her daughter and Husband.HPI provided by patient's daughter.States right leg swelling started yesterday then moved to left leg today.Had no redness or warm to touch but left leg seems to be slightly red on shin area today.Both legs were sore to touch when trying to elevate. No cough or shortness of breath. She applied compression yesterday but not today.  States used to take diuretic several years ago but was taken off.  No medical history of congestive Heart failure.Has significant history of CKD stage 3  Husband states SBP  was 154 yesterday.   Past Medical History:  Diagnosis Date   GERD (gastroesophageal reflux disease)    Hyperlipidemia    Hypertension    Low kidney function    Other abnormal glucose    Prediabetes    Unspecified hypothyroidism    Unspecified vitamin D  deficiency    Past Surgical History:  Procedure Laterality Date   ABDOMINAL HYSTERECTOMY     CARPAL TUNNEL RELEASE Bilateral 2001   EYE SURGERY     NASAL SEPTUM SURGERY     TUBAL LIGATION      Allergies  Allergen Reactions   Lopid [Gemfibrozil]     headache   Morphine And Codeine Other (See Comments)    Reaction: unknown    Nickel Other (See Comments)    Reaction: unknown    Xanax  [Alprazolam ]    Latex Rash    Outpatient Encounter Medications as of 06/08/2023  Medication Sig   levothyroxine  (SYNTHROID ) 50 MCG tablet TAKE 1 TABLET EVERY DAY BEFORE BREAKFAST   olmesartan  (BENICAR ) 40 MG tablet TAKE 1 TABLET EVERY DAY FOR BLOOD PRESSURE   rosuvastatin  (CRESTOR ) 40 MG tablet Take 1 tablet Daily for Cholesterol   No facility-administered encounter medications on file as of 06/08/2023.    Review of Systems  Constitutional:  Negative for appetite change, chills, fatigue, fever and unexpected weight change.  HENT:  Negative for congestion, ear discharge, ear pain, rhinorrhea, sinus pressure, sinus pain, sneezing and sore throat.   Respiratory:  Negative for cough,  chest tightness, shortness of breath and wheezing.   Cardiovascular:  Positive for leg swelling. Negative for chest pain and palpitations.  Gastrointestinal:  Negative for abdominal distention, abdominal pain, nausea and vomiting.  Genitourinary:  Negative for difficulty urinating, dysuria, flank pain, frequency and urgency.  Skin:  Negative for color change, pallor, rash and wound.    Immunization History  Administered Date(s) Administered   Fluad Trivalent(High Dose 65+) 10/14/2022   Influenza, High Dose Seasonal PF 09/19/2013, 10/16/2014, 10/08/2015, 11/30/2016,  10/06/2021   Influenza-Unspecified 09/15/2012, 11/05/2016, 11/05/2017, 10/06/2019, 11/13/2020   Pneumococcal Conjugate-13 08/08/2015   Pneumococcal-Unspecified 03/18/2010   Td 11/09/2006   Pertinent  Health Maintenance Due  Topic Date Due   INFLUENZA VACCINE  08/06/2023   DEXA SCAN  Completed   Colonoscopy  Discontinued      02/29/2020    3:37 PM 03/03/2021    3:50 PM 04/13/2022   10:54 AM 04/09/2023    9:26 AM 06/08/2023   11:18 AM  Fall Risk  Falls in the past year? 0 0 0 0 0  Was there an injury with Fall? 0 0 0 0 0  Fall Risk Category Calculator 0 0 0 0 0  Fall Risk Category (Retired) Low Low     (RETIRED) Patient Fall Risk Level Low fall risk Low fall risk     Patient at Risk for Falls Due to History of fall(s) Mental status change No Fall Risks No Fall Risks No Fall Risks  Fall risk Follow up Falls evaluation completed;Falls prevention discussed Falls evaluation completed;Falls prevention discussed Falls evaluation completed;Falls prevention discussed Falls evaluation completed Falls evaluation completed   Functional Status Survey:    Vitals:   06/08/23 1116  Weight: 124 lb (56.2 kg)  Height: 5' (1.524 m)   Body mass index is 24.22 kg/m. Physical Exam Constitutional:      General: She is not in acute distress.    Appearance: She is not ill-appearing.  Pulmonary:     Effort: Pulmonary effort is normal. No respiratory distress.  Musculoskeletal:     Right lower leg: Edema present.     Left lower leg: Edema present.  Neurological:     Mental Status: She is alert.  Psychiatric:        Mood and Affect: Mood normal.        Behavior: Behavior normal.     Labs reviewed: Recent Labs    08/10/22 1116 01/05/23 0947  NA 140 144  K 4.6 4.3  CL 109 111*  CO2 24 22  GLUCOSE 89 94  BUN 23 21  CREATININE 1.42* 1.72*  CALCIUM  9.3 9.8  MG  --  2.5   Recent Labs    08/10/22 1116 01/05/23 0947  AST 20 25  ALT 13 16  BILITOT 0.5 0.6  PROT 6.7 7.1   Recent Labs     08/10/22 1116 01/05/23 0947  WBC 9.0 8.3  NEUTROABS 5,823 5,503  HGB 12.8 12.6  HCT 40.4 40.2  MCV 88.6 87.6  PLT 188 166   Lab Results  Component Value Date   TSH 1.13 01/05/2023   Lab Results  Component Value Date   HGBA1C 5.9 (H) 01/05/2023   Lab Results  Component Value Date   CHOL 165 01/05/2023   HDL 80 01/05/2023   LDLCALC 69 01/05/2023   TRIG 82 01/05/2023   CHOLHDL 2.1 01/05/2023    Significant Diagnostic Results in last 30 days:  No results found.  Assessment/Plan  Edema of both lower extremities (  Primary) Bilateral lower extremities pitting edema  - start on Hydrochlorothiazide  12.5 mg daily  - No signs of fluid overload  - Keep legs elevated when seated to keep swelling down  - Knee high compression stockings on in the morning and off at bedtime  - schedule lab appointment to check  BMP in 2 weeks - Basic Metabolic Panel with eGFR; Future - hydrochlorothiazide  (HYDRODIURIL ) 12.5 MG tablet; Take 1 tablet (12.5 mg total) by mouth daily.  Dispense: 90 tablet; Refill: 1  Family/ staff Communication: Reviewed plan of care with patient and daughter verbalized understanding   Labs/tests ordered: - Basic Metabolic Panel with eGFR; Future  Next Appointment: Return in about 2 weeks (around 06/22/2023) for check BMP .  I connected with  INDIYA IZQUIERDO on 06/08/23 by a video enabled telemedicine application and verified that I am speaking with the correct person using two identifiers.   I discussed the limitations of evaluation and management by telemedicine. The patient expressed understanding and agreed to proceed.   Spent 20 minutes of face to face on video with patient  >50% time spent counseling; reviewing medical record; tests; labs; and developing future plan of care.     Estil Heman, NP

## 2023-06-22 ENCOUNTER — Other Ambulatory Visit

## 2023-06-22 DIAGNOSIS — I10 Essential (primary) hypertension: Secondary | ICD-10-CM

## 2023-06-22 DIAGNOSIS — R739 Hyperglycemia, unspecified: Secondary | ICD-10-CM

## 2023-06-22 DIAGNOSIS — R600 Localized edema: Secondary | ICD-10-CM

## 2023-06-22 DIAGNOSIS — E782 Mixed hyperlipidemia: Secondary | ICD-10-CM

## 2023-06-22 DIAGNOSIS — E559 Vitamin D deficiency, unspecified: Secondary | ICD-10-CM

## 2023-06-22 DIAGNOSIS — D649 Anemia, unspecified: Secondary | ICD-10-CM | POA: Diagnosis not present

## 2023-06-23 ENCOUNTER — Ambulatory Visit: Payer: Self-pay | Admitting: Nurse Practitioner

## 2023-06-23 NOTE — Telephone Encounter (Signed)
 Noted

## 2023-06-23 NOTE — Telephone Encounter (Signed)
 Copied from CRM [id number]. Topic: Clinical - Lab/Test Results >> Jun 23, 2023  2:51 PM Ary Bitter R wrote: Reason for CRM: Pt called back and results were relayed. No further questions at this time.

## 2023-06-25 LAB — CBC WITH DIFFERENTIAL/PLATELET
Absolute Lymphocytes: 2394 {cells}/uL (ref 850–3900)
Absolute Monocytes: 713 {cells}/uL (ref 200–950)
Basophils Absolute: 16 {cells}/uL (ref 0–200)
Basophils Relative: 0.2 %
Eosinophils Absolute: 90 {cells}/uL (ref 15–500)
Eosinophils Relative: 1.1 %
HCT: 36.1 % (ref 35.0–45.0)
Hemoglobin: 10.7 g/dL — ABNORMAL LOW (ref 11.7–15.5)
MCH: 23.9 pg — ABNORMAL LOW (ref 27.0–33.0)
MCHC: 29.6 g/dL — ABNORMAL LOW (ref 32.0–36.0)
MCV: 80.8 fL (ref 80.0–100.0)
MPV: 9.9 fL (ref 7.5–12.5)
Monocytes Relative: 8.7 %
Neutro Abs: 4986 {cells}/uL (ref 1500–7800)
Neutrophils Relative %: 60.8 %
Platelets: 231 10*3/uL (ref 140–400)
RBC: 4.47 10*6/uL (ref 3.80–5.10)
RDW: 16.1 % — ABNORMAL HIGH (ref 11.0–15.0)
Total Lymphocyte: 29.2 %
WBC: 8.2 10*3/uL (ref 3.8–10.8)

## 2023-06-25 LAB — LIPID PANEL
Cholesterol: 244 mg/dL — ABNORMAL HIGH (ref <200)
HDL: 69 mg/dL (ref >=50)
LDL Cholesterol (Calc): 152 mg/dL — ABNORMAL HIGH
Non-HDL Cholesterol (Calc): 175 mg/dL — ABNORMAL HIGH (ref <130)
Total CHOL/HDL Ratio: 3.5 (calc) (ref <5.0)
Triglycerides: 116 mg/dL (ref <150)

## 2023-06-25 LAB — HEMOGLOBIN A1C
Hgb A1c MFr Bld: 5.9 % — ABNORMAL HIGH (ref <5.7)
Mean Plasma Glucose: 123 mg/dL
eAG (mmol/L): 6.8 mmol/L

## 2023-06-25 LAB — COMPLETE METABOLIC PANEL WITHOUT GFR
AG Ratio: 1.6 (calc) (ref 1.0–2.5)
ALT: 7 U/L (ref 6–29)
AST: 17 U/L (ref 10–35)
Albumin: 4.2 g/dL (ref 3.6–5.1)
Alkaline phosphatase (APISO): 68 U/L (ref 37–153)
BUN/Creatinine Ratio: 17 (calc) (ref 6–22)
BUN: 22 mg/dL (ref 7–25)
CO2: 27 mmol/L (ref 20–32)
Calcium: 9.8 mg/dL (ref 8.6–10.4)
Chloride: 102 mmol/L (ref 98–110)
Creat: 1.32 mg/dL — ABNORMAL HIGH (ref 0.60–0.95)
Globulin: 2.6 g/dL (ref 1.9–3.7)
Glucose, Bld: 103 mg/dL — ABNORMAL HIGH (ref 65–99)
Potassium: 4.2 mmol/L (ref 3.5–5.3)
Sodium: 138 mmol/L (ref 135–146)
Total Bilirubin: 0.4 mg/dL (ref 0.2–1.2)
Total Protein: 6.8 g/dL (ref 6.1–8.1)

## 2023-06-25 LAB — TEST AUTHORIZATION

## 2023-06-25 LAB — IRON,TIBC AND FERRITIN PANEL
%SAT: 4 % — ABNORMAL LOW (ref 16–45)
Ferritin: 33 ng/mL (ref 16–288)
Iron: 20 ug/dL — ABNORMAL LOW (ref 45–160)
TIBC: 450 ug/dL (ref 250–450)

## 2023-06-25 LAB — VITAMIN D 25 HYDROXY (VIT D DEFICIENCY, FRACTURES): Vit D, 25-Hydroxy: >150 ng/mL — ABNORMAL HIGH (ref 30–100)

## 2023-06-29 ENCOUNTER — Other Ambulatory Visit: Payer: Self-pay

## 2023-06-29 DIAGNOSIS — E782 Mixed hyperlipidemia: Secondary | ICD-10-CM

## 2023-06-29 DIAGNOSIS — E039 Hypothyroidism, unspecified: Secondary | ICD-10-CM

## 2023-06-29 DIAGNOSIS — I10 Essential (primary) hypertension: Secondary | ICD-10-CM

## 2023-07-05 ENCOUNTER — Ambulatory Visit: Payer: Medicare HMO | Admitting: Nurse Practitioner

## 2023-07-12 ENCOUNTER — Other Ambulatory Visit

## 2023-07-26 ENCOUNTER — Encounter: Payer: Self-pay | Admitting: Nurse Practitioner

## 2023-07-26 ENCOUNTER — Ambulatory Visit (INDEPENDENT_AMBULATORY_CARE_PROVIDER_SITE_OTHER): Admitting: Nurse Practitioner

## 2023-07-26 VITALS — BP 100/70 | HR 80 | Temp 97.4°F | Resp 13 | Ht 60.0 in | Wt 119.4 lb

## 2023-07-26 DIAGNOSIS — E039 Hypothyroidism, unspecified: Secondary | ICD-10-CM

## 2023-07-26 DIAGNOSIS — I10 Essential (primary) hypertension: Secondary | ICD-10-CM | POA: Diagnosis not present

## 2023-07-26 DIAGNOSIS — F02818 Dementia in other diseases classified elsewhere, unspecified severity, with other behavioral disturbance: Secondary | ICD-10-CM

## 2023-07-26 DIAGNOSIS — R739 Hyperglycemia, unspecified: Secondary | ICD-10-CM | POA: Diagnosis not present

## 2023-07-26 DIAGNOSIS — D509 Iron deficiency anemia, unspecified: Secondary | ICD-10-CM | POA: Diagnosis not present

## 2023-07-26 DIAGNOSIS — G309 Alzheimer's disease, unspecified: Secondary | ICD-10-CM

## 2023-07-26 DIAGNOSIS — R600 Localized edema: Secondary | ICD-10-CM | POA: Diagnosis not present

## 2023-07-26 DIAGNOSIS — N1832 Chronic kidney disease, stage 3b: Secondary | ICD-10-CM | POA: Diagnosis not present

## 2023-07-26 DIAGNOSIS — E782 Mixed hyperlipidemia: Secondary | ICD-10-CM | POA: Diagnosis not present

## 2023-07-26 LAB — COMPREHENSIVE METABOLIC PANEL WITH GFR
AG Ratio: 1.4 (calc) (ref 1.0–2.5)
ALT: 20 U/L (ref 6–29)
AST: 63 U/L — ABNORMAL HIGH (ref 10–35)
Albumin: 4.2 g/dL (ref 3.6–5.1)
Alkaline phosphatase (APISO): 64 U/L (ref 37–153)
BUN/Creatinine Ratio: 16 (calc) (ref 6–22)
BUN: 43 mg/dL — ABNORMAL HIGH (ref 7–25)
CO2: 25 mmol/L (ref 20–32)
Calcium: 9.8 mg/dL (ref 8.6–10.4)
Chloride: 102 mmol/L (ref 98–110)
Creat: 2.72 mg/dL — ABNORMAL HIGH (ref 0.60–0.95)
Globulin: 2.9 g/dL (ref 1.9–3.7)
Glucose, Bld: 94 mg/dL (ref 65–99)
Potassium: 3.6 mmol/L (ref 3.5–5.3)
Sodium: 138 mmol/L (ref 135–146)
Total Bilirubin: 0.4 mg/dL (ref 0.2–1.2)
Total Protein: 7.1 g/dL (ref 6.1–8.1)
eGFR: 17 mL/min/1.73m2 — ABNORMAL LOW (ref >=60)

## 2023-07-26 LAB — CBC WITH DIFFERENTIAL/PLATELET
Absolute Lymphocytes: 2299 {cells}/uL (ref 850–3900)
Absolute Monocytes: 893 {cells}/uL (ref 200–950)
Basophils Absolute: 29 {cells}/uL (ref 0–200)
Basophils Relative: 0.3 %
Eosinophils Absolute: 67 {cells}/uL (ref 15–500)
Eosinophils Relative: 0.7 %
HCT: 36.5 % (ref 35.0–45.0)
Hemoglobin: 10.9 g/dL — ABNORMAL LOW (ref 11.7–15.5)
MCH: 23.4 pg — ABNORMAL LOW (ref 27.0–33.0)
MCHC: 29.9 g/dL — ABNORMAL LOW (ref 32.0–36.0)
MCV: 78.3 fL — ABNORMAL LOW (ref 80.0–100.0)
MPV: 10.2 fL (ref 7.5–12.5)
Monocytes Relative: 9.4 %
Neutro Abs: 6213 {cells}/uL (ref 1500–7800)
Neutrophils Relative %: 65.4 %
Platelets: 195 Thousand/uL (ref 140–400)
RBC: 4.66 Million/uL (ref 3.80–5.10)
RDW: 16.7 % — ABNORMAL HIGH (ref 11.0–15.0)
Total Lymphocyte: 24.2 %
WBC: 9.5 Thousand/uL (ref 3.8–10.8)

## 2023-07-26 LAB — LIPID PANEL
Cholesterol: 130 mg/dL (ref <200)
HDL: 61 mg/dL (ref >=50)
LDL Cholesterol (Calc): 53 mg/dL
Non-HDL Cholesterol (Calc): 69 mg/dL (ref <130)
Total CHOL/HDL Ratio: 2.1 (calc) (ref <5.0)
Triglycerides: 82 mg/dL (ref <150)

## 2023-07-26 NOTE — Progress Notes (Signed)
 "   Careteam: Patient Care Team: Caro Harlene POUR, NP as PCP - General (Geriatric Medicine)  PLACE OF SERVICE:  [facility]  Advanced Directive information    Allergies  Allergen Reactions   Lopid [Gemfibrozil]     headache   Morphine And Codeine Other (See Comments)    Reaction: unknown    Nickel Other (See Comments)    Reaction: unknown    Xanax  [Alprazolam ]    Latex Rash    Chief Complaint  Patient presents with   Medical Management of Chronic Issues    3 month follow up.     HPI:  Discussed the use of AI scribe software for clinical note transcription with the patient, who gave verbal consent to proceed.  History of Present Illness Stacy Wallace is an 78 year old female with chronic kidney disease who presents for a three-month follow-up.  She has been taking crestor - no side effects  She has experienced low iron levels and hemoglobin. She is not currently taking any iron supplements, although she had taken them in the past. Her diet is reported to be good, with adequate intake of vegetables, proteins, and meats.  She has lost five pounds since her last visit, attributed to cutting out sweets from her diet due to concerns about sugar intake.   she has been taking a diuretic for about a month due to swelling in her legs Continues to have some LE edema.   She has a history of dementia, and her family reports that she seems 'spaced out' and is not talking much, raising concerns about her current cognitive state and that she has been on a slow decline since stopping previous medications for dementia.  No blood in the stool and regular bowel movements. Noted to have low bp but no dizziness despite low blood pressure readings.   Review of Systems:  Review of Systems  Unable to perform ROS: Dementia    Past Medical History:  Diagnosis Date   GERD (gastroesophageal reflux disease)    Hyperlipidemia    Hypertension    Low kidney function    Other abnormal  glucose    Prediabetes    Unspecified hypothyroidism    Unspecified vitamin D  deficiency    Past Surgical History:  Procedure Laterality Date   ABDOMINAL HYSTERECTOMY     CARPAL TUNNEL RELEASE Bilateral 2001   EYE SURGERY     NASAL SEPTUM SURGERY     TUBAL LIGATION     Social History:   reports that she quit smoking about 35 years ago. Her smoking use included cigarettes. She has never used smokeless tobacco. She reports current alcohol use. No history on file for drug use.  Family History  Problem Relation Age of Onset   Hypertension Mother    Hyperlipidemia Mother    Emphysema Mother    Heart disease Father    Alcohol abuse Father    Hyperlipidemia Father    Hypertension Father    Heart disease Sister    Stroke Sister    Depression Sister    Heart disease Brother     Medications: Patient's Medications  New Prescriptions   No medications on file  Previous Medications   HYDROCHLOROTHIAZIDE  (HYDRODIURIL ) 12.5 MG TABLET    Take 1 tablet (12.5 mg total) by mouth daily.   LEVOTHYROXINE  (SYNTHROID ) 50 MCG TABLET    TAKE 1 TABLET EVERY DAY BEFORE BREAKFAST   OLMESARTAN  (BENICAR ) 40 MG TABLET    TAKE 1 TABLET EVERY DAY  FOR BLOOD PRESSURE   ROSUVASTATIN  (CRESTOR ) 40 MG TABLET    Take 1 tablet Daily for Cholesterol  Modified Medications   No medications on file  Discontinued Medications   No medications on file    Physical Exam:  Vitals:   07/26/23 0905  BP: 100/70  Pulse: 80  Resp: 13  Temp: (!) 97.4 F (36.3 C)  SpO2: 98%  Weight: 119 lb 6.4 oz (54.2 kg)  Height: 5' (1.524 m)   Body mass index is 23.32 kg/m. Wt Readings from Last 3 Encounters:  07/26/23 119 lb 6.4 oz (54.2 kg)  06/08/23 124 lb (56.2 kg)  04/09/23 124 lb 6.4 oz (56.4 kg)    Physical Exam Constitutional:      General: She is not in acute distress.    Appearance: She is well-developed. She is not diaphoretic.  HENT:     Head: Normocephalic and atraumatic.     Mouth/Throat:     Pharynx:  No oropharyngeal exudate.  Eyes:     Conjunctiva/sclera: Conjunctivae normal.     Pupils: Pupils are equal, round, and reactive to light.  Cardiovascular:     Rate and Rhythm: Normal rate and regular rhythm.     Heart sounds: Normal heart sounds.  Pulmonary:     Effort: Pulmonary effort is normal.     Breath sounds: Normal breath sounds.  Abdominal:     General: Bowel sounds are normal.     Palpations: Abdomen is soft.  Musculoskeletal:     Cervical back: Normal range of motion and neck supple.     Right lower leg: No edema.     Left lower leg: No edema.  Skin:    General: Skin is warm and dry.  Neurological:     Mental Status: She is alert.  Psychiatric:        Mood and Affect: Mood normal.     Labs reviewed: Basic Metabolic Panel: Recent Labs    08/10/22 1116 01/05/23 0947 06/22/23 0911  NA 140 144 138  K 4.6 4.3 4.2  CL 109 111* 102  CO2 24 22 27   GLUCOSE 89 94 103*  BUN 23 21 22   CREATININE 1.42* 1.72* 1.32*  CALCIUM  9.3 9.8 9.8  MG  --  2.5  --   TSH 1.29 1.13  --    Liver Function Tests: Recent Labs    08/10/22 1116 01/05/23 0947 06/22/23 0911  AST 20 25 17   ALT 13 16 7   BILITOT 0.5 0.6 0.4  PROT 6.7 7.1 6.8   No results for input(s): LIPASE, AMYLASE in the last 8760 hours. No results for input(s): AMMONIA in the last 8760 hours. CBC: Recent Labs    08/10/22 1116 01/05/23 0947 06/22/23 0911  WBC 9.0 8.3 8.2  NEUTROABS 5,823 5,503 4,986  HGB 12.8 12.6 10.7*  HCT 40.4 40.2 36.1  MCV 88.6 87.6 80.8  PLT 188 166 231   Lipid Panel: Recent Labs    08/10/22 1116 01/05/23 0947 06/22/23 0911  CHOL 155 165 244*  HDL 73 80 69  LDLCALC 66 69 152*  TRIG 75 82 116  CHOLHDL 2.1 2.1 3.5   TSH: Recent Labs    08/10/22 1116 01/05/23 0947  TSH 1.29 1.13   A1C: Lab Results  Component Value Date   HGBA1C 5.9 (H) 06/22/2023     Assessment/Plan  Iron deficiency anemia, unspecified iron deficiency anemia type Assessment &  Plan: Not currently on supplement, may need to restart Follow upcbc  Hypothyroidism, unspecified type Assessment & Plan: Tsh at goal in Dec Continue synthroid  50 mcg   Essential hypertension Assessment & Plan: Stop hydrochlorothiazide  at this time Monitor bp at home  Orders: -     CBC with Differential/Platelet -     Lipid panel  Hyperlipidemia, mixed Assessment & Plan: Has restarted crestor  without issues, will follow up labs   Orders: -     Lipid panel -     Comprehensive metabolic panel with GFR  Hyperglycemia Assessment & Plan: Continue dietary modifications    Alzheimer's dementia with behavioral disturbance (HCC) Assessment & Plan: Progressive decline, continue support with family   CKD stage G3b/A2, 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  Return in about 3 months (around 10/26/2023) for routine follow up.  Zackory Pudlo K. Caro BODILY [facility] [phone]  "

## 2023-07-26 NOTE — Patient Instructions (Signed)
 Check blood pressure at home daily in the morning after medication  (Okay to do 3-4 times weekly) STOP hydrochlorothiazide   Continue olmesartan 

## 2023-07-27 ENCOUNTER — Ambulatory Visit: Payer: Self-pay | Admitting: Nurse Practitioner

## 2023-07-27 DIAGNOSIS — E782 Mixed hyperlipidemia: Secondary | ICD-10-CM

## 2023-07-27 DIAGNOSIS — I10 Essential (primary) hypertension: Secondary | ICD-10-CM

## 2023-07-28 DIAGNOSIS — R739 Hyperglycemia, unspecified: Secondary | ICD-10-CM | POA: Insufficient documentation

## 2023-07-28 DIAGNOSIS — D509 Iron deficiency anemia, unspecified: Secondary | ICD-10-CM | POA: Insufficient documentation

## 2023-07-28 NOTE — Assessment & Plan Note (Signed)
 Not currently on supplement, may need to restart Follow upcbc

## 2023-07-28 NOTE — Assessment & Plan Note (Signed)
 Progressive decline, continue support with family

## 2023-07-28 NOTE — Assessment & Plan Note (Signed)
 Stop hydrochlorothiazide  at this time Monitor bp at home

## 2023-07-28 NOTE — Assessment & Plan Note (Signed)
 Has restarted crestor  without issues, will follow up labs

## 2023-07-28 NOTE — Assessment & Plan Note (Signed)
 Tsh at goal in Dec Continue synthroid  50 mcg

## 2023-07-28 NOTE — Assessment & Plan Note (Signed)
 Encourage proper hydration Follow metabolic panel Avoid nephrotoxic meds (NSAIDS)

## 2023-07-28 NOTE — Assessment & Plan Note (Signed)
 Continue dietary modifications

## 2023-08-03 ENCOUNTER — Other Ambulatory Visit

## 2023-08-03 DIAGNOSIS — E782 Mixed hyperlipidemia: Secondary | ICD-10-CM | POA: Diagnosis not present

## 2023-08-03 DIAGNOSIS — I10 Essential (primary) hypertension: Secondary | ICD-10-CM

## 2023-08-04 ENCOUNTER — Ambulatory Visit: Payer: Self-pay | Admitting: Nurse Practitioner

## 2023-08-04 LAB — BASIC METABOLIC PANEL WITHOUT GFR
BUN/Creatinine Ratio: 15 (calc) (ref 6–22)
BUN: 26 mg/dL — ABNORMAL HIGH (ref 7–25)
CO2: 24 mmol/L (ref 20–32)
Calcium: 9.4 mg/dL (ref 8.6–10.4)
Chloride: 110 mmol/L (ref 98–110)
Creat: 1.75 mg/dL — ABNORMAL HIGH (ref 0.60–0.95)
Glucose, Bld: 86 mg/dL (ref 65–99)
Potassium: 3.6 mmol/L (ref 3.5–5.3)
Sodium: 142 mmol/L (ref 135–146)

## 2023-09-27 ENCOUNTER — Other Ambulatory Visit

## 2023-10-25 ENCOUNTER — Encounter: Payer: Self-pay | Admitting: Nurse Practitioner

## 2023-10-25 ENCOUNTER — Ambulatory Visit (INDEPENDENT_AMBULATORY_CARE_PROVIDER_SITE_OTHER): Admitting: Nurse Practitioner

## 2023-10-25 VITALS — BP 128/86 | HR 70 | Temp 97.5°F | Ht 60.0 in | Wt 119.4 lb

## 2023-10-25 DIAGNOSIS — G309 Alzheimer's disease, unspecified: Secondary | ICD-10-CM | POA: Diagnosis not present

## 2023-10-25 DIAGNOSIS — N1832 Chronic kidney disease, stage 3b: Secondary | ICD-10-CM

## 2023-10-25 DIAGNOSIS — R739 Hyperglycemia, unspecified: Secondary | ICD-10-CM

## 2023-10-25 DIAGNOSIS — F02818 Dementia in other diseases classified elsewhere, unspecified severity, with other behavioral disturbance: Secondary | ICD-10-CM | POA: Diagnosis not present

## 2023-10-25 DIAGNOSIS — D509 Iron deficiency anemia, unspecified: Secondary | ICD-10-CM | POA: Diagnosis not present

## 2023-10-25 DIAGNOSIS — I10 Essential (primary) hypertension: Secondary | ICD-10-CM | POA: Diagnosis not present

## 2023-10-25 DIAGNOSIS — E039 Hypothyroidism, unspecified: Secondary | ICD-10-CM | POA: Diagnosis not present

## 2023-10-25 NOTE — Assessment & Plan Note (Signed)
 Fluctuating kidney function, likely sensitive to hydration status.  - Encourage adequate hydration to maintain kidney function. - Monitor kidney function with blood work. - Consider referral to nephrology if kidney function continues to decline.

## 2023-10-25 NOTE — Assessment & Plan Note (Signed)
 Continue dietary modifications  Follow up a1c

## 2023-10-25 NOTE — Assessment & Plan Note (Signed)
 Continues on iron supplement Follow cbc

## 2023-10-25 NOTE — Assessment & Plan Note (Signed)
 Tsh at goal in Dec Continue synthroid  50 mcg

## 2023-10-25 NOTE — Progress Notes (Signed)
 Careteam: Patient Care Team: Caro Harlene POUR, NP as PCP - General (Geriatric Medicine)  PLACE OF SERVICE:  [facility]  Advanced Directive information    Allergies  Allergen Reactions   Lopid [Gemfibrozil]     headache   Morphine And Codeine Other (See Comments)    Reaction: unknown    Nickel Other (See Comments)    Reaction: unknown    Xanax  [Alprazolam ]    Latex Rash    Chief Complaint  Patient presents with   Medical Management of Chronic Issues    3 month routine follow up  Patient daugthwre stated that patient wouldn't be getting any vaccines.     HPI:  Discussed the use of AI scribe software for clinical note transcription with the patient, who gave verbal consent to proceed.  History of Present Illness Stacy Wallace is an 78 year old female with anemia and fluctuating kidney function who presents for a three-month follow-up.  She has been taking iron supplements since her last visit to address anemia. No signs of blood loss, such as melena, have been observed.   She is currently taking Benicar  (olmesartan ) 40 mg daily for hypertension.   She is also on Synthroid  50 mcg daily for thyroid  management, with thyroid  levels last checked in December.   Additionally, she takes rosuvastatin  (Crestor ) for hyperlipidemia, with levels last checked over the summer and were at goal.  Her weight has remained stable at 119 pounds since July. She has no issues with appetite and is described as a good eater.   No anxiety or depression, though there are sporadic mood changes. She experiences occasional sleep disturbances, waking up in the middle of the night, but manages these with lifestyle adjustments.  There are concerns about cognitive function, worsening dementia and worsening of confusion and difficulty with comprehension since the summer. She has had episodes of incontinence and requires assistance with bathing and dressing. Her mobility is slow, raising concerns  about potential falls. She can still feed herself, but there is anticipation of further decline in her abilities.  No recent falls, constipation, or changes in bowel movements. No anxiety or depression, and sleep is generally good, although she occasionally wakes up at odd hours.   Review of Systems:  Review of Systems  Unable to perform ROS: Dementia    Past Medical History:  Diagnosis Date   GERD (gastroesophageal reflux disease)    Hyperlipidemia    Hypertension    Low kidney function    Other abnormal glucose    Prediabetes    Unspecified hypothyroidism    Unspecified vitamin D  deficiency    Past Surgical History:  Procedure Laterality Date   ABDOMINAL HYSTERECTOMY     CARPAL TUNNEL RELEASE Bilateral 2001   EYE SURGERY     NASAL SEPTUM SURGERY     TUBAL LIGATION     Social History:   reports that she quit smoking about 35 years ago. Her smoking use included cigarettes. She has never used smokeless tobacco. She reports current alcohol use. No history on file for drug use.  Family History  Problem Relation Age of Onset   Hypertension Mother    Hyperlipidemia Mother    Emphysema Mother    Heart disease Father    Alcohol abuse Father    Hyperlipidemia Father    Hypertension Father    Heart disease Sister    Stroke Sister    Depression Sister    Heart disease Brother  Medications: Patient's Medications  New Prescriptions   No medications on file  Previous Medications   LEVOTHYROXINE  (SYNTHROID ) 50 MCG TABLET    TAKE 1 TABLET EVERY DAY BEFORE BREAKFAST   OLMESARTAN  (BENICAR ) 40 MG TABLET    TAKE 1 TABLET EVERY DAY FOR BLOOD PRESSURE   ROSUVASTATIN  (CRESTOR ) 40 MG TABLET    Take 1 tablet Daily for Cholesterol  Modified Medications   No medications on file  Discontinued Medications   No medications on file    Physical Exam:  Vitals:   10/25/23 1144  BP: 128/86  Pulse: 70  Temp: (!) 97.5 F (36.4 C)  SpO2: 98%  Weight: 119 lb 6.4 oz (54.2 kg)   Height: 5' (1.524 m)   Body mass index is 23.32 kg/m. Wt Readings from Last 3 Encounters:  10/25/23 119 lb 6.4 oz (54.2 kg)  07/26/23 119 lb 6.4 oz (54.2 kg)  06/08/23 124 lb (56.2 kg)    Physical Exam Constitutional:      General: She is not in acute distress.    Appearance: She is well-developed. She is not diaphoretic.  HENT:     Head: Normocephalic and atraumatic.     Mouth/Throat:     Pharynx: No oropharyngeal exudate.  Eyes:     Conjunctiva/sclera: Conjunctivae normal.     Pupils: Pupils are equal, round, and reactive to light.  Cardiovascular:     Rate and Rhythm: Normal rate and regular rhythm.     Heart sounds: Normal heart sounds.  Pulmonary:     Effort: Pulmonary effort is normal.     Breath sounds: Normal breath sounds.  Abdominal:     General: Bowel sounds are normal.     Palpations: Abdomen is soft.  Musculoskeletal:     Cervical back: Normal range of motion and neck supple.     Right lower leg: No edema.     Left lower leg: No edema.  Skin:    General: Skin is warm and dry.  Neurological:     Mental Status: She is alert. She is disoriented.  Psychiatric:        Mood and Affect: Mood normal.     Labs reviewed: Basic Metabolic Panel: Recent Labs    01/05/23 0947 06/22/23 0911 07/26/23 1425 08/03/23 0853  NA 144 138 138 142  K 4.3 4.2 3.6 3.6  CL 111* 102 102 110  CO2 22 27 25 24   GLUCOSE 94 103* 94 86  BUN 21 22 43* 26*  CREATININE 1.72* 1.32* 2.72* 1.75*  CALCIUM  9.8 9.8 9.8 9.4  MG 2.5  --   --   --   TSH 1.13  --   --   --    Liver Function Tests: Recent Labs    01/05/23 0947 06/22/23 0911 07/26/23 1425  AST 25 17 63*  ALT 16 7 20   BILITOT 0.6 0.4 0.4  PROT 7.1 6.8 7.1   No results for input(s): LIPASE, AMYLASE in the last 8760 hours. No results for input(s): AMMONIA in the last 8760 hours. CBC: Recent Labs    01/05/23 0947 06/22/23 0911 07/26/23 1425  WBC 8.3 8.2 9.5  NEUTROABS 5,503 4,986 6,213  HGB 12.6  10.7* 10.9*  HCT 40.2 36.1 36.5  MCV 87.6 80.8 78.3*  PLT 166 231 195   Lipid Panel: Recent Labs    01/05/23 0947 06/22/23 0911 07/26/23 1425  CHOL 165 244* 130  HDL 80 69 61  LDLCALC 69 152* 53  TRIG 82 116 82  CHOLHDL 2.1 3.5 2.1   TSH: Recent Labs    01/05/23 0947  TSH 1.13   A1C: Lab Results  Component Value Date   HGBA1C 5.9 (H) 06/22/2023     Assessment/Plan  Iron deficiency anemia, unspecified iron deficiency anemia type Assessment & Plan: Continues on iron supplement Follow cbc  Orders: -     CBC with Differential/Platelet  Essential hypertension Assessment & Plan: Well controlled on benicar     Hypothyroidism, unspecified type Assessment & Plan: Tsh at goal in Dec Continue synthroid  50 mcg  Orders: -     TSH  Alzheimer's dementia with behavioral disturbance (HCC) Assessment & Plan: Progression in cognitive decline with decreased comprehension and mobility. Requires assistance with bathing and dressing, but self-manages feeding. Sporadic mood and sleep disturbances. - Discuss potential use of medications for mood and sleep disturbances with family if symptoms become recurrent. - Offer referral for home evaluation by a therapist for medical devices if needed in the future.   CKD stage G3b/A2, GFR 30-44 and albumin creatinine ratio 30-299 mg/g (HCC) Assessment & Plan: Fluctuating kidney function, likely sensitive to hydration status.  - Encourage adequate hydration to maintain kidney function. - Monitor kidney function with blood work. - Consider referral to nephrology if kidney function continues to decline.  Orders: -     Comprehensive metabolic panel with GFR  Hyperglycemia Assessment & Plan: Continue dietary modifications  Follow up a1c  Orders: -     Hemoglobin A1c     Return in about 6 months (around 04/24/2024) for routine follow up.  Robbi Spells K. Caro BODILY [facility] [phone]

## 2023-10-25 NOTE — Assessment & Plan Note (Signed)
Well controlled on benicar

## 2023-10-25 NOTE — Assessment & Plan Note (Signed)
 Progression in cognitive decline with decreased comprehension and mobility. Requires assistance with bathing and dressing, but self-manages feeding. Sporadic mood and sleep disturbances. - Discuss potential use of medications for mood and sleep disturbances with family if symptoms become recurrent. - Offer referral for home evaluation by a therapist for medical devices if needed in the future.

## 2023-10-26 LAB — CBC WITH DIFFERENTIAL/PLATELET
Absolute Lymphocytes: 2236 {cells}/uL (ref 850–3900)
Absolute Monocytes: 671 {cells}/uL (ref 200–950)
Basophils Absolute: 17 {cells}/uL (ref 0–200)
Basophils Relative: 0.2 %
Eosinophils Absolute: 86 {cells}/uL (ref 15–500)
Eosinophils Relative: 1 %
HCT: 41.4 % (ref 35.0–45.0)
Hemoglobin: 13.4 g/dL (ref 11.7–15.5)
MCH: 28.9 pg (ref 27.0–33.0)
MCHC: 32.4 g/dL (ref 32.0–36.0)
MCV: 89.2 fL (ref 80.0–100.0)
MPV: 9.8 fL (ref 7.5–12.5)
Monocytes Relative: 7.8 %
Neutro Abs: 5590 {cells}/uL (ref 1500–7800)
Neutrophils Relative %: 65 %
Platelets: 152 Thousand/uL (ref 140–400)
RBC: 4.64 Million/uL (ref 3.80–5.10)
RDW: 17.4 % — ABNORMAL HIGH (ref 11.0–15.0)
Total Lymphocyte: 26 %
WBC: 8.6 Thousand/uL (ref 3.8–10.8)

## 2023-10-26 LAB — HEMOGLOBIN A1C
Hgb A1c MFr Bld: 5.6 % (ref <5.7)
Mean Plasma Glucose: 114 mg/dL
eAG (mmol/L): 6.3 mmol/L

## 2023-10-26 LAB — COMPREHENSIVE METABOLIC PANEL WITH GFR
AG Ratio: 1.5 (calc) (ref 1.0–2.5)
ALT: 26 U/L (ref 6–29)
AST: 44 U/L — ABNORMAL HIGH (ref 10–35)
Albumin: 4 g/dL (ref 3.6–5.1)
Alkaline phosphatase (APISO): 65 U/L (ref 37–153)
BUN/Creatinine Ratio: 13 (calc) (ref 6–22)
BUN: 19 mg/dL (ref 7–25)
CO2: 21 mmol/L (ref 20–32)
Calcium: 8.9 mg/dL (ref 8.6–10.4)
Chloride: 109 mmol/L (ref 98–110)
Creat: 1.46 mg/dL — ABNORMAL HIGH (ref 0.60–0.95)
Globulin: 2.6 g/dL (ref 1.9–3.7)
Glucose, Bld: 82 mg/dL (ref 65–139)
Potassium: 3.7 mmol/L (ref 3.5–5.3)
Sodium: 140 mmol/L (ref 135–146)
Total Bilirubin: 0.5 mg/dL (ref 0.2–1.2)
Total Protein: 6.6 g/dL (ref 6.1–8.1)
eGFR: 36 mL/min/1.73m2 — ABNORMAL LOW (ref >=60)

## 2023-10-26 LAB — TSH: TSH: 3.19 m[IU]/L (ref 0.40–4.50)

## 2023-10-27 ENCOUNTER — Ambulatory Visit: Payer: Self-pay | Admitting: Nurse Practitioner

## 2023-10-30 ENCOUNTER — Other Ambulatory Visit: Payer: Self-pay | Admitting: Nurse Practitioner

## 2023-10-30 DIAGNOSIS — Z79899 Other long term (current) drug therapy: Secondary | ICD-10-CM

## 2023-10-30 DIAGNOSIS — I10 Essential (primary) hypertension: Secondary | ICD-10-CM

## 2023-11-08 ENCOUNTER — Telehealth: Payer: Self-pay

## 2023-11-08 MED ORDER — TRAZODONE HCL 50 MG PO TABS: 50.0000 mg | ORAL_TABLET | Freq: Every day | ORAL | 1 refills | Status: DC

## 2023-11-08 NOTE — Telephone Encounter (Signed)
 I left a detailed voicemail with providers response

## 2023-11-08 NOTE — Telephone Encounter (Signed)
 Copied from CRM [id number]. Topic: Clinical - Medication Question >> Nov 08, 2023  9:29 AM Cherylann RAMAN wrote: Reason for CRM: [name], daughter, states her and her father has agreed to having medication for the patient for sleep and depression that provider Harlene has in mind for the patient. Please contact Kelly at [phone]. Medication to sent to  [id number] - [place], [place] - [address] [place] [address] Phone: [phone] Fax: [phone] Hours: Not open 24 hours

## 2023-11-08 NOTE — Telephone Encounter (Signed)
 Trazodone  sent to the pharmacy- would recommend starting half tablet for 2 weeks then increasing to whole to help with mood and sleep

## 2023-11-09 ENCOUNTER — Other Ambulatory Visit: Payer: Self-pay

## 2023-11-09 DIAGNOSIS — I10 Essential (primary) hypertension: Secondary | ICD-10-CM

## 2023-11-09 DIAGNOSIS — Z79899 Other long term (current) drug therapy: Secondary | ICD-10-CM

## 2023-11-09 MED ORDER — OLMESARTAN MEDOXOMIL 40 MG PO TABS: ORAL_TABLET | ORAL | 1 refills | Status: AC

## 2023-12-13 ENCOUNTER — Telehealth: Payer: Self-pay

## 2023-12-13 ENCOUNTER — Other Ambulatory Visit: Payer: Self-pay | Admitting: *Deleted

## 2023-12-13 DIAGNOSIS — E782 Mixed hyperlipidemia: Secondary | ICD-10-CM

## 2023-12-13 DIAGNOSIS — E039 Hypothyroidism, unspecified: Secondary | ICD-10-CM

## 2023-12-13 MED ORDER — ROSUVASTATIN CALCIUM 40 MG PO TABS: 40.0000 mg | ORAL_TABLET | Freq: Every day | ORAL | 1 refills | Status: DC

## 2023-12-13 MED ORDER — ROSUVASTATIN CALCIUM 40 MG PO TABS: ORAL_TABLET | ORAL | 1 refills | Status: DC

## 2023-12-13 MED ORDER — LEVOTHYROXINE SODIUM 50 MCG PO TABS: ORAL_TABLET | ORAL | 1 refills | Status: AC

## 2023-12-13 MED ORDER — ROSUVASTATIN CALCIUM 40 MG PO TABS: 40.0000 mg | ORAL_TABLET | Freq: Every day | ORAL | 1 refills | Status: AC

## 2023-12-13 NOTE — Telephone Encounter (Signed)
 Copied from CRM [id number]. Topic: Clinical - Prescription Issue >> Dec 13, 2023 11:13 AM Farrel B wrote: Reason for CRM: Received call advising rosuvastatin  (CRESTOR ) 40 MG tablet [[id number]] can be sent as soon as possible due to pt switching providers and pt is running behind and running out of medicatons please see contact number below to speak with pharmacy

## 2023-12-13 NOTE — Telephone Encounter (Signed)
[facility] pharmacy requested refill.  

## 2023-12-13 NOTE — Telephone Encounter (Signed)
 Rx wasn't being sent by [facility], kept printing. Called [facility] and spoke with Alfonso and she stated that Rx was received.

## 2023-12-20 ENCOUNTER — Ambulatory Visit: Admitting: Adult Health

## 2023-12-20 ENCOUNTER — Encounter: Payer: Self-pay | Admitting: Adult Health

## 2023-12-20 VITALS — BP 122/70 | HR 65 | Temp 97.9°F | Ht 60.0 in | Wt 117.4 lb

## 2023-12-20 DIAGNOSIS — G4700 Insomnia, unspecified: Secondary | ICD-10-CM

## 2023-12-20 DIAGNOSIS — E039 Hypothyroidism, unspecified: Secondary | ICD-10-CM | POA: Diagnosis not present

## 2023-12-20 DIAGNOSIS — E782 Mixed hyperlipidemia: Secondary | ICD-10-CM

## 2023-12-20 DIAGNOSIS — R350 Frequency of micturition: Secondary | ICD-10-CM

## 2023-12-20 DIAGNOSIS — I10 Essential (primary) hypertension: Secondary | ICD-10-CM

## 2023-12-20 LAB — POCT URINALYSIS DIPSTICK
Bilirubin, UA: NEGATIVE
Glucose, UA: NEGATIVE
Ketones, UA: NEGATIVE
Leukocytes, UA: NEGATIVE
Nitrite, UA: NEGATIVE
Protein, UA: POSITIVE — AB
Spec Grav, UA: 1.015 (ref 1.010–1.025)
Urobilinogen, UA: 1 U/dL
pH, UA: 6 (ref 5.0–8.0)

## 2023-12-20 NOTE — Progress Notes (Unsigned)
 [facility] clinic  Provider:  Jereld Serum DNP  Code Status:   Goals of Care:     04/09/2023    9:26 AM  Advanced Directives  Does Patient Have a Medical Advance Directive? Yes  Type of Estate Agent of [place];Living will;Out of facility DNR (pink MOST or yellow form)  Does patient want to make changes to medical advance directive? No - Patient declined  Copy of Healthcare Power of Attorney in Chart? No - copy requested     Chief Complaint  Patient presents with   Chills    Also reports an exhaustion noise, out of breath noiseand not able to sleep.    HPI: Patient is a 78 y.o. female seen today for an acute visit for Discussed the use of AI scribe software for clinical note transcription with the patient, who gave verbal consent to proceed.  History of Present Illness      Past Medical History:  Diagnosis Date   GERD (gastroesophageal reflux disease)    Hyperlipidemia    Hypertension    Low kidney function    Other abnormal glucose    Prediabetes    Unspecified hypothyroidism    Unspecified vitamin D  deficiency     Past Surgical History:  Procedure Laterality Date   ABDOMINAL HYSTERECTOMY     CARPAL TUNNEL RELEASE Bilateral 2001   EYE SURGERY     NASAL SEPTUM SURGERY     TUBAL LIGATION      Allergies[1]  Outpatient Encounter Medications as of 12/20/2023  Medication Sig   levothyroxine  (SYNTHROID ) 50 MCG tablet TAKE 1 TABLET EVERY DAY BEFORE BREAKFAST   olmesartan  (BENICAR ) 40 MG tablet TAKE 1 TABLET EVERY DAY FOR BLOOD PRESSURE   rosuvastatin  (CRESTOR ) 40 MG tablet Take 1 tablet (40 mg total) by mouth daily.   traZODone  (DESYREL ) 50 MG tablet Take 1 tablet (50 mg total) by mouth at bedtime. (Patient not taking: Reported on 12/20/2023)   No facility-administered encounter medications on file as of 12/20/2023.    Review of Systems:  Review of Systems  Constitutional:  Positive for appetite change. Negative for chills, fatigue  and fever.  HENT:  Negative for congestion, hearing loss, rhinorrhea and sore throat.   Eyes: Negative.   Respiratory:  Positive for shortness of breath. Negative for cough and wheezing.   Cardiovascular:  Negative for chest pain, palpitations and leg swelling.  Gastrointestinal:  Negative for abdominal pain, constipation, diarrhea, nausea and vomiting.  Genitourinary:  Negative for dysuria.  Musculoskeletal:  Negative for arthralgias, back pain and myalgias.  Skin:  Negative for color change, rash and wound.  Neurological:  Negative for dizziness, weakness and headaches.  Psychiatric/Behavioral:  Positive for sleep disturbance. Negative for behavioral problems. The patient is not nervous/anxious.     Health Maintenance  Topic Date Due   Zoster Vaccines- Shingrix (1 of 2) Never done   COVID-19 Vaccine (3 - Moderna risk series) 09/29/2019   Influenza Vaccine  08/06/2023   Medicare Annual Wellness (AWV)  01/05/2024   Pneumococcal Vaccine: 50+ Years (2 of 2 - PPSV23, PCV20, or PCV21) 07/25/2024 (Originally 10/03/2015)   Bone Density Scan  Completed   Meningococcal B Vaccine  Aged Out   DTaP/Tdap/Td  Discontinued   Colonoscopy  Discontinued   Fecal DNA (Cologuard)  Discontinued    Physical Exam: Vitals:   12/20/23 1343  BP: 122/70  Pulse: 65  Temp: 97.9 F (36.6 C)  SpO2: 98%  Weight: 117 lb 6.4 oz (53.3 kg)  Height: 5' (1.524 m)   Body mass index is 22.93 kg/m. Physical Exam Constitutional:      Appearance: Normal appearance.  HENT:     Head: Normocephalic and atraumatic.     Nose: Nose normal.     Mouth/Throat:     Mouth: Mucous membranes are moist.  Eyes:     Conjunctiva/sclera: Conjunctivae normal.  Cardiovascular:     Rate and Rhythm: Normal rate and regular rhythm.  Pulmonary:     Effort: Pulmonary effort is normal.     Breath sounds: Normal breath sounds.  Abdominal:     General: Bowel sounds are normal.     Palpations: Abdomen is soft.  Musculoskeletal:         General: Normal range of motion.     Cervical back: Normal range of motion.  Skin:    General: Skin is warm and dry.  Neurological:     General: No focal deficit present.     Mental Status: She is alert and oriented to person, place, and time.  Psychiatric:        Mood and Affect: Mood normal.        Behavior: Behavior normal.     Labs reviewed: Basic Metabolic Panel: Recent Labs    01/05/23 0947 06/22/23 0911 07/26/23 1425 08/03/23 0853 10/25/23 1201  NA 144   < > 138 142 140  K 4.3   < > 3.6 3.6 3.7  CL 111*   < > 102 110 109  CO2 22   < > 25 24 21   GLUCOSE 94   < > 94 86 82  BUN 21   < > 43* 26* 19  CREATININE 1.72*   < > 2.72* 1.75* 1.46*  CALCIUM  9.8   < > 9.8 9.4 8.9  MG 2.5  --   --   --   --   TSH 1.13  --   --   --  3.19   < > = values in this interval not displayed.   Liver Function Tests: Recent Labs    06/22/23 0911 07/26/23 1425 10/25/23 1201  AST 17 63* 44*  ALT 7 20 26   BILITOT 0.4 0.4 0.5  PROT 6.8 7.1 6.6   No results for input(s): LIPASE, AMYLASE in the last 8760 hours. No results for input(s): AMMONIA in the last 8760 hours. CBC: Recent Labs    06/22/23 0911 07/26/23 1425 10/25/23 1201  WBC 8.2 9.5 8.6  NEUTROABS 4,986 6,213 5,590  HGB 10.7* 10.9* 13.4  HCT 36.1 36.5 41.4  MCV 80.8 78.3* 89.2  PLT 231 195 152   Lipid Panel: Recent Labs    01/05/23 0947 06/22/23 0911 07/26/23 1425  CHOL 165 244* 130  HDL 80 69 61  LDLCALC 69 152* 53  TRIG 82 116 82  CHOLHDL 2.1 3.5 2.1   Lab Results  Component Value Date   HGBA1C 5.6 10/25/2023    Procedures since last visit: No results found.  Assessment/Plan     Labs/tests ordered:   POC urinalysis, tsh, BMP, CBC   No follow-ups on file.  Jaevon Paras Medina-Vargas, NP    [1]  Allergies Allergen Reactions   Lopid [Gemfibrozil]     headache   Morphine And Codeine Other (See Comments)    Reaction: unknown    Nickel Other (See Comments)    Reaction: unknown     Xanax  [Alprazolam ]    Latex Rash

## 2023-12-21 LAB — CBC WITH DIFFERENTIAL/PLATELET
Absolute Lymphocytes: 1958 {cells}/uL (ref 850–3900)
Absolute Monocytes: 1221 {cells}/uL — ABNORMAL HIGH (ref 200–950)
Basophils Absolute: 22 {cells}/uL (ref 0–200)
Basophils Relative: 0.2 %
Eosinophils Absolute: 99 {cells}/uL (ref 15–500)
Eosinophils Relative: 0.9 %
HCT: 44.4 % (ref 35.9–46.0)
Hemoglobin: 14.3 g/dL (ref 11.7–15.5)
MCH: 28.7 pg (ref 27.0–33.0)
MCHC: 32.2 g/dL (ref 31.6–35.4)
MCV: 89 fL (ref 81.4–101.7)
MPV: 10.3 fL (ref 7.5–12.5)
Monocytes Relative: 11.1 %
Neutro Abs: 7700 {cells}/uL (ref 1500–7800)
Neutrophils Relative %: 70 %
Platelets: 170 Thousand/uL (ref 140–400)
RBC: 4.99 Million/uL (ref 3.80–5.10)
RDW: 13.7 % (ref 11.0–15.0)
Total Lymphocyte: 17.8 %
WBC: 11 Thousand/uL — ABNORMAL HIGH (ref 3.8–10.8)

## 2023-12-21 LAB — BASIC METABOLIC PANEL WITH GFR
BUN/Creatinine Ratio: 11 (calc) (ref 6–22)
BUN: 18 mg/dL (ref 7–25)
CO2: 22 mmol/L (ref 20–32)
Calcium: 8.9 mg/dL (ref 8.6–10.4)
Chloride: 105 mmol/L (ref 98–110)
Creat: 1.61 mg/dL — ABNORMAL HIGH (ref 0.60–0.95)
Glucose, Bld: 98 mg/dL (ref 65–139)
Potassium: 3.1 mmol/L — ABNORMAL LOW (ref 3.5–5.3)
Sodium: 139 mmol/L (ref 135–146)
eGFR: 32 mL/min/1.73m2 — ABNORMAL LOW (ref >=60)

## 2023-12-21 LAB — URINE CULTURE
MICRO NUMBER:: 17356569
SPECIMEN QUALITY:: ADEQUATE

## 2023-12-21 LAB — TSH: TSH: 4.69 m[IU]/L — ABNORMAL HIGH (ref 0.40–4.50)

## 2023-12-24 ENCOUNTER — Ambulatory Visit: Payer: Self-pay | Admitting: Adult Health

## 2023-12-24 ENCOUNTER — Other Ambulatory Visit: Payer: Self-pay | Admitting: Adult Health

## 2023-12-24 DIAGNOSIS — E039 Hypothyroidism, unspecified: Secondary | ICD-10-CM

## 2023-12-24 DIAGNOSIS — E876 Hypokalemia: Secondary | ICD-10-CM

## 2023-12-24 MED ORDER — LEVOTHYROXINE SODIUM 75 MCG PO TABS: 75.0000 ug | ORAL_TABLET | Freq: Every day | ORAL | 1 refills | Status: AC

## 2023-12-24 MED ORDER — POTASSIUM CHLORIDE CRYS ER 20 MEQ PO TBCR: 20.0000 meq | EXTENDED_RELEASE_TABLET | Freq: Once | ORAL | 0 refills | Status: DC

## 2023-12-24 MED ORDER — POTASSIUM CHLORIDE CRYS ER 20 MEQ PO TBCR: 40.0000 meq | EXTENDED_RELEASE_TABLET | Freq: Once | ORAL | 0 refills | Status: DC

## 2023-12-24 NOTE — Addendum Note (Signed)
 Addended by: PHYLLIS MUTTER C on: 12/24/2023 02:45 PM   Modules accepted: Orders

## 2023-12-24 NOTE — Telephone Encounter (Signed)
 Phone call from [facility] in regard to prescription for Potassium  sent today. Needing clarification: Instruction state 1 tab my mouth once. 2 tablets were sent does she take both my mouth once or 1 tab today and the other tab the next? Please advise

## 2023-12-24 NOTE — Progress Notes (Signed)
-    urine culture is negative for UTI,  -  K 3.1, low, ordered KCL and sent to pharmacy @ [facility] in [place] -  Tsh elevated, increased Levothyroxine  to 75 mcg, [facility] -  discussed labs with daughter

## 2023-12-24 NOTE — Telephone Encounter (Signed)
-    Patient should take 2 tabs of Kcl 20 meq = 40 meq by  mouth only today, sent clarified order to pharmacy.

## 2024-01-04 ENCOUNTER — Encounter: Payer: Medicare HMO | Admitting: Nurse Practitioner

## 2024-01-07 ENCOUNTER — Encounter: Payer: Medicare HMO | Admitting: Nurse Practitioner

## 2024-01-11 ENCOUNTER — Telehealth: Payer: Self-pay | Admitting: *Deleted

## 2024-01-11 ENCOUNTER — Ambulatory Visit (INDEPENDENT_AMBULATORY_CARE_PROVIDER_SITE_OTHER): Payer: Self-pay | Admitting: Nurse Practitioner

## 2024-01-11 ENCOUNTER — Encounter: Payer: Self-pay | Admitting: Nurse Practitioner

## 2024-01-11 DIAGNOSIS — Z0000 Encounter for general adult medical examination without abnormal findings: Secondary | ICD-10-CM | POA: Diagnosis not present

## 2024-01-11 NOTE — Progress Notes (Signed)
"  °  This service is provided via telemedicine  No vital signs collected/recorded due to the encounter was a telemedicine visit.   Location of patient (ex: home, work):  Home  Patient consents to a telephone visit:  Yes  Location of the provider (ex: office, home):  Office [place].   Name of any referring provider:  na  Names of all persons participating in the telemedicine service and their role in the encounter:  Stacy Wallace, Patient , [name], daughter,  [name], CMA, Harlene An, NP  Time spent on call:  7:11  "

## 2024-01-11 NOTE — Telephone Encounter (Signed)
 [name], Stacy Wallace are scheduled for a virtual visit with your provider today.    Just as we do with appointments in the office, we must obtain your consent to participate.  Your consent will be active for this visit and any virtual visit you Stacy Wallace have with one of our providers in the next 365 days.    If you have a MyChart account, I can also send a copy of this consent to you electronically.  All virtual visits are billed to your insurance company just like a traditional visit in the office.  As this is a virtual visit, video technology does not allow for your provider to perform a traditional examination.  This Stacy Wallace limit your provider's ability to fully assess your condition.  If your provider identifies any concerns that need to be evaluated in person or the need to arrange testing such as labs, EKG, etc, we will make arrangements to do so.    Although advances in technology are sophisticated, we cannot ensure that it will always work on either your end or our end.  If the connection with a video visit is poor, we Stacy Wallace have to switch to a telephone visit.  With either a video or telephone visit, we are not always able to ensure that we have a secure connection.   I need to obtain your verbal consent now.   Are you willing to proceed with your visit today?   Stacy Wallace has provided verbal consent on 01/11/2024 for a virtual visit (video or telephone).   Stacy Wallace, CMA 01/11/2024  9:23 AM

## 2024-01-11 NOTE — Patient Instructions (Addendum)
 Ms. Koral,  Thank you for taking the time for your Medicare Wellness Visit. I appreciate your continued commitment to your health goals. Please review the care plan we discussed, and feel free to reach out if I can assist you further.  Please note that Annual Wellness Visits do not include a physical exam. Some assessments may be limited, especially if the visit was conducted virtually. If needed, we may recommend an in-person follow-up with your provider.  Ongoing Care Seeing your primary care provider every 3 to 6 months helps us  monitor your health and provide consistent, personalized care.   Referrals If a referral was made during today's visit and you haven't received any updates within two weeks, please contact the referred provider directly to check on the status.  Recommended Screenings:  Health Maintenance  Topic Date Due   Zoster (Shingles) Vaccine (1 of 2) Never done   COVID-19 Vaccine (3 - Moderna risk series) 09/29/2019   Flu Shot  08/06/2023   Medicare Annual Wellness Visit  01/05/2024   Pneumococcal Vaccine for age over 30 (2 of 2 - PPSV23, PCV20, or PCV21) 07/25/2024*   Osteoporosis screening with Bone Density Scan  Completed   Meningitis B Vaccine  Aged Out   DTaP/Tdap/Td vaccine  Discontinued   Colon Cancer Screening  Discontinued   Cologuard (Stool DNA test)  Discontinued  *Topic was postponed. The date shown is not the original due date.       01/11/2024    9:19 AM  Advanced Directives  Does Patient Have a Medical Advance Directive? Yes  Type of Advance Directive Healthcare Power of Attorney  Does patient want to make changes to medical advance directive? No - Patient declined  Copy of Healthcare Power of Attorney in Chart? Yes - validated most recent copy scanned in chart (See row information)    Vision: Annual vision screenings are recommended for early detection of glaucoma, cataracts, and diabetic retinopathy. These exams can also reveal signs of chronic  conditions such as diabetes and high blood pressure.  Dental: Annual dental screenings help detect early signs of oral cancer, gum disease, and other conditions linked to overall health, including heart disease and diabetes.  Please see the attached documents for additional preventive care recommendations.   To get FLU shot.

## 2024-01-11 NOTE — Progress Notes (Signed)
 "  Chief Complaint  Patient presents with   Medicare Wellness    AWV     Subjective:   Stacy Wallace is a 79 y.o. female who presents for a Medicare Annual Wellness Visit.  Visit info / Clinical Intake: Medicare Wellness Visit Type:: Subsequent Annual Wellness Visit Persons participating in visit and providing information:: patient & caregiver Medicare Wellness Visit Mode:: Video Since this visit was completed virtually, some vitals may be partially provided or unavailable. Missing vitals are due to the limitations of the virtual format.: Unable to obtain vitals - no equipment If Telephone or Video please confirm:: I connected with patient using audio/video enable telemedicine. I verified patient identity with two identifiers, discussed telehealth limitations, and patient agreed to proceed. Patient Location:: Home Provider Location:: Office [facility] Interpreter Needed?: No Pre-visit prep was completed: no AWV questionnaire completed by patient prior to visit?: no Living arrangements:: lives with spouse/significant other Patient's Overall Health Status Rating: good Typical amount of pain: none Does pain affect daily life?: no Are you currently prescribed opioids?: no  Dietary Habits and Nutritional Risks How many meals a day?: 3 Eats fruit and vegetables daily?: yes Most meals are obtained by: having others provide food In the last 2 weeks, have you had any of the following?: none Diabetic:: no  Functional Status Activities of Daily Living (to include ambulation/medication): (!) Needs Assist Feeding: Needs assistance Dressing/Grooming: Dependent Bathing: Dependent Toileting: Dependent Transfer: Dependent Ambulation: Needs Assistance Medication Administration: Dependent Is this a change from baseline?: Pre-admission baseline Home Management (perform basic housework or laundry): Dependent Manage your own finances?: (!) no Primary transportation is: family /  friends Concerns about vision?: no *vision screening is required for WTM* Concerns about hearing?: no  Fall Screening Falls in the past year?: 0 Number of falls in past year: 0 Was there Wallace injury with Fall?: 0 Fall Risk Category Calculator: 0 Patient Fall Risk Level: Low Fall Risk  Fall Risk Patient at Risk for Falls Due to: Impaired balance/gait Fall risk Follow up: Falls evaluation completed  Home and Transportation Safety: All rugs have non-skid backing?: N/A, no rugs All stairs or steps have railings?: yes Grab bars in the bathtub or shower?: yes Have non-skid surface in bathtub or shower?: yes Good home lighting?: yes Regular seat belt use?: yes Hospital stays in the last year:: no  Cognitive Assessment Difficulty concentrating, remembering, or making decisions? : yes Will 6CIT or Mini Cog be Completed: no 6CIT or Mini Cog Declined: patient has a diagnosis of dementia or cognitive impairment  Advance Directives (For Healthcare) Does Patient Have a Medical Advance Directive?: Yes Does patient want to make changes to medical advance directive?: No - Patient declined Type of Advance Directive: Healthcare Power of Attorney Copy of Healthcare Power of Attorney in Chart?: Yes - validated most recent copy scanned in chart (See row information) Copy of Living Will in Chart?: No - copy requested Out of facility DNR (pink MOST or yellow form) in Chart? (Ambulatory ONLY): No - copy requested  Reviewed/Updated  Reviewed/Updated: Reviewed All (Medical, Surgical, Family, Medications, Allergies, Care Teams, Patient Goals)    Allergies (verified) Lopid [gemfibrozil], Morphine and codeine, Nickel, Xanax  [alprazolam ], and Latex   Current Medications (verified) Outpatient Encounter Medications as of 01/11/2024  Medication Sig   levothyroxine  (SYNTHROID ) 75 MCG tablet Take 1 tablet (75 mcg total) by mouth daily before breakfast.   olmesartan  (BENICAR ) 40 MG tablet TAKE 1 TABLET EVERY  DAY FOR BLOOD PRESSURE   rosuvastatin  (  CRESTOR ) 40 MG tablet Take 1 tablet (40 mg total) by mouth daily.   [DISCONTINUED] potassium chloride  SA (KLOR-CON  M) 20 MEQ tablet Take 2 tablets (40 mEq total) by mouth once. (Patient not taking: Reported on 01/11/2024)   [DISCONTINUED] traZODone  (DESYREL ) 50 MG tablet Take 1 tablet (50 mg total) by mouth at bedtime. (Patient not taking: Reported on 01/11/2024)   No facility-administered encounter medications on file as of 01/11/2024.    History: Past Medical History:  Diagnosis Date   GERD (gastroesophageal reflux disease)    Hyperlipidemia    Hypertension    Low kidney function    Other abnormal glucose    Prediabetes    Unspecified hypothyroidism    Unspecified vitamin D  deficiency    Past Surgical History:  Procedure Laterality Date   ABDOMINAL HYSTERECTOMY     CARPAL TUNNEL RELEASE Bilateral 2001   EYE SURGERY     NASAL SEPTUM SURGERY     TUBAL LIGATION     Family History  Problem Relation Age of Onset   Hypertension Mother    Hyperlipidemia Mother    Emphysema Mother    Heart disease Father    Alcohol abuse Father    Hyperlipidemia Father    Hypertension Father    Heart disease Sister    Stroke Sister    Depression Sister    Heart disease Brother    Social History   Occupational History   Not on file  Tobacco Use   Smoking status: Former    Current packs/day: 0.00    Types: Cigarettes    Quit date: 03/20/1988    Years since quitting: 35.8   Smokeless tobacco: Never  Substance and Sexual Activity   Alcohol use: Yes    Comment: Pt no longer drinks alcohol.   Drug use: Not on file   Sexual activity: Not on file   Tobacco Counseling Counseling given: Not Answered  SDOH Screenings   Food Insecurity: No Food Insecurity (01/11/2024)  Housing: Unknown (01/11/2024)  Transportation Needs: No Transportation Needs (01/11/2024)  Utilities: Not At Risk (01/11/2024)  Depression (PHQ2-9): Low Risk (01/11/2024)  Social Connections:  Socially Isolated (04/09/2023)  Tobacco Use: Medium Risk (01/11/2024)   See flowsheets for full screening details  Depression Screen PHQ 2 & 9 Depression Scale- Over the past 2 weeks, how often have you been bothered by any of the following problems? Little interest or pleasure in doing things: 0 Feeling down, depressed, or hopeless (PHQ Adolescent also includes...irritable): 0 PHQ-2 Total Score: 0     Goals Addressed   None          Objective:    There were no vitals filed for this visit. There is no height or weight on file to calculate BMI.  Hearing/Vision screen Vision Screening - Comments:: Does Not See Eye Dr Immunizations and Health Maintenance Health Maintenance  Topic Date Due   Zoster Vaccines- Shingrix (1 of 2) Never done   COVID-19 Vaccine (3 - Moderna risk series) 09/29/2019   Influenza Vaccine  08/06/2023   Medicare Annual Wellness (AWV)  01/05/2024   Pneumococcal Vaccine: 50+ Years (2 of 2 - PPSV23, PCV20, or PCV21) 07/25/2024 (Originally 10/03/2015)   Bone Density Scan  Completed   Meningococcal B Vaccine  Aged Out   DTaP/Tdap/Td  Discontinued   Colonoscopy  Discontinued   Fecal DNA (Cologuard)  Discontinued        Assessment/Plan:  This is a routine wellness examination for Stacy Wallace.  Patient Care Team: Caro Raisin  K, NP as PCP - General (Geriatric Medicine)  I have personally reviewed and noted the following in the patients chart:   Medical and social history Use of alcohol, tobacco or illicit drugs  Current medications and supplements including opioid prescriptions. Functional ability and status Nutritional status Physical activity Advanced directives List of other physicians Hospitalizations, surgeries, and ER visits in previous 12 months Vitals Screenings to include cognitive, depression, and falls Referrals and appointments  No orders of the defined types were placed in this encounter.  In addition, I have reviewed and discussed  with patient certain preventive protocols, quality metrics, and best practice recommendations. A written personalized care plan for preventive services as well as general preventive health recommendations were provided to patient.   Stacy MARLA An, NP   01/11/2024   No follow-ups on file.  After Visit Summary: (MyChart) Due to this being a telephonic visit, the after visit summary with patients personalized plan was offered to patient via MyChart   "

## 2024-04-24 ENCOUNTER — Ambulatory Visit: Payer: Self-pay | Admitting: Nurse Practitioner

## 2025-01-11 ENCOUNTER — Ambulatory Visit: Admitting: Nurse Practitioner
